# Patient Record
Sex: Female | Born: 1935 | Race: White | Hispanic: No | Smoking: Former smoker
Health system: Southern US, Community
[De-identification: ages and names within clinical notes are randomized; demographics above are authoritative.]

## PROBLEM LIST (undated history)

## (undated) DIAGNOSIS — Z8673 Personal history of transient ischemic attack (TIA), and cerebral infarction without residual deficits: Secondary | ICD-10-CM

## (undated) DIAGNOSIS — H269 Unspecified cataract: Secondary | ICD-10-CM

## (undated) DIAGNOSIS — I714 Abdominal aortic aneurysm, without rupture, unspecified: Secondary | ICD-10-CM

## (undated) DIAGNOSIS — S2232XA Fracture of one rib, left side, initial encounter for closed fracture: Secondary | ICD-10-CM

## (undated) DIAGNOSIS — I10 Essential (primary) hypertension: Secondary | ICD-10-CM

## (undated) DIAGNOSIS — I517 Cardiomegaly: Secondary | ICD-10-CM

## (undated) DIAGNOSIS — M8580 Other specified disorders of bone density and structure, unspecified site: Secondary | ICD-10-CM

## (undated) DIAGNOSIS — Z91013 Allergy to seafood: Secondary | ICD-10-CM

## (undated) DIAGNOSIS — E785 Hyperlipidemia, unspecified: Secondary | ICD-10-CM

## (undated) DIAGNOSIS — F32A Depression, unspecified: Secondary | ICD-10-CM

## (undated) DIAGNOSIS — F0390 Unspecified dementia without behavioral disturbance: Secondary | ICD-10-CM

## (undated) DIAGNOSIS — F329 Major depressive disorder, single episode, unspecified: Secondary | ICD-10-CM

## (undated) DIAGNOSIS — I6529 Occlusion and stenosis of unspecified carotid artery: Secondary | ICD-10-CM

## (undated) DIAGNOSIS — I447 Left bundle-branch block, unspecified: Secondary | ICD-10-CM

## (undated) DIAGNOSIS — I5030 Unspecified diastolic (congestive) heart failure: Secondary | ICD-10-CM

## (undated) DIAGNOSIS — E119 Type 2 diabetes mellitus without complications: Secondary | ICD-10-CM

## (undated) DIAGNOSIS — K219 Gastro-esophageal reflux disease without esophagitis: Secondary | ICD-10-CM

## (undated) HISTORY — DX: Essential (primary) hypertension: I10

## (undated) HISTORY — DX: Left bundle-branch block, unspecified: I44.7

## (undated) HISTORY — PX: TUBAL LIGATION: SHX77

## (undated) HISTORY — DX: Depression, unspecified: F32.A

## (undated) HISTORY — DX: Other specified disorders of bone density and structure, unspecified site: M85.80

## (undated) HISTORY — DX: Fracture of one rib, left side, initial encounter for closed fracture: S22.32XA

## (undated) HISTORY — PX: ANKLE SURGERY: SHX546

## (undated) HISTORY — DX: Abdominal aortic aneurysm, without rupture: I71.4

## (undated) HISTORY — DX: Unspecified diastolic (congestive) heart failure: I50.30

## (undated) HISTORY — DX: Hyperlipidemia, unspecified: E78.5

## (undated) HISTORY — DX: Gastro-esophageal reflux disease without esophagitis: K21.9

## (undated) HISTORY — DX: Allergy to seafood: Z91.013

## (undated) HISTORY — DX: Occlusion and stenosis of unspecified carotid artery: I65.29

## (undated) HISTORY — DX: Unspecified dementia, unspecified severity, without behavioral disturbance, psychotic disturbance, mood disturbance, and anxiety: F03.90

## (undated) HISTORY — DX: Cardiomegaly: I51.7

## (undated) HISTORY — DX: Type 2 diabetes mellitus without complications: E11.9

## (undated) HISTORY — PX: JOINT REPLACEMENT: SHX530

## (undated) HISTORY — DX: Abdominal aortic aneurysm, without rupture, unspecified: I71.40

## (undated) HISTORY — DX: Major depressive disorder, single episode, unspecified: F32.9

## (undated) HISTORY — DX: Personal history of transient ischemic attack (TIA), and cerebral infarction without residual deficits: Z86.73

---

## 2008-03-04 HISTORY — PX: TRACHEOSTOMY: SUR1362

## 2008-10-28 ENCOUNTER — Encounter: Payer: Self-pay | Admitting: Internal Medicine

## 2009-02-28 ENCOUNTER — Encounter: Payer: Self-pay | Admitting: Internal Medicine

## 2009-04-28 ENCOUNTER — Encounter: Payer: Self-pay | Admitting: Internal Medicine

## 2009-08-04 ENCOUNTER — Encounter: Payer: Self-pay | Admitting: Internal Medicine

## 2009-08-14 ENCOUNTER — Encounter: Payer: Self-pay | Admitting: Internal Medicine

## 2009-09-01 DIAGNOSIS — I6529 Occlusion and stenosis of unspecified carotid artery: Secondary | ICD-10-CM

## 2009-09-01 HISTORY — DX: Occlusion and stenosis of unspecified carotid artery: I65.29

## 2009-09-18 ENCOUNTER — Ambulatory Visit: Payer: Self-pay | Admitting: Internal Medicine

## 2009-09-18 DIAGNOSIS — R5383 Other fatigue: Secondary | ICD-10-CM

## 2009-09-18 DIAGNOSIS — F329 Major depressive disorder, single episode, unspecified: Secondary | ICD-10-CM

## 2009-09-18 DIAGNOSIS — J309 Allergic rhinitis, unspecified: Secondary | ICD-10-CM | POA: Insufficient documentation

## 2009-09-18 DIAGNOSIS — R42 Dizziness and giddiness: Secondary | ICD-10-CM | POA: Insufficient documentation

## 2009-09-18 DIAGNOSIS — F0391 Unspecified dementia with behavioral disturbance: Secondary | ICD-10-CM

## 2009-09-18 DIAGNOSIS — I447 Left bundle-branch block, unspecified: Secondary | ICD-10-CM

## 2009-09-18 DIAGNOSIS — I10 Essential (primary) hypertension: Secondary | ICD-10-CM | POA: Insufficient documentation

## 2009-09-18 DIAGNOSIS — Z8679 Personal history of other diseases of the circulatory system: Secondary | ICD-10-CM | POA: Insufficient documentation

## 2009-09-18 DIAGNOSIS — E119 Type 2 diabetes mellitus without complications: Secondary | ICD-10-CM | POA: Insufficient documentation

## 2009-09-18 DIAGNOSIS — E785 Hyperlipidemia, unspecified: Secondary | ICD-10-CM

## 2009-09-18 DIAGNOSIS — R5381 Other malaise: Secondary | ICD-10-CM

## 2009-09-18 DIAGNOSIS — K219 Gastro-esophageal reflux disease without esophagitis: Secondary | ICD-10-CM | POA: Insufficient documentation

## 2009-09-18 DIAGNOSIS — N959 Unspecified menopausal and perimenopausal disorder: Secondary | ICD-10-CM | POA: Insufficient documentation

## 2009-09-19 LAB — CONVERTED CEMR LAB
AST: 16 units/L (ref 0–37)
Albumin: 3.5 g/dL (ref 3.5–5.2)
Alkaline Phosphatase: 91 units/L (ref 39–117)
Basophils Absolute: 0 10*3/uL (ref 0.0–0.1)
Bilirubin, Direct: 0.1 mg/dL (ref 0.0–0.3)
Calcium, Total (PTH): 9 mg/dL (ref 8.4–10.5)
Calcium: 8.9 mg/dL (ref 8.4–10.5)
Creatinine, Ser: 0.6 mg/dL (ref 0.4–1.2)
Eosinophils Absolute: 0.2 10*3/uL (ref 0.0–0.7)
Folate: 12.9 ng/mL
GFR calc non Af Amer: 104.98 mL/min (ref >60)
Glucose, Bld: 258 mg/dL — ABNORMAL HIGH (ref 70–99)
HDL: 39.2 mg/dL (ref >39.00)
Hemoglobin, Urine: NEGATIVE
Hemoglobin: 12 g/dL (ref 12.0–15.0)
LDL Cholesterol: 74 mg/dL (ref 0–99)
Leukocytes, UA: NEGATIVE
Lymphocytes Relative: 26.4 % (ref 12.0–46.0)
MCHC: 34.7 g/dL (ref 30.0–36.0)
Microalb Creat Ratio: 1 mg/g (ref 0.0–30.0)
Monocytes Relative: 9.4 % (ref 3.0–12.0)
Neutro Abs: 4.6 10*3/uL (ref 1.4–7.7)
Neutrophils Relative %: 60.4 % (ref 43.0–77.0)
Nitrite: NEGATIVE
PTH: 46.9 pg/mL (ref 14.0–72.0)
RBC: 3.85 M/uL — ABNORMAL LOW (ref 3.87–5.11)
RDW: 14.1 % (ref 11.5–14.6)
Saturation Ratios: 12.8 % — ABNORMAL LOW (ref 20.0–50.0)
Sodium: 138 meq/L (ref 135–145)
TSH: 1.76 microintl units/mL (ref 0.35–5.50)
Total CHOL/HDL Ratio: 4
Transferrin: 206.5 mg/dL — ABNORMAL LOW (ref 212.0–360.0)
Urobilinogen, UA: 0.2 (ref 0.0–1.0)
VLDL: 32.6 mg/dL (ref 0.0–40.0)
Vitamin B-12: 428 pg/mL (ref 211–911)

## 2009-09-21 ENCOUNTER — Encounter: Admission: RE | Admit: 2009-09-21 | Discharge: 2009-12-04 | Payer: Self-pay | Admitting: Orthopedic Surgery

## 2009-09-27 ENCOUNTER — Telehealth: Payer: Self-pay | Admitting: Internal Medicine

## 2009-10-02 DIAGNOSIS — I517 Cardiomegaly: Secondary | ICD-10-CM

## 2009-10-02 HISTORY — DX: Cardiomegaly: I51.7

## 2009-10-03 ENCOUNTER — Encounter: Payer: Self-pay | Admitting: Internal Medicine

## 2009-10-03 DIAGNOSIS — F411 Generalized anxiety disorder: Secondary | ICD-10-CM | POA: Insufficient documentation

## 2009-10-03 DIAGNOSIS — I714 Abdominal aortic aneurysm, without rupture, unspecified: Secondary | ICD-10-CM | POA: Insufficient documentation

## 2009-10-04 ENCOUNTER — Ambulatory Visit: Payer: Self-pay | Admitting: Cardiovascular Disease

## 2009-10-04 ENCOUNTER — Ambulatory Visit: Payer: Self-pay

## 2009-10-04 ENCOUNTER — Encounter: Payer: Self-pay | Admitting: Internal Medicine

## 2009-10-04 ENCOUNTER — Ambulatory Visit (HOSPITAL_COMMUNITY): Admission: RE | Admit: 2009-10-04 | Discharge: 2009-10-04 | Payer: Self-pay | Admitting: Internal Medicine

## 2009-10-24 ENCOUNTER — Telehealth (INDEPENDENT_AMBULATORY_CARE_PROVIDER_SITE_OTHER): Payer: Self-pay | Admitting: *Deleted

## 2009-10-25 ENCOUNTER — Encounter: Payer: Self-pay | Admitting: Internal Medicine

## 2009-10-25 ENCOUNTER — Telehealth: Payer: Self-pay | Admitting: Internal Medicine

## 2009-10-25 DIAGNOSIS — Z8719 Personal history of other diseases of the digestive system: Secondary | ICD-10-CM

## 2009-10-25 DIAGNOSIS — S82899A Other fracture of unspecified lower leg, initial encounter for closed fracture: Secondary | ICD-10-CM | POA: Insufficient documentation

## 2009-10-25 DIAGNOSIS — R269 Unspecified abnormalities of gait and mobility: Secondary | ICD-10-CM

## 2009-10-26 ENCOUNTER — Ambulatory Visit: Payer: Self-pay | Admitting: Cardiology

## 2009-11-01 ENCOUNTER — Telehealth (INDEPENDENT_AMBULATORY_CARE_PROVIDER_SITE_OTHER): Payer: Self-pay | Admitting: *Deleted

## 2009-11-07 ENCOUNTER — Encounter: Payer: Self-pay | Admitting: Internal Medicine

## 2009-11-08 ENCOUNTER — Encounter (HOSPITAL_COMMUNITY): Admission: RE | Admit: 2009-11-08 | Discharge: 2009-11-23 | Payer: Self-pay | Admitting: Cardiology

## 2009-11-08 ENCOUNTER — Ambulatory Visit: Payer: Self-pay

## 2009-11-08 ENCOUNTER — Ambulatory Visit: Payer: Self-pay | Admitting: Cardiovascular Disease

## 2009-11-08 ENCOUNTER — Encounter: Payer: Self-pay | Admitting: Cardiovascular Disease

## 2009-11-15 ENCOUNTER — Telehealth: Payer: Self-pay | Admitting: Cardiology

## 2009-12-02 DIAGNOSIS — S2232XA Fracture of one rib, left side, initial encounter for closed fracture: Secondary | ICD-10-CM

## 2009-12-02 HISTORY — DX: Fracture of one rib, left side, initial encounter for closed fracture: S22.32XA

## 2009-12-07 ENCOUNTER — Encounter (INDEPENDENT_AMBULATORY_CARE_PROVIDER_SITE_OTHER): Payer: Self-pay | Admitting: *Deleted

## 2009-12-13 ENCOUNTER — Ambulatory Visit: Payer: Self-pay | Admitting: Internal Medicine

## 2009-12-13 DIAGNOSIS — M25569 Pain in unspecified knee: Secondary | ICD-10-CM | POA: Insufficient documentation

## 2009-12-13 DIAGNOSIS — M899 Disorder of bone, unspecified: Secondary | ICD-10-CM | POA: Insufficient documentation

## 2009-12-13 DIAGNOSIS — E559 Vitamin D deficiency, unspecified: Secondary | ICD-10-CM | POA: Insufficient documentation

## 2009-12-13 DIAGNOSIS — M949 Disorder of cartilage, unspecified: Secondary | ICD-10-CM

## 2009-12-13 DIAGNOSIS — R252 Cramp and spasm: Secondary | ICD-10-CM | POA: Insufficient documentation

## 2009-12-14 ENCOUNTER — Telehealth: Payer: Self-pay | Admitting: Internal Medicine

## 2009-12-14 LAB — CONVERTED CEMR LAB
Chloride: 102 meq/L (ref 96–112)
Creatinine, Ser: 0.8 mg/dL (ref 0.4–1.2)
Hgb A1c MFr Bld: 9.8 % — ABNORMAL HIGH (ref 4.6–6.5)
LDL Cholesterol: 80 mg/dL (ref 0–99)
Magnesium: 1.6 mg/dL (ref 1.5–2.5)
Sodium: 137 meq/L (ref 135–145)
Total CHOL/HDL Ratio: 4
Triglycerides: 146 mg/dL (ref 0.0–149.0)

## 2009-12-21 ENCOUNTER — Telehealth: Payer: Self-pay | Admitting: Internal Medicine

## 2009-12-22 ENCOUNTER — Telehealth: Payer: Self-pay | Admitting: Internal Medicine

## 2009-12-23 ENCOUNTER — Emergency Department (HOSPITAL_COMMUNITY): Admission: EM | Admit: 2009-12-23 | Discharge: 2009-12-23 | Payer: Self-pay | Admitting: Emergency Medicine

## 2009-12-27 ENCOUNTER — Telehealth (INDEPENDENT_AMBULATORY_CARE_PROVIDER_SITE_OTHER): Payer: Self-pay | Admitting: *Deleted

## 2010-01-01 ENCOUNTER — Ambulatory Visit: Payer: Self-pay | Admitting: Internal Medicine

## 2010-01-01 DIAGNOSIS — S2239XA Fracture of one rib, unspecified side, initial encounter for closed fracture: Secondary | ICD-10-CM | POA: Insufficient documentation

## 2010-01-01 DIAGNOSIS — N390 Urinary tract infection, site not specified: Secondary | ICD-10-CM

## 2010-01-01 LAB — CONVERTED CEMR LAB
Leukocytes, UA: NEGATIVE
Nitrite: NEGATIVE
Specific Gravity, Urine: >=1.03 (ref 1.000–1.030)
Urobilinogen, UA: 1 (ref 0.0–1.0)

## 2010-01-02 ENCOUNTER — Encounter: Payer: Self-pay | Admitting: Internal Medicine

## 2010-01-05 ENCOUNTER — Encounter: Payer: Self-pay | Admitting: Internal Medicine

## 2010-01-08 ENCOUNTER — Encounter: Payer: Self-pay | Admitting: Internal Medicine

## 2010-01-22 ENCOUNTER — Telehealth: Payer: Self-pay | Admitting: Internal Medicine

## 2010-01-29 ENCOUNTER — Telehealth: Payer: Self-pay | Admitting: Internal Medicine

## 2010-03-02 ENCOUNTER — Ambulatory Visit
Admission: RE | Admit: 2010-03-02 | Discharge: 2010-03-02 | Payer: Self-pay | Source: Home / Self Care | Attending: Internal Medicine | Admitting: Internal Medicine

## 2010-03-02 DIAGNOSIS — R1011 Right upper quadrant pain: Secondary | ICD-10-CM

## 2010-03-06 LAB — CONVERTED CEMR LAB
ALT: 16 units/L (ref 0–35)
AST: 6 units/L (ref 0–37)
Albumin: 3.6 g/dL (ref 3.5–5.2)
Alkaline Phosphatase: 88 units/L (ref 39–117)
BUN: 9 mg/dL (ref 6–23)
Basophils Relative: 0.5 % (ref 0.0–3.0)
CO2: 27 meq/L (ref 19–32)
Chloride: 102 meq/L (ref 96–112)
Creatinine, Ser: 0.6 mg/dL (ref 0.4–1.2)
Eosinophils Relative: 3.4 % (ref 0.0–5.0)
Lipase: 32 units/L (ref 11.0–59.0)
Lymphocytes Relative: 22.9 % (ref 12.0–46.0)
Lymphs Abs: 2.3 10*3/uL (ref 0.7–4.0)
MCHC: 33.3 g/dL (ref 30.0–36.0)
MCV: 89.9 fL (ref 78.0–100.0)
Monocytes Relative: 8.6 % (ref 3.0–12.0)
Neutro Abs: 6.5 10*3/uL (ref 1.4–7.7)
Platelets: 385 10*3/uL (ref 150.0–400.0)
RDW: 14.1 % (ref 11.5–14.6)

## 2010-03-12 ENCOUNTER — Encounter
Admission: RE | Admit: 2010-03-12 | Discharge: 2010-03-12 | Payer: Self-pay | Source: Home / Self Care | Attending: Internal Medicine | Admitting: Internal Medicine

## 2010-03-15 ENCOUNTER — Ambulatory Visit: Admit: 2010-03-15 | Payer: Self-pay | Admitting: Internal Medicine

## 2010-03-26 ENCOUNTER — Ambulatory Visit
Admission: RE | Admit: 2010-03-26 | Discharge: 2010-03-26 | Payer: Self-pay | Source: Home / Self Care | Attending: Internal Medicine | Admitting: Internal Medicine

## 2010-04-03 NOTE — Letter (Signed)
Summary: Milana Obey MD  Milana Obey MD   Imported By: Lester Dowell 10/10/2009 10:42:31  _____________________________________________________________________  External Attachment:    Type:   Image     Comment:   External Document

## 2010-04-03 NOTE — Progress Notes (Signed)
  Phone Note Other Incoming   Request: Send information Summary of Call: Records received from [facility]. 158 pages forwarded to Dr. Jonny Ruiz for review.

## 2010-04-03 NOTE — Progress Notes (Signed)
Summary: Pls Advise  Phone Note Call from Patient Call back at Home Phone [phone]   Caller: Daughter Summary of Call: Pt's daughter called to inform MD that pt has been having more recurrent falls, visual hallucinations of dead relatives and increased confusion and agitation. Pt has appt with MD 11/11 @9a  but daughter is concerned and is requesting advisement from MD. Initial call taken by: Margaret Pyle, CMA,  December 27, 2009 10:02 AM  Follow-up for Phone Call        should have OV - note to lucy to help arrange Follow-up by: Corwin Levins MD,  December 27, 2009 1:12 PM  Additional Follow-up for Phone Call Additional follow up Details #1::        ask scheduler to work her into [facility] schedule sooner than 11/11 to help with this - thanks Additional Follow-up by: Newt Lukes MD,  December 27, 2009 1:27 PM    Additional Follow-up for Phone Call Additional follow up Details #2::    Appt sched w/daughter for pt for 10/31 @ 11:15am. Follow-up by: Verdell Face,  December 27, 2009 2:49 PM

## 2010-04-03 NOTE — Letter (Signed)
Summary: Primary Care Appointment Letter  [facility]  [address] [place], [place] [address]   Phone: [phone]  Fax: [phone]    12/07/2009 MRN: [id number]  Alicia Kent [address] [place], [place]  [address]  Dear Alicia Kent,   Your Primary Care Physician Corwin Levins MD has indicated that:    _______it is time to schedule an appointment.    _______you missed your appointment on______ and need to call and          reschedule.    _______you need to have lab work done.    _______you need to schedule an appointment discuss lab or test results.    __XX _you need to call to reschedule your appointment that is                       scheduled on January 19th @ 11 am.     Please call our office as soon as possible. Our phone number is [phone] option #1. Please press option 1. Our office is open 8a-12noon and 1p-5p, Monday through Friday.     Thank you,    [facility]

## 2010-04-03 NOTE — Progress Notes (Signed)
Summary: does pt need appt on9/26  Phone Note Call from Patient Call back at Home Phone [phone]   Caller: Daughter- tracy Reason for Call: Talk to Nurse Summary of Call: Does pt still need appt on 9/26 with Dr. Shirlee Latch. pt was given lab results.  Initial call taken by: Lorne Skeens,  November 15, 2009 11:45 AM  Follow-up for Phone Call        talked with daughter--pt will keep appt with Dr Shirlee Latch 11/27/09

## 2010-04-03 NOTE — Letter (Signed)
Summary: Consult/[facility]  Consult/[facility]   Imported By: Sherian Rein 10/30/2009 12:06:14  _____________________________________________________________________  External Attachment:    Type:   Image     Comment:   External Document

## 2010-04-03 NOTE — Progress Notes (Signed)
  Phone Note Refill Request Message from:  Fax from Pharmacy on January 29, 2010 12:08 PM  Refills Requested: Medication #1:  NIFEDIPINE 30 MG XR24H-TAB 1 by mouth once daily   Dosage confirmed as above?Dosage Confirmed   Last Refilled: 09/27/2009   Notes: [facility] Initial call taken by: Zella Ball Ewing CMA Duncan Dull),  January 29, 2010 12:09 PM    Prescriptions: NIFEDIPINE 30 MG XR24H-TAB (NIFEDIPINE) 1 by mouth once daily  #90 x 3   Entered by:   Scharlene Gloss CMA (AAMA)   Authorized by:   Corwin Levins MD   Signed by:   Scharlene Gloss CMA (AAMA) on 01/29/2010   Method used:   Faxed to ...       [facility] [phone]* (retail)       [address]       [address]       [place], [place]  [address]       Ph: [phone]       Fax: [phone]   RxID:   [id number]

## 2010-04-03 NOTE — Miscellaneous (Signed)
Summary: Bone Density  Clinical Lists Changes  Orders: Added new Test order of T-Lumbar Vertebral Assessment (77082) - Signed 

## 2010-04-03 NOTE — Miscellaneous (Signed)
  Clinical Lists Changes  Problems: Added new problem of CLOSTRIDIUM DIFFICILE COLITIS, HX OF (ICD-V12.79) Added new problem of FRACTURE, ANKLE, RIGHT (ICD-824.8) Added new problem of GAIT DISTURBANCE (ICD-781.2) Observations: Added new observation of PMH REVIEWED: reviewed - no changes required (10/25/2009 8:58) Added new observation of PSH REVIEWED: reviewed - no changes required (10/25/2009 8:58)       Past History:  Past Medical History: Reviewed history from 09/18/2009 and no changes required. Diabetes mellitus, type II Hyperlipidemia Hypertension GERD Allergic rhinitis Depression Dementia Cerebrovascular accident, hx of - no weakness residual LBBB  Past Surgical History: Reviewed history from 09/18/2009 and no changes required. s/p right ankle surgury - by ortho in [place] hx of tracheostomy after anaphylactic episode on vent- 2010 Tubal ligation

## 2010-04-03 NOTE — Progress Notes (Signed)
  Phone Note Refill Request Message from:  Fax from Pharmacy on December 14, 2009 11:52 AM  Refills Requested: Medication #1:  LANTUS SOLOSTAR 100 UNIT/ML SOLN 40 units at bedtime   Dosage confirmed as above?Dosage Confirmed   Notes: [facility] Initial call taken by: Robin Ewing CMA Duncan Dull),  December 14, 2009 11:53 AM    Prescriptions: LANTUS SOLOSTAR 100 UNIT/ML SOLN (INSULIN GLARGINE) 40 units at bedtime  ##3 mths. x 3   Entered by:   Scharlene Gloss CMA (AAMA)   Authorized by:   Corwin Levins MD   Signed by:   Scharlene Gloss CMA (AAMA) on 12/14/2009   Method used:   Faxed to ...       [facility] (mail-order)             , [place]         Ph: [phone]       Fax: [phone]   RxID:   [id number]

## 2010-04-03 NOTE — Progress Notes (Signed)
Summary: Rx refill req  Phone Note Refill Request Message from:  Patient on October 25, 2009 1:35 PM  Refills Requested: Medication #1:  LANTUS SOLOSTAR 100 UNIT/ML SOLN 30 units at bedtime   Dosage confirmed as above?Dosage Confirmed   Supply Requested: 1 year  Medication #2:  BD PEN NEEDLE ULTRAFINE 29G X 12.7MM MISC Use as directed   Dosage confirmed as above?Dosage Confirmed   Supply Requested: 1 year  Method Requested: Fax to [facility] Initial call taken by: Margaret Pyle, CMA,  October 25, 2009 1:36 PM    Prescriptions: BD PEN NEEDLE ULTRAFINE 29G X 12.7MM MISC (INSULIN PEN NEEDLE) Use as directed  #100 x 3   Entered by:   Margaret Pyle, CMA   Authorized by:   Corwin Levins MD   Signed by:   Margaret Pyle, CMA on 10/25/2009   Method used:   Faxed to ...       [facility] (mail-order)             , [place]         Ph: [phone]       Fax: [phone]   RxID:   [id number] LANTUS SOLOSTAR 100 UNIT/ML SOLN (INSULIN GLARGINE) 30 units at bedtime  #70mth x 3   Entered by:   Margaret Pyle, CMA   Authorized by:   Corwin Levins MD   Signed by:   Margaret Pyle, CMA on 10/25/2009   Method used:   Faxed to ...       [facility] (mail-order)             , [place]         Ph: [phone]       Fax: [phone]   RxID:   [id number]

## 2010-04-03 NOTE — Assessment & Plan Note (Signed)
Summary: np6/LBBB/jml   History of Present Illness: 77 yo with history of HTN, diabetes, prior stroke, and mild dementia presents for cardiology evaluation. She was noted recently by her PCP to have a LBBB.  As mentioned, she has a number of risk factors for CAD.  Besides DM and HTN, she also smokes and has high cholesterol.  She is walking with a walker due to recent right ankle fracture.  She denies any chest pain.  She is short of breath after walking about 50 feet and thinks this is related to using the walker.  No orthopnea or PND.  She is starting to have some short-term memory problems (relatively mild for now) and lives with her daughter.  Echo done this month showed moderate LVH with preserved overall systolic function.  Carotids showed moderate RICA stenosis.   Labs (7/11): K 4.2, creatinine 0.6, LDL 74, HDL 39, TSH normal, LFTs normal  ECG: NSR, LBBB  Current Medications (verified): 1)  Labetalol Hcl 100 Mg Tabs (Labetalol Hcl) .Marland Kitchen.. 1 By Mouth Every 12 Hours 2)  Citalopram Hydrobromide 20 Mg Tabs (Citalopram Hydrobromide) .Marland Kitchen.. 1 By Mouth Once Daily 3)  Famotidine 40 Mg Tabs (Famotidine) .Marland Kitchen.. 1 By Mouth At Bedtime 4)  Lantus Solostar 100 Unit/ml Soln (Insulin Glargine) .... 30 Units At Bedtime 5)  Bd Pen Needle Ultrafine 29g X 12.3mm Misc (Insulin Pen Needle) .... Use As Directed 6)  Nifedipine 30 Mg Xr24h-Tab (Nifedipine) .Marland Kitchen.. 1 By Mouth Once Daily 7)  Simvastatin 40 Mg Tabs (Simvastatin) .Marland Kitchen.. 1 By Mouth Once Daily 8)  Alendronate Sodium 70 Mg Tabs (Alendronate Sodium) .Marland Kitchen.. 1 By Mouth Q Wk  Allergies (verified): 1)  ! Ace Inhibitors 2)  ! Ibuprofen  Past History:  Past Medical History: 1. Diabetes mellitus, type II 2. Hyperlipidemia 3. Hypertension 4. GERD 5. Allergic rhinitis 6. Depression 7. Dementia: mild.  8. History of CVA 9. LBBB 10. Small AAA: last checked in 3/11, due for repeat in 3/12.  11. Carotid stenosis: Carotid dopplers in 7/11 showed 40-59% RICA and  0-39% LICA stenosis. 12. Echo (8/11): Moderate LVH, EF 55-60%, no regional wall motion abnormalities.  13. Shellfish allergy: anaphylaxis  Family History: Reviewed history from 10/25/2009 and no changes required. sister with RA brother with stroke, HTN 2 sibs with DM  Social History: Reviewed history from 09/18/2009 and no changes required. 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   Vital Signs:  Patient profile:   77 year old female Height:      66 inches Weight:      181 pounds BMI:     29.32 Pulse rate:   76 / minute Resp:     16 per minute BP sitting:   121 / 68  (left arm)  Vitals Entered By: Marrion Coy, CNA (October 26, 2009 11:19 AM)  Physical Exam  General:  Well developed, well nourished, in no acute distress. Head:  normocephalic and atraumatic Nose:  no deformity, discharge, inflammation, or lesions Mouth:  Teeth, gums and palate normal. Oral mucosa normal. Neck:  Neck supple, no JVD. No masses, thyromegaly or abnormal cervical nodes. Lungs:  Clear bilaterally to auscultation and percussion. Heart:  Non-displaced PMI, chest non-tender; regular rate and rhythm, S1, S2  without murmurs, rubs or gallops. Carotid upstroke normal, no bruit. Pedals normal pulses. No edema, no varicosities. Abdomen:  Bowel sounds positive; abdomen soft and non-tender without masses, organomegaly, or hernias noted. No hepatosplenomegaly. Msk:  Walking with walker (recovering from ankle fracture.  Extremities:  No clubbing or cyanosis. Neurologic:  Alert and oriented x 3.  Some difficulty with short-term memory.  Skin:  Intact without lesions or rashes. Psych:  Normal affect.   Impression & Recommendations:  Problem # 1:  CARDIOVASCULAR RISK Patient has a  number of risk factors for cardiac events: HTN, DM, hyperlipidemia, smoking, known vascular disease (history of stroke and moderate carotid stenosis). She also has a LBBB ( not sure how new this is).  She denies chest pain but does have some exertional dyspnea.  Recent echo showed normal EF with moderate LVH. Given the risk factors and LBBB, I am going to risk stratify her with an adenosine myoview.  She will start ASA 81 mg daily.   Problem # 2:  AAA (ICD-441.4) Small AAA.  Repeat abdominal US in 3/12.   Problem # 3:  HYPERLIPIDEMIA (ICD-272.4) Known vascular disease (carotid stenosis).  Would aim for LDL < 70.  She was close to goal when lipids last checked.   Problem # 4:  CAROTID STENOSIS Moderate RICA stenosis (40-59%).  Repeat study in 2/12.   Other Orders: Nuclear Stress Test (Nuc Stress Test)  Patient Instructions: 1)  Your physician has requested that you have an adenosine myoview.  For further information please visit [email or web address]  Please follow instruction sheet, as given. 2)  Start Aspirin 81mg  daily--this should be buffered or coated. 3)  Your physician recommends that you schedule a follow-up appointment in: 1 month with Dr Shirlee Latch.

## 2010-04-03 NOTE — Progress Notes (Signed)
Summary: Nuclear Pre-Procedure     Nuclear Med Background Indications for Stress Test: Evaluation for Ischemia   History: Echo  History Comments: 10/04/09 ECHO NL  mod.  LVH  small AAA 03/11  Symptoms: DOE    Nuclear Pre-Procedure Cardiac Risk Factors: Carotid Disease, CVA, Hypertension, IDDM Type 2, LBBB, Lipids, PVD, Smoker Height (in): 66  Nuclear Med Study 1 or 2 day study:  1 day     Referring MD:  D.McLean

## 2010-04-03 NOTE — Letter (Signed)
Summary: [facility]   Imported By: Sherian Rein 11/28/2009 11:37:14  _____________________________________________________________________  External Attachment:    Type:   Image     Comment:   External Document

## 2010-04-03 NOTE — Progress Notes (Signed)
Phone Note Refill Request Message from:  Fax from Pharmacy on September 27, 2009 8:10 AM  Refills Requested: Medication #1:  LABETALOL HCL 100 MG TABS 1 by mouth every 12 hours   Dosage confirmed as above?Dosage Confirmed   Notes: [facility]  Medication #2:  SIMVASTATIN 40 MG TABS 1 by mouth once daily   Dosage confirmed as above?Dosage Confirmed   Notes: [facility]  Medication #3:  CITALOPRAM HYDROBROMIDE 20 MG TABS 1 by mouth once daily   Dosage confirmed as above?Dosage Confirmed   Notes: [facility]  Medication #4:  NIFEDIPINE 30 MG XR24H-TAB 1 by mouth once daily   Dosage confirmed as above?Dosage Confirmed   Notes: [facility] Initial call taken by: Robin Ewing CMA Duncan Dull),  September 27, 2009 8:11 AM    Prescriptions: FAMOTIDINE 40 MG TABS (FAMOTIDINE) 1 by mouth at bedtime  #90 x 3   Entered by:   Scharlene Gloss CMA (AAMA)   Authorized by:   Corwin Levins MD   Signed by:   Scharlene Gloss CMA (AAMA) on 09/27/2009   Method used:   [facility]       [facility]             , [place]         Ph:        Fax: [phone]   RxID:   [id number] SIMVASTATIN 40 MG TABS (SIMVASTATIN) 1 by mouth once daily  #90 x 3   Entered by:   Zella Ball Ewing CMA (AAMA)   Authorized by:   Corwin Levins MD   Signed by:   Scharlene Gloss CMA (AAMA) on 09/27/2009   Method used:   [facility]ed [facility] ...       [facility]             , [place]         Ph:        Fax: [phone]   RxID:   [id number] NIFEDIPINE 30 MG XR24H-TAB (NIFEDIPINE) 1 by mouth once daily  #90 x 3   Entered by:   Zella Ball Ewing CMA (AAMA)   Authorized by:   Corwin Levins MD   Signed by:   Scharlene Gloss CMA (AAMA) on 09/27/2009   Method used:   [facility]ed [facility] ...       [facility]             , [place]         Ph:        Fax: [phone]   RxID:   [id number] CITALOPRAM HYDROBROMIDE 20 MG TABS (CITALOPRAM HYDROBROMIDE) 1 by mouth once daily  #90 x 3   Entered by:   Zella Ball Ewing CMA (AAMA)   Authorized by:    Corwin Levins MD   Signed by:   Scharlene Gloss CMA (AAMA) on 09/27/2009   Method used:   [facility]ed [facility] ...       [facility]             , [place]         Ph:        Fax: [phone]   RxID:   [id number] LABETALOL HCL 100 MG TABS (LABETALOL HCL) 1 by mouth every 12 hours  #90 x 3   Entered by:   Zella Ball Ewing CMA (AAMA)   Authorized by:   Corwin Levins MD   Signed by:   Scharlene Gloss CMA (AAMA) on 09/27/2009  Method used:   [facility]ed [facility] ...       [facility]             , [place]         Ph:        Fax: [phone]   RxID:   [id number]

## 2010-04-03 NOTE — Assessment & Plan Note (Signed)
Summary: see phone note EMR/work in sooner per phone note/cd   Vital Signs:  Patient profile:   77 year old female Height:      66 inches Weight:      180 pounds BMI:     29.16 O2 Sat:      98 % on Room air Temp:     97.9 degrees F oral Pulse rate:   84 / minute BP sitting:   120 / 62  (left arm) Cuff size:   regular  Vitals Entered By: Zella Ball Ewing CMA Duncan Dull) (January 01, 2010 11:43 AM)  O2 Flow:  Room air CC: ER followup/RE   CC:  ER followup/RE.  History of Present Illness: here for f/u; last seen here oct 12 with elev a1c - lantus incr to 40 units, and started metformin 500 once daily which she is tolerating well;   then seen in ER oct 22 after fall and left rib fx with confusion/hallucination -   ct head and maxilfac neg for acute,  labs neg except for glc 140;  urine cx with 85K colonies but ? contaminated; has been tx for UTI since then with bactrim ds two times a day; pt and duaghter denies GU symptoms such as dysuria, fever, abd pain, presure, urgency, freq or blood;  confusion persists and in fact has had recurring hallucination despite good complaicne with citalopram for months;  pt with significant dementia and duaghter now asking for treatment and behavior control due to agitation and psychosis;  pt not aggressive or abusive to others.  Pt denies CP, worsening sob, doe, wheezing, orthopnea, pnd, worsening LE edema, palps, dizziness or syncope  Pt denies new neuro symptoms such as headache, facial or extremity weakness Pt denies polydipsia, polyuria, or low sugar symptoms such as shakiness improved with eating.  Overall good compliance with meds, trying to follow low chol, DM diet, wt stable, little excercise however   Problems Prior to Update: 1)  Uti  (ICD-599.0) 2)  Fracture, Rib, Left  (ICD-807.00) 3)  Cramp in Limb  (ICD-729.82) 4)  Knee Pain, Left, Acute  (ICD-719.46) 5)  Vitamin D Deficiency  (ICD-268.9) 6)  Osteopenia  (ICD-733.90) 7)  Lbbb  (ICD-426.3) 8)  Gait  Disturbance  (ICD-781.2) 9)  Fracture, Ankle, Right  (ICD-824.8) 10)  Clostridium Difficile Colitis, Hx of  (ICD-V12.79) 11)  Aaa  (ICD-441.4) 12)  Anxiety  (ICD-300.00) 13)  Orthostatic Dizziness  (ICD-780.4) 14)  Lbbb  (ICD-426.3) 15)  Dizziness  (ICD-780.4) 16)  Fatigue  (ICD-780.79) 17)  Preventive Health Care  (ICD-V70.0) 18)  Cerebrovascular Accident, Hx of  (ICD-V12.50) 19)  Menopausal Disorder  (ICD-627.9) 20)  Dementia  (ICD-294.8) 21)  Depression  (ICD-311) 22)  Allergic Rhinitis  (ICD-477.9) 23)  Gerd  (ICD-530.81) 24)  Hypertension  (ICD-401.9) 25)  Hyperlipidemia  (ICD-272.4) 26)  Diabetes Mellitus, Type II  (ICD-250.00)  Medications Prior to Update: 1)  Labetalol Hcl 100 Mg Tabs (Labetalol Hcl) .Marland Kitchen.. 1 By Mouth Every 12 Hours 2)  Citalopram Hydrobromide 20 Mg Tabs (Citalopram Hydrobromide) .Marland Kitchen.. 1 By Mouth Once Daily 3)  Famotidine 40 Mg Tabs (Famotidine) .Marland Kitchen.. 1 By Mouth At Bedtime 4)  Lantus Solostar 100 Unit/ml Soln (Insulin Glargine) .... 40 Units At Bedtime 5)  Bd Pen Needle Ultrafine 29g X 12.71mm Misc (Insulin Pen Needle) .... Use As Directed 6)  Nifedipine 30 Mg Xr24h-Tab (Nifedipine) .Marland Kitchen.. 1 By Mouth Once Daily 7)  Simvastatin 40 Mg Tabs (Simvastatin) .Marland Kitchen.. 1 By Mouth Once Daily 8)  Alendronate Sodium 70 Mg Tabs (Alendronate Sodium) .Marland Kitchen.. 1 By Mouth Q Wk 9)  Aspir-Low 81 Mg Tbec (Aspirin) .... One Daily 10)  Hydrocodone-Acetaminophen 7.5-325 Mg Tabs (Hydrocodone-Acetaminophen) .Marland Kitchen.. 1po Q 6 Hrs As Needed Pain 11)  Flexeril 5 Mg Tabs (Cyclobenzaprine Hcl) .Marland Kitchen.. 1po Three Times A Day As Needed Cramp 12)  Metformin Hcl 500 Mg Tabs (Metformin Hcl) .Marland Kitchen.. 1po Qam  Current Medications (verified): 1)  Labetalol Hcl 100 Mg Tabs (Labetalol Hcl) .Marland Kitchen.. 1 By Mouth Every 12 Hours 2)  Citalopram Hydrobromide 20 Mg Tabs (Citalopram Hydrobromide) .Marland Kitchen.. 1 By Mouth Once Daily 3)  Famotidine 40 Mg Tabs (Famotidine) .Marland Kitchen.. 1 By Mouth At Bedtime 4)  Lantus Solostar 100 Unit/ml Soln (Insulin  Glargine) .... 40 Units At Bedtime 5)  Bd Pen Needle Ultrafine 29g X 12.52mm Misc (Insulin Pen Needle) .... Use As Directed 6)  Nifedipine 30 Mg Xr24h-Tab (Nifedipine) .Marland Kitchen.. 1 By Mouth Once Daily 7)  Simvastatin 40 Mg Tabs (Simvastatin) .Marland Kitchen.. 1 By Mouth Once Daily 8)  Alendronate Sodium 70 Mg Tabs (Alendronate Sodium) .Marland Kitchen.. 1 By Mouth Q Wk 9)  Aspir-Low 81 Mg Tbec (Aspirin) .... One Daily 10)  Hydrocodone-Acetaminophen 7.5-325 Mg Tabs (Hydrocodone-Acetaminophen) .Marland Kitchen.. 1po Q 6 Hrs As Needed Pain 11)  Flexeril 5 Mg Tabs (Cyclobenzaprine Hcl) .Marland Kitchen.. 1po Three Times A Day As Needed Cramp 12)  Metformin Hcl 500 Mg Tabs (Metformin Hcl) .Marland Kitchen.. 1po Qam 13)  Aricept 10 Mg Tabs (Donepezil Hcl) .... Generic - 1 By Mouth Once Daily 14)  Risperdal 0.5 Mg Tabs (Risperidone) .... 1/2 By Mouth in The Am, and 1 By Mouth Qhs  Allergies (verified): 1)  ! Ace Inhibitors 2)  ! 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 Diabetes mellitus, type II 2. Hyperlipidemia 3. Hypertension 4. GERD 5. Allergic rhinitis 6. Depression 7. Dementia: mild.  8. History of CVA - small lacunar right 9. LBBB 10. Small AAA: last checked in 3/11, due for repeat in 3/12.  11. Carotid stenosis: Carotid dopplers in 7/11 showed 40-59% RICA and 0-39% LICA stenosis. 12. Echo (8/11): Moderate LVH, EF 55-60%, no regional wall motion abnormalities.  13. Shellfish allergy: anaphylaxis Osteopenia Vit D deficiency left rib fx with fall oct 2011  Past Surgical History: Reviewed history from 09/18/2009 and no changes required. s/p right ankle surgury - by ortho in [place] hx of tracheostomy after anaphylactic episode on vent- 2010 Tubal  ligation  Review of Systems       all otherwise negative per pt -    Physical Exam  General:  alert and overweight-appearing but mild   Head:  normocephalic and atraumatic.   Eyes:  vision grossly intact, pupils equal, and pupils round.   Ears:  R ear normal and L ear normal.   Nose:  no external deformity and no nasal discharge.   Mouth:  no gingival abnormalities and pharynx pink and moist.   Neck:  supple and no masses.   Lungs:  normal respiratory effort and normal breath sounds.   Heart:  normal rate and regular rhythm.   Abdomen:  soft, non-tender, and normal bowel sounds.   Msk:  no flank tender, contusion noted to right temple area Extremities:  no edema, no  erythema  Neurologic:  pleasantly demented, not currently agitated or hallucinating Psych:  not anxious appearing and not depressed appearing.     Impression & Recommendations:  Problem # 1:  UTI (ICD-599.0)  to finish current bactrim - ok to check f/u urine studies  Orders: T-Culture, Urine ([id number]) TLB-Udip w/ Micro (81001-URINE)  Problem # 2:  FRACTURE, RIB, LEFT (ICD-807.00) pain ok per pt - no new meds today  Problem # 3:  DEMENTIA (ICD-294.8) with psychosis - recent CT neg for acute,  little to be gained with further labs or MRI - ok for aricept, and risperdone asd   Problem # 4:  DEPRESSION (ICD-311)  Her updated medication list for this problem includes:    Citalopram Hydrobromide 20 Mg Tabs (Citalopram hydrobromide) .Marland Kitchen... 1 by mouth once daily stable overall by hx and exam, ok to continue meds/tx as is -   Complete Medication List: 1)  Labetalol Hcl 100 Mg Tabs (Labetalol hcl) .Marland Kitchen.. 1 by mouth every 12 hours 2)  Citalopram Hydrobromide 20 Mg Tabs (Citalopram hydrobromide) .Marland Kitchen.. 1 by mouth once daily 3)  Famotidine 40 Mg Tabs (Famotidine) .Marland Kitchen.. 1 by mouth at bedtime 4)  Lantus Solostar 100 Unit/ml Soln (Insulin glargine) .... 40 units at bedtime 5)  Bd Pen Needle Ultrafine 29g X 12.32mm Misc  (Insulin pen needle) .... Use as directed 6)  Nifedipine 30 Mg Xr24h-tab (Nifedipine) .Marland Kitchen.. 1 by mouth once daily 7)  Simvastatin 40 Mg Tabs (Simvastatin) .Marland Kitchen.. 1 by mouth once daily 8)  Alendronate Sodium 70 Mg Tabs (Alendronate sodium) .Marland Kitchen.. 1 by mouth q wk 9)  Aspir-low 81 Mg Tbec (Aspirin) .... One daily 10)  Hydrocodone-acetaminophen 7.5-325 Mg Tabs (Hydrocodone-acetaminophen) .Marland Kitchen.. 1po q 6 hrs as needed pain 11)  Flexeril 5 Mg Tabs (Cyclobenzaprine hcl) .Marland Kitchen.. 1po three times a day as needed cramp 12)  Metformin Hcl 500 Mg Tabs (Metformin hcl) .Marland Kitchen.. 1po qam 13)  Aricept 10 Mg Tabs (Donepezil hcl) .... Generic - 1 by mouth once daily 14)  Risperdal 0.5 Mg Tabs (Risperidone) .... 1/2 by mouth in the am, and 1 by mouth qhs  Patient Instructions: 1)  Please go to the Lab in the basement for your blood urine tests today  2)  Please take all new medications as prescribed - the 5 mg aricept for the first month, then the 10 mg per day after that (watch for diarrhea) 3)  start the risperidone as presribed for the hallucinations and agitation 4)  Please schedule a follow-up appointment in 2 months or sooner if needed Prescriptions: RISPERDAL 0.5 MG TABS (RISPERIDONE) 1/2 by mouth in the am, and 1 by mouth qhs  #45 x 11   Entered and Authorized by:   Corwin Levins MD   Signed by:   Corwin Levins MD on 01/01/2010   Method used:   Print then Give to Patient   RxID:   [id number] ARICEPT 10 MG TABS (DONEPEZIL HCL) generic - 1 by mouth once daily  #30 x 11   Entered and Authorized by:   Corwin Levins MD   Signed by:   Corwin Levins MD on 01/01/2010   Method used:   Print then Give to Patient   RxID:   [id number] ARICEPT 5 MG TABS (DONEPEZIL HCL) generic - 1 by mouth once daily for the first month  #30 x 0   Entered and Authorized by:   Corwin Levins MD   Signed by:   Corwin Levins MD  on 01/01/2010   Method used:   Print then Give to Patient   RxID:   [id number] ALENDRONATE SODIUM 70 MG  TABS (ALENDRONATE SODIUM) 1 by mouth q wk  #12 x 3   Entered and Authorized by:   Corwin Levins MD   Signed by:   Corwin Levins MD on 01/01/2010   Method used:   Electronically to        [facility] [address] [phone]* (retail)       [address]       [address]       [place], [place]  [address]       Ph: [phone]       Fax: [phone]   RxID:   [id number]    Orders Added: 1)  T-Culture, Urine [[id number]] 2)  TLB-Udip w/ Micro [81001-URINE] 3)  Est. Patient Level IV [29518]

## 2010-04-03 NOTE — Progress Notes (Signed)
Summary: Pharmacy change  Phone Note Call from Patient Call back at Home Phone [phone]   Caller: Patient--daughter/ [name] Call For: Dr Jonny Ruiz Summary of Call: Pt's daughter called to say she has been out of medicine for 4 days. Pt states [facility] on [address] has requested these be filled two times. Famotidine,Simvastatin, Labetolol and Citalopre Initial call taken by: Verdell Face,  January 22, 2010 9:07 AM    Prescriptions: SIMVASTATIN 40 MG TABS (SIMVASTATIN) 1 by mouth once daily  #90 x 1   Entered by:   Margaret Pyle, CMA   Authorized by:   Corwin Levins MD   Signed by:   Margaret Pyle, CMA on 01/22/2010   Method used:   Electronically to        [facility] [address] [phone]* (retail)       [address]       [address]       [place], [place]  [address]       Ph: [phone]       Fax: [phone]   RxID:   [id number] FAMOTIDINE 40 MG TABS (FAMOTIDINE) 1 by mouth at bedtime  #90 x 1   Entered by:   Margaret Pyle, CMA   Authorized by:   Corwin Levins MD   Signed by:   Margaret Pyle, CMA on 01/22/2010   Method used:   Electronically to        [facility] [address] [phone]* (retail)       [address]       [address]       [place], [place]  [address]       Ph: [phone]       Fax: [phone]   RxID:   [id number] CITALOPRAM HYDROBROMIDE 20 MG TABS (CITALOPRAM HYDROBROMIDE) 1 by mouth once daily  #90 x 1   Entered by:   Margaret Pyle, CMA   Authorized by:   Corwin Levins MD   Signed by:   Margaret Pyle, CMA on 01/22/2010   Method used:   Electronically to        [facility] [address] [phone]* (retail)       [address]       [address]       [place], [place]  [address]       Ph: [phone]       Fax: [phone]   RxID:   [id number] LABETALOL HCL 100 MG TABS (LABETALOL HCL) 1 by mouth every 12 hours  #180 x 1   Entered by:   Margaret Pyle, CMA   Authorized by:   Corwin Levins MD   Signed by:   Margaret Pyle, CMA on 01/22/2010   Method used:   Electronically to        [facility] [address] [phone]* (retail)       [address]       [address]       [place], [place]  [address]       Ph: [phone]       Fax: [phone]   RxID:   [id number]

## 2010-04-03 NOTE — Letter (Signed)
Summary: [facility]  [facility]ics[facility]   Imported By: Lester Christopher 01/26/2010 10:14:51  _____________________________________________________________________  External Attachment:    Type:   Image     Comment:   External Document

## 2010-04-03 NOTE — Miscellaneous (Signed)
  Clinical Lists Changes  Problems: Added new problem of ANXIETY (ICD-300.00) Added new problem of AAA (ICD-441.4)  AAA - small by CT feb 2011

## 2010-04-03 NOTE — Progress Notes (Signed)
  Phone Note Call from Patient Call back at Home Phone [phone]   Caller: Daughter Summary of Call: pt [facility]ar  [phone]. Initial call taken by: Lanier Prude, [facility]),  December 22, 2009 2:48 PM    Prescriptions: LANTUS SOLOSTAR 100 UNIT/ML SOLN (INSULIN GLARGINE) 40 units at bedtime  #3 mths. x 3   Entered by:   Lanier Prude, CMA(AAMA)   Authorized by:   Corwin Levins MD   Signed by:   Lanier Prude, [facility]) on 12/22/2009   Method used:   Electronically to        [facility] [address] [phone]* (retail)       [address]       [address]       [place], [place]  [address]       Ph: [phone]       Fax: [phone]   RxID:   [id number] METFORMIN HCL 500 MG TABS (METFORMIN HCL) 1po qam  #90 x 3   Entered by:   Lanier Prude, CMA(AAMA)   Authorized by:   Corwin Levins MD   Signed by:   Lanier Prude, CMA(AAMA) on 12/22/2009   Method used:   Electronically to        [facility] [address] [phone]* (retail)       [address]       [address]       [place], [place]  [address]       Ph: [phone]       Fax: [phone]   RxID:   [id number]

## 2010-04-03 NOTE — Letter (Signed)
Summary: [facility]  [facility]ics/[facility]   Imported By: Lester Addison 01/26/2010 10:16:07  _____________________________________________________________________  External Attachment:    Type:   Image     Comment:   External Document

## 2010-04-03 NOTE — Miscellaneous (Signed)
Summary: Orders Update  Clinical Lists Changes  Orders: Added new Test order of Carotid Duplex (Carotid Duplex) - Signed 

## 2010-04-03 NOTE — Progress Notes (Signed)
Summary: Recent falls/comfusion  Phone Note Call from Patient Call back at Home Phone [phone]   Caller: Daughter-Tracy Summary of Call: Pt daughter [name] left message stating that pt has fallen twice within the last couple of days. She also states that pt has become incoherant at times and is hallucinating-daughter is looking for MD's advisement Initial call taken by: Brenton Grills MA,  December 21, 2009 4:38 PM  Follow-up for Phone Call        I would go to ER with worsening confusion and falls  Follow-up by: Corwin Levins MD,  December 21, 2009 5:29 PM  Additional Follow-up for Phone Call Additional follow up Details #1::        pt's daughter [name] informed Additional Follow-up by: Lanier Prude, [facility]),  December 22, 2009 8:08 AM

## 2010-04-03 NOTE — Assessment & Plan Note (Signed)
Summary: Cardiology Nuclear Testing  Nuclear Med Background Indications for Stress Test: Evaluation for Ischemia   History: Echo  History Comments: 10/04/09 ECHO NL  mod.  LVH  small AAA 03/11  Symptoms: Dizziness, DOE, Light-Headedness, SOB    Nuclear Pre-Procedure Cardiac Risk Factors: Carotid Disease, CVA, Hypertension, IDDM Type 2, LBBB, Lipids, PVD, Smoker Caffeine/Decaff Intake: None NPO After: 7:00 PM Lungs: clear IV 0.9% NS with Angio Cath: 22g     IV Site: R Antecubital IV Started by: Bonnita Levan, RN Chest Size (in) 40     Cup Size D     Height (in): 66 Weight (lb): 178 BMI: 28.83  Nuclear Med Study 1 or 2 day study:  1 day     Stress Test Type:  Adenosine Reading MD:  Charlton Haws, MD     Referring MD:  D.McLean Resting Radionuclide:  Technetium 46m Tetrofosmin     Resting Radionuclide Dose:  11 mCi  Stress Radionuclide:  Technetium 13m Tetrofosmin     Stress Radionuclide Dose:  32.9 mCi   Stress Protocol  Dose of Adenosine:  45.3 mg    Stress Test Technologist:  Milana Na, EMT-P     Nuclear Technologist:  Doyne Keel, CNMT  Rest Procedure  Myocardial perfusion imaging was performed at rest 45 minutes following the intravenous administration of Technetium 74m Tetrofosmin.  Stress Procedure  The patient received IV adenosine at 140 mcg/kg/min for 4 minutes. There were no significant changes with infusion. Technetium 14m Tetrofosmin was injected at the 2 minute mark and quantitative spect images were obtained after a 45 minute delay.  QPS Raw Data Images:  Motion artifact especially on stress images Stress Images:  Normal homogeneous uptake in all areas of the myocardium. Rest Images:  Normal homogeneous uptake in all areas of the myocardium. Subtraction (SDS):  SDS 2 Transient Ischemic Dilatation:  0.99  (Normal <1.22)  Lung/Heart Ratio:  0.27  (Normal <0.45)  Quantitative Gated Spect Images QGS EDV:  84 ml QGS ESV:  30 ml QGS EF:  64 % QGS  cine images:  normal  Findings Low risk nuclear study  Evidence for inferior infarct     Overall Impression  Exercise Capacity: Adenosine study with no exercise. BP Response: Normal blood pressure response. Clinical Symptoms: Warm ECG Impression: LBBB Overall Impression: Small inferoseptal infarct at base no ischemia  Appended Document: Cardiology Nuclear Testing Small fixed basal inferoseptal perfusion defect.  Small MI versus attenuation in setting of LBBB.  Low risk study. Medical management.   Appended Document: Cardiology Nuclear Testing NA  Appended Document: Cardiology Nuclear Testing NA  Appended Document: Cardiology Nuclear Testing NA  Appended Document: Cardiology Nuclear Testing pt given results by telephone

## 2010-04-03 NOTE — Letter (Signed)
Summary: [facility]  [facility]ics/[facility]   Imported By: Sherian Rein 01/19/2010 11:35:45  _____________________________________________________________________  External Attachment:    Type:   Image     Comment:   External Document

## 2010-04-03 NOTE — Miscellaneous (Signed)
Summary: Appointment Canceled  Appointment status changed to canceled by [employer] on 11/02/2009 9:38 AM.  Cancellation Comments --------------------- aden myoview wt 181/[facility] choice care 1/sl  Appointment Information ----------------------- Appt Type:  CARDIOLOGY NUCLEAR TESTING      Date:  Thursday, November 02, 2009      Time:  9:00 AM for 15 min   Urgency:  Routine   Made By:  Pearson Grippe  To Visit:  [id number]    Reason:  aden myoview wt 181/[facility] choice care 1/sl  Appt Comments ------------- -- 11/02/09 9:38: ([facility]) CANCELED -- aden myoview wt 181/[facility] choice care 1/sl -- 11/01/09 9:14: ([facility]) BOOKED -- Routine CARDIOLOGY NUCLEAR TESTING at 11/02/2009 9:00 AM for 15 min aden myoview wt 181/[facility] choice [facility] 1/sl -- 11/01/09 8:46: ([facility]) B

## 2010-04-03 NOTE — Assessment & Plan Note (Signed)
Summary: X 1 MTH--KNEES SWELLING AND HANS CRAMPING/DRAWING DOWN-PER DA...   Vital Signs:  Patient profile:   77 year old female Height:      66 inches Weight:      178 pounds BMI:     28.83 O2 Sat:      94 % on Room air Temp:     98 degrees F oral Pulse rate:   78 / minute BP sitting:   150 / 68  (left arm) Cuff size:   regular  Vitals Entered By: Zella Ball Ewing CMA (AAMA) (December 13, 2009 11:00 AM)  O2 Flow:  Room air CC: Both knees swollen, right wrist pain, no energy/RE   CC:  Both knees swollen, right wrist pain, and no energy/RE.  History of Present Illness: here with family - overall doing ok, walks with cane, and has been going up and down stairs (only 4) but mult times per day for wks to go outside during the day to smoke;  no falls and does not remember twisting the knee but now with 1 wk mod to severe pain to the left knee, fortunately without swelling, but limping very much to get out of bed today and pain approx 10/10 per pt;  also wtih several cramps to both LE's (and occasional hands) but worse to the left thigh and calf;  no swelling or foot or ankle pain; no fever, and Pt denies CP, worsening sob, doe, wheezing, orthopnea, pnd, worsening LE edema, palps, dizziness or syncope  Pt denies new neuro symptoms such as headache, facial or extremity weakness  Pt denies polydipsia, polyuria and despite the increased lantus last visit with better AM sugars, sugars during the day with eating are 200's.    Problems Prior to Update: 1)  Cramp in Limb  (ICD-729.82) 2)  Knee Pain, Left, Acute  (ICD-719.46) 3)  Vitamin D Deficiency  (ICD-268.9) 4)  Osteopenia  (ICD-733.90) 5)  Lbbb  (ICD-426.3) 6)  Gait Disturbance  (ICD-781.2) 7)  Fracture, Ankle, Right  (ICD-824.8) 8)  Clostridium Difficile Colitis, Hx of  (ICD-V12.79) 9)  Aaa  (ICD-441.4) 10)  Anxiety  (ICD-300.00) 11)  Orthostatic Dizziness  (ICD-780.4) 12)  Lbbb  (ICD-426.3) 13)  Dizziness  (ICD-780.4) 14)  Fatigue   (ICD-780.79) 15)  Preventive Health Care  (ICD-V70.0) 16)  Cerebrovascular Accident, Hx of  (ICD-V12.50) 17)  Menopausal Disorder  (ICD-627.9) 18)  Dementia  (ICD-294.8) 19)  Depression  (ICD-311) 20)  Allergic Rhinitis  (ICD-477.9) 21)  Gerd  (ICD-530.81) 22)  Hypertension  (ICD-401.9) 23)  Hyperlipidemia  (ICD-272.4) 24)  Diabetes Mellitus, Type II  (ICD-250.00)  Medications Prior to Update: 1)  Labetalol Hcl 100 Mg Tabs (Labetalol Hcl) .Marland Kitchen.. 1 By Mouth Every 12 Hours 2)  Citalopram Hydrobromide 20 Mg Tabs (Citalopram Hydrobromide) .Marland Kitchen.. 1 By Mouth Once Daily 3)  Famotidine 40 Mg Tabs (Famotidine) .Marland Kitchen.. 1 By Mouth At Bedtime 4)  Lantus Solostar 100 Unit/ml Soln (Insulin Glargine) .... 30 Units At Bedtime 5)  Bd Pen Needle Ultrafine 29g X 12.74mm Misc (Insulin Pen Needle) .... Use As Directed 6)  Nifedipine 30 Mg Xr24h-Tab (Nifedipine) .Marland Kitchen.. 1 By Mouth Once Daily 7)  Simvastatin 40 Mg Tabs (Simvastatin) .Marland Kitchen.. 1 By Mouth Once Daily 8)  Alendronate Sodium 70 Mg Tabs (Alendronate Sodium) .Marland Kitchen.. 1 By Mouth Q Wk 9)  Aspir-Low 81 Mg Tbec (Aspirin) .... One Daily  Current Medications (verified): 1)  Labetalol Hcl 100 Mg Tabs (Labetalol Hcl) .Marland Kitchen.. 1 By Mouth Every 12 Hours 2)  Citalopram Hydrobromide 20 Mg Tabs (Citalopram Hydrobromide) .Marland Kitchen.. 1 By Mouth Once Daily 3)  Famotidine 40 Mg Tabs (Famotidine) .Marland Kitchen.. 1 By Mouth At Bedtime 4)  Lantus Solostar 100 Unit/ml Soln (Insulin Glargine) .... 40 Units At Bedtime 5)  Bd Pen Needle Ultrafine 29g X 12.59mm Misc (Insulin Pen Needle) .... Use As Directed 6)  Nifedipine 30 Mg Xr24h-Tab (Nifedipine) .Marland Kitchen.. 1 By Mouth Once Daily 7)  Simvastatin 40 Mg Tabs (Simvastatin) .Marland Kitchen.. 1 By Mouth Once Daily 8)  Alendronate Sodium 70 Mg Tabs (Alendronate Sodium) .Marland Kitchen.. 1 By Mouth Q Wk 9)  Aspir-Low 81 Mg Tbec (Aspirin) .... One Daily 10)  Hydrocodone-Acetaminophen 7.5-325 Mg Tabs (Hydrocodone-Acetaminophen) .Marland Kitchen.. 1po Q 6 Hrs As Needed Pain 11)  Flexeril 5 Mg Tabs (Cyclobenzaprine  Hcl) .Marland Kitchen.. 1po Three Times A Day As Needed Cramp 12)  Metformin Hcl 500 Mg Tabs (Metformin Hcl) .Marland Kitchen.. 1po Qam  Allergies (verified): 1)  ! Ace Inhibitors 2)  ! 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 Diabetes mellitus, type II 2. Hyperlipidemia 3. Hypertension 4. GERD 5. Allergic rhinitis 6. Depression 7. Dementia: mild.  8. History of CVA 9. LBBB 10. Small AAA: last checked in 3/11, due for repeat in 3/12.  11. Carotid stenosis: Carotid dopplers in 7/11 showed 40-59% RICA and 0-39% LICA stenosis. 12. Echo (8/11): Moderate LVH, EF 55-60%, no regional wall motion abnormalities.  13. Shellfish allergy: anaphylaxis Osteopenia Vit D deficiency  Past Surgical History: Reviewed history from 09/18/2009 and no changes required. s/p right ankle surgury - by ortho in [place] hx of tracheostomy after anaphylactic episode on vent- 2010 Tubal ligation  Review of Systems       all otherwise negative per pt -    Physical Exam  General:  alert and overweight-appearing but mild   Head:  normocephalic and atraumatic.   Eyes:  vision grossly intact, pupils equal, and pupils round.   Ears:  R ear normal and L ear normal.   Nose:  no external deformity and no nasal discharge.   Mouth:  no gingival abnormalities and pharynx pink and moist.   Neck:  supple and no masses.   Lungs:  normal respiratory effort and normal breath sounds.   Heart:  normal rate and regular rhythm.   Msk:  left knee with marked bony changes c/w DJD, mild post warmth only , no effusion, ahs FROM but marked crepitus Pulses:  1+ bilat LE's dorsalis pedis Extremities:  no edema, no erythema  Neurologic:  strength normal in  all extremities and gait normal.     Impression & Recommendations:  Problem # 1:  KNEE PAIN, LEFT, ACUTE (ICD-719.46)  Her updated medication list for this problem includes:    Aspir-low 81 Mg Tbec (Aspirin) ..... One daily    Hydrocodone-acetaminophen 7.5-325 Mg Tabs (Hydrocodone-acetaminophen) .Marland Kitchen... 1po q 6 hrs as needed pain    Flexeril 5 Mg Tabs (Cyclobenzaprine hcl) .Marland Kitchen... 1po three times a day as needed cramp acute on chronic, suspect flare of DJD with incr stair use - for pain control, refer to ortho (has appt incidetnly for this friday with Dr Netty Starring, [facility])  Orders: Orthopedic Surgeon Referral (Ortho Surgeon)  Problem # 2:  DIABETES MELLITUS, TYPE  II (ICD-250.00)  Her updated medication list for this problem includes:    Lantus Solostar 100 Unit/ml Soln (Insulin glargine) .Marland KitchenMarland KitchenMarland KitchenMarland Kitchen 40 units at bedtime    Aspir-low 81 Mg Tbec (Aspirin) ..... One daily    Metformin Hcl 500 Mg Tabs (Metformin hcl) .Marland Kitchen... 1po qam  Orders: TLB-Lipid Panel (80061-LIPID) TLB-BMP (Basic Metabolic Panel-BMET) (80048-METABOL) TLB-A1C / Hgb A1C (Glycohemoglobin) (83036-A1C) stable overall by hx and exam, ok to continue meds/tx as is   Labs Reviewed: Creat: 0.6 (09/18/2009)    Reviewed HgBA1c results: 8.0 (09/18/2009)  Problem # 3:  CRAMP IN LIMB (ICD-729.82)  arms, legs and hands - for mg check ; for flexeril as needed   Orders: TLB-Magnesium (Mg) (83735-MG)  Problem # 4:  HYPERTENSION (ICD-401.9)  Her updated medication list for this problem includes:    Labetalol Hcl 100 Mg Tabs (Labetalol hcl) .Marland Kitchen... 1 by mouth every 12 hours    Nifedipine 30 Mg Xr24h-tab (Nifedipine) .Marland Kitchen... 1 by mouth once daily  BP today: 150/68 Prior BP: 121/68 (10/26/2009)  Labs Reviewed: K+: 4.2 (09/18/2009) Creat: : 0.6 (09/18/2009)   Chol: 146 (09/18/2009)   HDL: 39.20 (09/18/2009)   LDL: 74 (09/18/2009)   TG: 163.0 (09/18/2009) mild elev today, likely situational, ok to follow, continue same treatment    Complete Medication List: 1)  Labetalol Hcl 100 Mg Tabs (Labetalol hcl) .Marland Kitchen.. 1 by mouth every 12 hours 2)  Citalopram Hydrobromide 20 Mg Tabs (Citalopram hydrobromide) .Marland Kitchen.. 1 by mouth once daily 3)  Famotidine 40 Mg Tabs (Famotidine) .Marland Kitchen.. 1 by mouth at bedtime 4)  Lantus Solostar 100 Unit/ml Soln (Insulin glargine) .... 40 units at bedtime 5)  Bd Pen Needle Ultrafine 29g X 12.83mm Misc (Insulin pen needle) .... Use as directed 6)  Nifedipine 30 Mg Xr24h-tab (Nifedipine) .Marland Kitchen.. 1 by mouth once daily 7)  Simvastatin 40 Mg Tabs (Simvastatin) .Marland Kitchen.. 1 by mouth once daily 8)  Alendronate Sodium 70 Mg Tabs (Alendronate sodium) .Marland Kitchen.. 1 by mouth q wk 9)  Aspir-low 81 Mg Tbec (Aspirin) .... One daily 10)  Hydrocodone-acetaminophen 7.5-325 Mg Tabs (Hydrocodone-acetaminophen) .Marland Kitchen.. 1po q 6 hrs as needed pain 11)  Flexeril 5 Mg Tabs (Cyclobenzaprine hcl) .Marland Kitchen.. 1po three times a day as needed cramp 12)  Metformin Hcl 500 Mg Tabs (Metformin hcl) .Marland Kitchen.. 1po qam  Patient Instructions: 1)  Please take all new medications as prescribed  - the pain med for the knee, and the muscle relaxer for the cramps 2)  Please go to the Lab in the basement for your blood and/or urine tests today, including the magnesium level 3)  Please call the number on the [facility] Card for results of your testing  4)  You should also take OTC B complex MVI - 1 per day 5)  Continue all previous medications as before this visit  6)  Please keep your appt with Orthopedic later this wk as planned (we will send the referral for the knee) 7)  Please schedule a follow-up appointment as you have already planned Prescriptions: FLEXERIL 5 MG TABS (CYCLOBENZAPRINE HCL) 1po three times a day as needed cramp  #60 x 1   Entered and Authorized by:   Corwin Levins MD   Signed by:   Corwin Levins MD on 12/13/2009   Method used:   Print then Give to Patient   RxID:   [id number] HYDROCODONE-ACETAMINOPHEN 7.5-325 MG TABS (HYDROCODONE-ACETAMINOPHEN) 1po q 6  hrs as needed pain  #60 x 1   Entered and Authorized by:  Corwin Levins MD   Signed by:   Corwin Levins MD on 12/13/2009   Method used:   Print then Give to Patient   RxID:   [id number]

## 2010-04-03 NOTE — Assessment & Plan Note (Signed)
Summary: NEW PT/ MEDICARE/  NWS  #   Vital Signs:  Patient profile:   77 year old female Height:      66 inches Weight:      180.50 pounds BMI:     29.24 O2 Sat:      95 % on Room air Temp:     98.7 degrees F oral Pulse rate:   79 / minute BP supine:   122 / 64 BP sitting:   118 / 68  (left arm) BP standing:   108 / 62 Cuff size:   large  Vitals Entered By: Zella Ball Ewing CMA (AAMA) (September 18, 2009 1:11 PM)  O2 Flow:  Room air  CC: New patient, New medicare/RE   CC:  New patient and New medicare/RE.  History of Present Illness: here to establish - already saw orhto for right ankle this am - can do wt bearing now and f/u in one month;  never been check for osteoporosis;  Pt denies CP, sob, doe, wheezing, orthopnea, pnd, worsening LE edema, palps, dizziness or syncope  Pt denies new neuro symptoms such as headache, facial or extremity weakness  Pt denies polydipsia, polyuria, or low sugar symptoms such as shakiness improved with eating.  Overall good compliance with meds, trying to follow low chol, DM diet, wt stable, little excercise however Daugter reports she is currently unsteady somwhat to walk, and had some dizziness several occasions in the past wk.  Has walker at home.  Did fall with loss of balance backwards .  No fever, wt loss, night sweats, loss of appetite or other constitutional symptoms   Preventive Screening-Counseling & Management  Alcohol-Tobacco     Smoking Status: current      Drug Use:  no.    Problems Prior to Update: 1)  Orthostatic Dizziness  (ICD-780.4) 2)  Lbbb  (ICD-426.3) 3)  Dizziness  (ICD-780.4) 4)  Fatigue  (ICD-780.79) 5)  Preventive Health Care  (ICD-V70.0) 6)  Cerebrovascular Accident, Hx of  (ICD-V12.50) 7)  Menopausal Disorder  (ICD-627.9) 8)  Dementia  (ICD-294.8) 9)  Depression  (ICD-311) 10)  Allergic Rhinitis  (ICD-477.9) 11)  Gerd  (ICD-530.81) 12)  Hypertension  (ICD-401.9) 13)  Hyperlipidemia  (ICD-272.4) 14)  Diabetes Mellitus,  Type II  (ICD-250.00)  Medications Prior to Update: 1)  None  Current Medications (verified): 1)  Labetalol Hcl 100 Mg Tabs (Labetalol Hcl) .Marland Kitchen.. 1 By Mouth Every 12 Hours 2)  Citalopram Hydrobromide 20 Mg Tabs (Citalopram Hydrobromide) .Marland Kitchen.. 1 By Mouth Once Daily 3)  Famotidine 40 Mg Tabs (Famotidine) .Marland Kitchen.. 1 By Mouth At Bedtime 4)  Lantus Solostar 100 Unit/ml Soln (Insulin Glargine) .... 25 Units At Bedtime 5)  Bd Pen Needle Ultrafine 29g X 12.73mm Misc (Insulin Pen Needle) .... Use As Directed 6)  Nifedipine 30 Mg Xr24h-Tab (Nifedipine) .Marland Kitchen.. 1 By Mouth Once Daily 7)  Simvastatin 40 Mg Tabs (Simvastatin) .Marland Kitchen.. 1 By Mouth Once Daily 8)  Cetirizine Hcl 10 Mg Tabs (Cetirizine Hcl) .Marland Kitchen.. 1 By Mouth Once Daily  Allergies (verified): 1)  ! Ace Inhibitors 2)  ! 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 sister with RA brother with stroke, HTN 2 sibs with DM  Social History: Reviewed history and no changes required. 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         all otherwise negative per pt -  - except for episode last wk of vomiting for 24 hrs   Physical Exam  General:  alert and overweight-appearing but mild   Head:  normocephalic and atraumatic.   Eyes:  vision grossly intact, pupils equal, and pupils round.   Ears:  R ear normal and L ear normal.   Nose:  no external deformity and no nasal discharge.   Mouth:  no gingival abnormalities and pharynx pink and moist.   Neck:  supple and no masses.   Lungs:  normal respiratory effort and normal breath sounds.   Heart:  normal rate and regular rhythm.   Abdomen:  soft, non-tender, and normal bowel sounds.   Msk:  no joint tenderness and no joint swelling.  , does have coccy tender after fall  Extremities:  all otherwise negative per pt -   Neurologic:  cranial nerves II-XII intact, strength normal in all extremities, and gait remarkably good - appears fatigued and wearing the right ankle cast which causes off balance as well  Skin:  color normal and no rashes.   Psych:  not anxious appearing and not depressed appearing.     Impression & Recommendations:  Problem # 1:  Preventive Health Care (ICD-V70.0)  Overall doing well, age appropriate education and counseling updated and referral for appropriate preventive services done unless declined, immunizations up to date or declined, diet counseling done if overweight, urged to quit smoking if smokes , most recent labs reviewed and current ordered if appropriate, ecg reviewed or  declined (interpretation per ECG scanned in the EMR if done); information regarding Medicare Prevention requirements given if appropriate; speciality referrals updated as appropriate   Orders: TLB-BMP (Basic Metabolic Panel-BMET) (80048-METABOL) TLB-CBC Platelet - w/Differential (85025-CBCD) TLB-Hepatic/Liver Function Pnl (80076-HEPATIC) TLB-Lipid Panel (80061-LIPID) TLB-TSH (Thyroid Stimulating Hormone) (84443-TSH) TLB-Udip ONLY (81003-UDIP) EKG w/ Interpretation (93000)  Problem # 2:  MENOPAUSAL DISORDER (ICD-627.9)  to check dxa, PTH, vit d  Orders: T-Parathyroid Hormone, Intact (09811-91478) T-Bone Densitometry (29562)  Problem # 3:  DIABETES MELLITUS, TYPE II (ICD-250.00)  Her updated medication list for this problem includes:    Lantus Solostar 100 Unit/ml Soln (Insulin glargine) .Marland Kitchen... 25 units at bedtime  Orders: TLB-Microalbumin/Creat Ratio, Urine (82043-MALB) TLB-A1C / Hgb A1C (Glycohemoglobin) (83036-A1C) stable overall by hx and exam, ok to continue meds/tx as is - Pt to cont DM diet, excercise, wt loss efforts; to check labs today  Problem # 4:  HYPERTENSION (ICD-401.9)  Her updated medication list for this problem includes:    Labetalol Hcl 100 Mg Tabs (Labetalol hcl) .Marland Kitchen... 1 by mouth every 12 hours    Nifedipine 30 Mg Xr24h-tab (Nifedipine) .Marland Kitchen... 1 by mouth once daily  BP today: 120/60 stable overall by hx and exam, ok to continue meds/tx as is   Problem # 5:  FATIGUE (ICD-780.79)  Orders: TLB-IBC Pnl (Iron/FE;Transferrin) (83550-IBC) TLB-B12 + Folate Pnl (82746_82607-B12/FOL) T-Vitamin D (25-Hydroxy) (29562-13086) mild   for labs above  Problem # 6:  CEREBROVASCULAR ACCIDENT, HX OF (ICD-V12.50) stable overall by hx and exam, ok to continue meds/tx as is   Problem # 7:  LBBB (ICD-426.3)  no hx per pt, duaghter, and limited records - with dizziness will check echo, carotids, and refer cardiology  Orders: Cardiology Referral (Cardiology)  Problem #  8:  ORTHOSTATIC DIZZINESS (ICD-780.4)  Her updated medication list for this problem includes:    Cetirizine Hcl 10 Mg Tabs (Cetirizine hcl) .Marland Kitchen... 1 by mouth once daily  Orders: Cardiology Referral (Cardiology) Echo Referral (Echo) Radiology Referral (Radiology) for more fluids in the next few days - c/w mild dehydration  Complete Medication List: 1)  Labetalol Hcl 100 Mg Tabs (Labetalol hcl) .Marland Kitchen.. 1 by mouth every 12 hours 2)  Citalopram Hydrobromide 20 Mg Tabs (Citalopram hydrobromide) .Marland Kitchen.. 1 by mouth once daily 3)  Famotidine 40 Mg Tabs (Famotidine) .Marland Kitchen.. 1 by mouth at bedtime 4)  Lantus Solostar 100 Unit/ml Soln (Insulin glargine) .... 25 units at bedtime 5)  Bd Pen Needle Ultrafine 29g X 12.55mm Misc (Insulin pen needle) .... Use as directed 6)  Nifedipine 30 Mg Xr24h-tab (Nifedipine) .Marland Kitchen.. 1 by mouth once daily 7)  Simvastatin 40 Mg Tabs (Simvastatin) .Marland Kitchen.. 1 by mouth once daily 8)  Cetirizine Hcl 10 Mg Tabs (Cetirizine hcl) .Marland Kitchen.. 1 by mouth once daily  Other Orders: TD Toxoids IM 7 YR + (57846) Pneumococcal Vaccine (96295) Admin 1st Vaccine (28413) Admin of Any Addtl Vaccine (24401)  Patient Instructions: 1)  you had the pneumonia shot and tetanus shots 2)  Please schedule the bone density test before leaving 3)  Please go to the Lab in the basement for your blood and/or urine tests today 4)  You will be contacted about the referral(s) to: colonoscopy 5)  Please schedule a mammogram  - to consider [facility] on [address], or [facility] on [address]  6)  Please consider giving [employer] engineer for health care and finance to your daughter 7)  Your EKG showed "left bundle branch block" which shows abnormal electrical circulation in the heart -  so we will need to refer to cardiology, and also check Echocardiogram, and Neck arteries 8)  Your Blood Pressures on standing fell somewhat - it appaers you are likely mildly dehydrated -  please drink more fluids in the next few days, and  we will check sugar to make sure this isn't making the problem worse 9)  Please schedule a follow-up appointment in 6 months ( or sooner if needed) with: 10)  BMP prior to visit, ICD-9: 250.02 11)  Lipid Panel prior to visit, ICD-9: 12)  HbgA1C prior to visit, ICD-9: 13)  Please keep your followup appt with Your orthopedic doctor as well  14)  Continue all previous medications as before this visit    Immunizations Administered:  Tetanus Vaccine:    Vaccine Type: Td    Site: left deltoid    Mfr: [employer]  Dose: 0.5 ml    Route: IM    Given by: Zella Ball Ewing CMA (AAMA)    Exp. Date: 04/05/2011    Lot #: [id number]    VIS given: 01/20/07 version given September 18, 2009.  Pneumonia Vaccine:    Vaccine Type: Pneumovax    Site: right deltoid    Mfr: [employer]    Dose: 0.5 ml    Route: IM    Given by: Zella Ball Ewing CMA (AAMA)    Exp. Date: 03/03/2011    Lot #: [id number]    VIS given: 09/30/95 version given September 18, 2009.

## 2010-04-04 ENCOUNTER — Encounter: Payer: Self-pay | Admitting: Internal Medicine

## 2010-04-05 NOTE — Assessment & Plan Note (Signed)
Summary: NAUSEA R STOMACH  [facility]   Vital Signs:  Patient profile:   77 year old female Height:      66 inches Weight:      174.38 pounds BMI:     28.25 O2 Sat:      93 % on Room air Temp:     98.3 degrees F oral Pulse rate:   84 / minute BP sitting:   150 / 64  (left arm) Cuff size:   regular  Vitals Entered By: Zella Ball Ewing CMA Duncan Dull) (March 02, 2010 11:20 AM)  O2 Flow:  Room air  CC: Nausea, constipated, cough/RE   CC:  Nausea, constipated, and cough/RE.  History of Present Illness: here to f/u ; c/o acute onset 2 days fever, mild ST, general weakness and malaise with prod cough greenish sputum, but Pt denies CP, worsening sob, doe, wheezing, orthopnea, pnd, worsening LE edema, palps, dizziness or syncope  Pt denies new neuro symptoms such as headache, facial or extremity weakness  Pt denies polydipsia, polyuria, or low sugar symptoms such as shakiness improved with eating.  Overall good compliance with meds, trying to follow low chol, DM diet, wt down from 180 last visit, little excercise however  Does have mild recurring abd crampy pains, mostly to the RUQ intermittent for one wk,  not clear if worse with eating, may be some improved with BM but she is not sure;  no vomiting, or blood, dizziness.  Taking by mouth ok.  Has hx of recurring constipation for 5 months.   Has ongoing memory loss, but not significantly worse per pt, Overall good compliance with meds, and good tolerability.  Denies worsening depressive symptoms, suicidal ideation, or panic.    No worsening paranoia, agitation, hallucinations. Can do ADL;s at home.  Does not want colonoscopy at this time  No reflux symtpoms or dysphagia as long as she takes the pepcid.  Problems Prior to Update: 1)  Abdominal Pain, Right Upper Quadrant  (ICD-789.01) 2)  Bronchitis-acute  (ICD-466.0) 3)  Uti  (ICD-599.0) 4)  Fracture, Rib, Left  (ICD-807.00) 5)  Cramp in Limb  (ICD-729.82) 6)  Knee Pain, Left, Acute   (ICD-719.46) 7)  Vitamin D Deficiency  (ICD-268.9) 8)  Osteopenia  (ICD-733.90) 9)  Lbbb  (ICD-426.3) 10)  Gait Disturbance  (ICD-781.2) 11)  Fracture, Ankle, Right  (ICD-824.8) 12)  Clostridium Difficile Colitis, Hx of  (ICD-V12.79) 13)  Aaa  (ICD-441.4) 14)  Anxiety  (ICD-300.00) 15)  Orthostatic Dizziness  (ICD-780.4) 16)  Lbbb  (ICD-426.3) 17)  Dizziness  (ICD-780.4) 18)  Fatigue  (ICD-780.79) 19)  Preventive Health Care  (ICD-V70.0) 20)  Cerebrovascular Accident, Hx of  (ICD-V12.50) 21)  Menopausal Disorder  (ICD-627.9) 22)  Dementia  (ICD-294.8) 23)  Depression  (ICD-311) 24)  Allergic Rhinitis  (ICD-477.9) 25)  Gerd  (ICD-530.81) 26)  Hypertension  (ICD-401.9) 27)  Hyperlipidemia  (ICD-272.4) 28)  Diabetes Mellitus, Type II  (ICD-250.00)  Medications Prior to Update: 1)  Labetalol Hcl 100 Mg Tabs (Labetalol Hcl) .Marland Kitchen.. 1 By Mouth Every 12 Hours 2)  Citalopram Hydrobromide 20 Mg Tabs (Citalopram Hydrobromide) .Marland Kitchen.. 1 By Mouth Once Daily 3)  Famotidine 40 Mg Tabs (Famotidine) .Marland Kitchen.. 1 By Mouth At Bedtime 4)  Lantus Solostar 100 Unit/ml Soln (Insulin Glargine) .... 40 Units At Bedtime 5)  Bd Pen Needle Ultrafine 29g X 12.73mm Misc (Insulin Pen Needle) .... Use As Directed 6)  Nifedipine 30 Mg Xr24h-Tab (Nifedipine) .Marland Kitchen.. 1 By Mouth Once Daily 7)  Simvastatin  40 Mg Tabs (Simvastatin) .Marland Kitchen.. 1 By Mouth Once Daily 8)  Alendronate Sodium 70 Mg Tabs (Alendronate Sodium) .Marland Kitchen.. 1 By Mouth Q Wk 9)  Aspir-Low 81 Mg Tbec (Aspirin) .... One Daily 10)  Hydrocodone-Acetaminophen 7.5-325 Mg Tabs (Hydrocodone-Acetaminophen) .Marland Kitchen.. 1po Q 6 Hrs As Needed Pain 11)  Flexeril 5 Mg Tabs (Cyclobenzaprine Hcl) .Marland Kitchen.. 1po Three Times A Day As Needed Cramp 12)  Metformin Hcl 500 Mg Tabs (Metformin Hcl) .Marland Kitchen.. 1po Qam 13)  Aricept 10 Mg Tabs (Donepezil Hcl) .... Generic - 1 By Mouth Once Daily 14)  Risperdal 0.5 Mg Tabs (Risperidone) .... 1/2 By Mouth in The Am, and 1 By Mouth Qhs  Current Medications  (verified): 1)  Labetalol Hcl 100 Mg Tabs (Labetalol Hcl) .Marland Kitchen.. 1 By Mouth Every 12 Hours 2)  Citalopram Hydrobromide 20 Mg Tabs (Citalopram Hydrobromide) .Marland Kitchen.. 1 By Mouth Once Daily 3)  Famotidine 40 Mg Tabs (Famotidine) .Marland Kitchen.. 1 By Mouth At Bedtime 4)  Lantus Solostar 100 Unit/ml Soln (Insulin Glargine) .... 45 Units At Bedtime 5)  Bd Pen Needle Ultrafine 29g X 12.76mm Misc (Insulin Pen Needle) .... Use As Directed 6)  Nifedipine 30 Mg Xr24h-Tab (Nifedipine) .Marland Kitchen.. 1 By Mouth Once Daily 7)  Simvastatin 40 Mg Tabs (Simvastatin) .Marland Kitchen.. 1 By Mouth Once Daily 8)  Alendronate Sodium 70 Mg Tabs (Alendronate Sodium) .Marland Kitchen.. 1 By Mouth Q Wk 9)  Aspir-Low 81 Mg Tbec (Aspirin) .... One Daily 10)  Hydrocodone-Acetaminophen 7.5-325 Mg Tabs (Hydrocodone-Acetaminophen) .Marland Kitchen.. 1po Q 6 Hrs As Needed Pain 11)  Flexeril 5 Mg Tabs (Cyclobenzaprine Hcl) .Marland Kitchen.. 1po Three Times A Day As Needed Cramp 12)  Metformin Hcl 500 Mg Tabs (Metformin Hcl) .... 2 Po Qam 13)  Aricept 10 Mg Tabs (Donepezil Hcl) .... Generic - 1 By Mouth Once Daily 14)  Risperdal 0.5 Mg Tabs (Risperidone) .... 1/2 By Mouth in The Am, and 1 By Mouth Qhs 15)  Azithromycin 250 Mg Tabs (Azithromycin) .... 2po Qd For 1 Day, Then 1po Qd For 4days, Then Stop 16)  Tessalon Perles 100 Mg Caps (Benzonatate) .Marland Kitchen.. 1-2 By Mouth Three Times A Day As Needed Cough  Allergies (verified): 1)  ! Ace Inhibitors 2)  ! Ibuprofen  Past History:  Past Medical History: Last updated: 01/01/2010 1. Diabetes mellitus, type II 2. Hyperlipidemia 3. Hypertension 4. GERD 5. Allergic rhinitis 6. Depression 7. Dementia: mild.  8. History of CVA - small lacunar right 9. LBBB 10. Small AAA: last checked in 3/11, due for repeat in 3/12.  11. Carotid stenosis: Carotid dopplers in 7/11 showed 40-59% RICA and 0-39% LICA stenosis. 12. Echo (8/11): Moderate LVH, EF 55-60%, no regional wall motion abnormalities.  13. 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   Eyes:  vision grossly intact, pupils equal, and pupils round.  Ears:  bilat tm's red, sinus nontender Nose:  nasal dischargemucosal pallor and mucosal edema.   Mouth:  pharyngeal erythema and fair dentition.   Neck:  supple and no masses.   Lungs:  normal respiratory effort and normal breath sounds.   Heart:  normal rate and regular rhythm.   Abdomen:  soft and normal bowel sounds with mild ruq tender, but no guarding or rebound.   Msk:  no joint tenderness and no joint swelling.  , no flank tendeer bilat Extremities:  no edema, no erythema    Impression & Recommendations:  Problem # 1:  BRONCHITIS-ACUTE (ICD-466.0)  Her updated medication list for this problem includes:    Azithromycin 250 Mg Tabs (Azithromycin) .Marland Kitchen... 2po qd for 1 day, then 1po qd for 4days, then stop    Tessalon Perles 100 Mg Caps (Benzonatate) .Marland Kitchen... 1-2 by mouth three times a day as needed cough treat as above, f/u any worsening signs or symptoms   Problem # 2:  ABDOMINAL PAIN, RIGHT UPPER QUADRANT (ICD-789.01) ?constipation vs other- for labs below, OTC laxative  Orders: Radiology Referral (Radiology) TLB-BMP (Basic Metabolic Panel-BMET) (80048-METABOL) TLB-CBC Platelet - w/Differential (85025-CBCD) TLB-Hepatic/Liver Function Pnl  (80076-HEPATIC) TLB-Lipase (83690-LIPASE)  Problem # 3:  HYPERTENSION (ICD-401.9)  Her updated medication list for this problem includes:    Labetalol Hcl 100 Mg Tabs (Labetalol hcl) .Marland Kitchen... 1 by mouth every 12 hours    Nifedipine 30 Mg Xr24h-tab (Nifedipine) .Marland Kitchen... 1 by mouth once daily  BP today: 150/64 Prior BP: 120/62 (01/01/2010)  Labs Reviewed: K+: 4.6 (12/13/2009) Creat: : 0.8 (12/13/2009)   Chol: 144 (12/13/2009)   HDL: 34.90 (12/13/2009)   LDL: 80 (12/13/2009)   TG: 146.0 (12/13/2009) mild elev today, likely situational, ok to follow, continue same treatment    Problem # 4:  GERD (ICD-530.81)  Her updated medication list for this problem includes:    Famotidine 40 Mg Tabs (Famotidine) .Marland Kitchen... 1 by mouth at bedtime stable overall by hx and exam, ok to continue meds/tx as is   Complete Medication List: 1)  Labetalol Hcl 100 Mg Tabs (Labetalol hcl) .Marland Kitchen.. 1 by mouth every 12 hours 2)  Citalopram Hydrobromide 20 Mg Tabs (Citalopram hydrobromide) .Marland Kitchen.. 1 by mouth once daily 3)  Famotidine 40 Mg Tabs (Famotidine) .Marland Kitchen.. 1 by mouth at bedtime 4)  Lantus Solostar 100 Unit/ml Soln (Insulin glargine) .... 45 units at bedtime 5)  Bd Pen Needle Ultrafine 29g X 12.44mm Misc (Insulin pen needle) .... Use as directed 6)  Nifedipine 30 Mg Xr24h-tab (Nifedipine) .Marland Kitchen.. 1 by mouth once daily 7)  Simvastatin 40 Mg Tabs (Simvastatin) .Marland Kitchen.. 1 by mouth once daily 8)  Alendronate Sodium 70 Mg Tabs (Alendronate sodium) .Marland Kitchen.. 1 by mouth q wk 9)  Aspir-low 81 Mg Tbec (Aspirin) .... One daily 10)  Hydrocodone-acetaminophen 7.5-325 Mg Tabs (Hydrocodone-acetaminophen) .Marland Kitchen.. 1po q 6 hrs as needed pain 11)  Flexeril 5 Mg Tabs (Cyclobenzaprine hcl) .Marland Kitchen.. 1po three times a day as needed cramp 12)  Metformin Hcl 500 Mg Tabs (Metformin hcl) .... 2 po qam 13)  Aricept 10 Mg Tabs (Donepezil hcl) .... Generic - 1 by mouth once daily 14)  Risperdal 0.5 Mg Tabs (Risperidone) .... 1/2 by mouth in the am, and 1 by mouth qhs 15)   Azithromycin 250 Mg Tabs (Azithromycin) .... 2po qd for 1 day, then 1po qd for 4days, then stop 16)  Tessalon Perles 100 Mg Caps (Benzonatate) .Marland Kitchen.. 1-2 by mouth three times a day as needed cough  Patient  Instructions: 1)  Please take all new medications as prescribed 2)  Continue all previous medications as before this visit  3)  Please go to the Lab in the basement for your blood and/or urine tests today 4)  You will be contacted about the referral(s) to: abdominal ultrasound 5)  Continue all previous medications as before this visit  6)  You can also try Magnesium citrate or dulcolox OTC for constipation 7)  Please schedule a follow-up appointment in Jan 2012 as planned Prescriptions: TESSALON PERLES 100 MG CAPS (BENZONATATE) 1-2 by mouth three times a day as needed cough  #60 x 1   Entered and Authorized by:   Corwin Levins MD   Signed by:   Corwin Levins MD on 03/02/2010   Method used:   Print then Give to Patient   RxID:   [id number] AZITHROMYCIN 250 MG TABS (AZITHROMYCIN) 2po qd for 1 day, then 1po qd for 4days, then stop  #6 x 1   Entered and Authorized by:   Corwin Levins MD   Signed by:   Corwin Levins MD on 03/02/2010   Method used:   Print then Give to Patient   RxID:   [id number]    Orders Added: 1)  Radiology Referral [Radiology] 2)  TLB-BMP (Basic Metabolic Panel-BMET) [80048-METABOL] 3)  TLB-CBC Platelet - w/Differential [85025-CBCD] 4)  TLB-Hepatic/Liver Function Pnl [80076-HEPATIC] 5)  TLB-Lipase [83690-LIPASE] 6)  Est. Patient Level IV [13244]

## 2010-04-12 ENCOUNTER — Telehealth: Payer: Self-pay | Admitting: Internal Medicine

## 2010-04-19 NOTE — Progress Notes (Signed)
Summary: Cough  Phone Note Call from Patient Call back at Home Phone [phone]   Caller: Patient Summary of Call: Patient daughter lmovm requesting a call back .Marland KitchenMarland KitchenAlvy Beal Archie CMA  April 12, 2010 9:26 AM   Follow-up for Phone Call        Called the pts. daughter. She informed the patients eye are red, watery and itching. Cough has not improved and at night can be chronic. Is there anything OTC to use without interfering with other meds. or OV? Follow-up by: Zella Ball Ewing CMA Duncan Dull),  April 12, 2010 11:02 AM  Additional Follow-up for Phone Call Additional follow up Details #1::        could be viral or allergic;  if no fever , pain or increased blurry vision, she can try the OTC Zaditor (zyrtec for the eyes), or zyrtec (or allegra) OTC as needed  Additional Follow-up by: Corwin Levins MD,  April 12, 2010 11:05 AM    Additional Follow-up for Phone Call Additional follow up Details #2::    called and informed the daugter. Is there anything to get for the cough? Also the pt. is a little nauseated in the mornings, although once she eats it goes away. Follow-up by: Zella Ball Ewing CMA Duncan Dull),  April 12, 2010 11:34 AM  Additional Follow-up for Phone Call Additional follow up Details #3:: Details for Additional Follow-up Action Taken: i would try the Delsym OTC two times a day for cough Additional Follow-up by: Corwin Levins MD,  April 12, 2010 11:48 AM   Called patients daughter informed of above information.

## 2010-04-19 NOTE — Assessment & Plan Note (Signed)
Summary: 6 MO ROV /NWS  #   Vital Signs:  Patient profile:   77 year old female Height:      66 inches Weight:      180.25 pounds BMI:     29.20 O2 Sat:      95 % on Room air Temp:     98.3 degrees F oral Pulse rate:   68 / minute BP sitting:   150 / 70  (left arm) Cuff size:   regular  Vitals Entered By: Zella Ball Ewing CMA (AAMA) (March 26, 2010 9:01 AM)  O2 Flow:  Room air CC: 6 month ROV/RE   CC:  6 month ROV/RE.  History of Present Illness: here to f/u - overall doing ok,  Pt denies CP, worsening sob, doe, wheezing, orthopnea, pnd, worsening LE edema, palps, dizziness or syncope  Pt denies new neuro symptoms such as headache, facial or extremity weakness  Pt denies polydipsia, polyuria, or low sugar symptoms such as shakiness improved with eating.  Overall good compliance with meds, trying to follow low chol, DM diet, wt stable, little excercise however, but did gain 6 lbs and sugars up to 200 occasinally.  No fever, wt loss, night sweats, loss of appetite or other constitutional symptoms Overall good compliance with meds, and good tolerability.  Denies worsening depressive symptoms, suicidal ideation, or panic.  , though has ongoin anxiety but stable recently.    Problems Prior to Update: 1)  Abdominal Pain, Right Upper Quadrant  (ICD-789.01) 2)  Uti  (ICD-599.0) 3)  Fracture, Rib, Left  (ICD-807.00) 4)  Cramp in Limb  (ICD-729.82) 5)  Knee Pain, Left, Acute  (ICD-719.46) 6)  Vitamin D Deficiency  (ICD-268.9) 7)  Osteopenia  (ICD-733.90) 8)  Lbbb  (ICD-426.3) 9)  Gait Disturbance  (ICD-781.2) 10)  Fracture, Ankle, Right  (ICD-824.8) 11)  Clostridium Difficile Colitis, Hx of  (ICD-V12.79) 12)  Aaa  (ICD-441.4) 13)  Anxiety  (ICD-300.00) 14)  Orthostatic Dizziness  (ICD-780.4) 15)  Lbbb  (ICD-426.3) 16)  Dizziness  (ICD-780.4) 17)  Fatigue  (ICD-780.79) 18)  Preventive Health Care  (ICD-V70.0) 19)  Cerebrovascular Accident, Hx of  (ICD-V12.50) 20)  Menopausal Disorder   (ICD-627.9) 21)  Dementia  (ICD-294.8) 22)  Depression  (ICD-311) 23)  Allergic Rhinitis  (ICD-477.9) 24)  Gerd  (ICD-530.81) 25)  Hypertension  (ICD-401.9) 26)  Hyperlipidemia  (ICD-272.4) 27)  Diabetes Mellitus, Type II  (ICD-250.00)  Medications Prior to Update: 1)  Labetalol Hcl 100 Mg Tabs (Labetalol Hcl) .Marland Kitchen.. 1 By Mouth Every 12 Hours 2)  Citalopram Hydrobromide 20 Mg Tabs (Citalopram Hydrobromide) .Marland Kitchen.. 1 By Mouth Once Daily 3)  Famotidine 40 Mg Tabs (Famotidine) .Marland Kitchen.. 1 By Mouth At Bedtime 4)  Lantus Solostar 100 Unit/ml Soln (Insulin Glargine) .... 45 Units At Bedtime 5)  Bd Pen Needle Ultrafine 29g X 12.90mm Misc (Insulin Pen Needle) .... Use As Directed 6)  Nifedipine 30 Mg Xr24h-Tab (Nifedipine) .Marland Kitchen.. 1 By Mouth Once Daily 7)  Simvastatin 40 Mg Tabs (Simvastatin) .Marland Kitchen.. 1 By Mouth Once Daily 8)  Alendronate Sodium 70 Mg Tabs (Alendronate Sodium) .Marland Kitchen.. 1 By Mouth Q Wk 9)  Aspir-Low 81 Mg Tbec (Aspirin) .... One Daily 10)  Hydrocodone-Acetaminophen 7.5-325 Mg Tabs (Hydrocodone-Acetaminophen) .Marland Kitchen.. 1po Q 6 Hrs As Needed Pain 11)  Flexeril 5 Mg Tabs (Cyclobenzaprine Hcl) .Marland Kitchen.. 1po Three Times A Day As Needed Cramp 12)  Metformin Hcl 500 Mg Tabs (Metformin Hcl) .... 2 Po Qam 13)  Aricept 10 Mg Tabs (Donepezil Hcl) .Marland KitchenMarland KitchenMarland Kitchen  Generic - 1 By Mouth Once Daily 14)  Risperdal 0.5 Mg Tabs (Risperidone) .... 1/2 By Mouth in The Am, and 1 By Mouth Qhs 15)  Azithromycin 250 Mg Tabs (Azithromycin) .... 2po Qd For 1 Day, Then 1po Qd For 4days, Then Stop 16)  Tessalon Perles 100 Mg Caps (Benzonatate) .Marland Kitchen.. 1-2 By Mouth Three Times A Day As Needed Cough  Current Medications (verified): 1)  Labetalol Hcl 100 Mg Tabs (Labetalol Hcl) .Marland Kitchen.. 1 By Mouth Every 12 Hours 2)  Citalopram Hydrobromide 20 Mg Tabs (Citalopram Hydrobromide) .Marland Kitchen.. 1 By Mouth Once Daily 3)  Famotidine 40 Mg Tabs (Famotidine) .Marland Kitchen.. 1 By Mouth At Bedtime 4)  Lantus Solostar 100 Unit/ml Soln (Insulin Glargine) .... 45 Units At Bedtime 5)  Bd Pen  Needle Ultrafine 29g X 12.63mm Misc (Insulin Pen Needle) .... Use As Directed 6)  Nifedipine 30 Mg Xr24h-Tab (Nifedipine) .Marland Kitchen.. 1 By Mouth Once Daily 7)  Simvastatin 40 Mg Tabs (Simvastatin) .Marland Kitchen.. 1 By Mouth Once Daily 8)  Alendronate Sodium 70 Mg Tabs (Alendronate Sodium) .Marland Kitchen.. 1 By Mouth Q Wk 9)  Aspir-Low 81 Mg Tbec (Aspirin) .... One Daily 10)  Hydrocodone-Acetaminophen 7.5-325 Mg Tabs (Hydrocodone-Acetaminophen) .Marland Kitchen.. 1po Q 6 Hrs As Needed Pain 11)  Flexeril 5 Mg Tabs (Cyclobenzaprine Hcl) .Marland Kitchen.. 1po Three Times A Day As Needed Cramp 12)  Metformin Hcl 500 Mg Tabs (Metformin Hcl) .... 2 Po Qam 13)  Aricept 10 Mg Tabs (Donepezil Hcl) .... Generic - 1 By Mouth Once Daily 14)  Risperdal 0.5 Mg Tabs (Risperidone) .... 1/2 By Mouth in The Am, and 1 By Mouth Qhs  Allergies (verified): 1)  ! Ace Inhibitors 2)  ! 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 Diabetes mellitus, type II 2. Hyperlipidemia 3. Hypertension 4. GERD 5. Allergic rhinitis 6. Depression 7. Dementia: mild.  8. History of CVA - small lacunar right 9. LBBB 10. Small AAA: last checked in 3/11, due for repeat in 3/12.  11. Carotid stenosis: Carotid dopplers in 7/11 showed 40-59% RICA and 0-39% LICA stenosis. 12. Echo (8/11): Moderate LVH, EF 55-60%, no regional wall motion abnormalities.  13. Shellfish allergy: anaphylaxis Osteopenia Vit D deficiency left rib fx with fall oct 2011  Review of Systems       all otherwise negative per pt -    Physical  Exam  General:  alert and overweight-appearing Head:  normocephalic and atraumatic.   Eyes:  vision grossly intact, pupils equal, and pupils round.   Ears:  R ear normal and L ear normal.  , canals clear Nose:  no external deformity and no nasal discharge.   Mouth:  no gingival abnormalities and pharynx pink and moist.   Neck:  supple and no masses.   Lungs:  normal respiratory effort and normal breath sounds.   Heart:  normal rate and regular rhythm.   Abdomen:  soft, non-tender, and normal bowel sounds.   Msk:  no joint tenderness and no joint swelling.   Extremities:  no edema, no erythema  Psych:  not depressed appearing and  slightly anxious.     Impression & Recommendations:  Problem # 1:  DIABETES MELLITUS, TYPE II (ICD-250.00)  Her updated medication list for this problem includes:    Lantus Solostar 100 Unit/ml Soln (Insulin glargine) .Marland KitchenMarland KitchenMarland KitchenMarland Kitchen 45 units at bedtime    Aspir-low 81 Mg Tbec (Aspirin) ..... One daily    Metformin Hcl 500 Mg Tabs (Metformin hcl) .Marland Kitchen... 2 po qam treat as above, f/u any worsening signs or symptoms   Pt to cont DM diet, excercise, wt control efforts; to check labs in 6 wks after meds adjusted today  Problem # 2:  HYPERLIPIDEMIA (ICD-272.4)  Her updated medication list for this problem includes:    Simvastatin 40 Mg Tabs (Simvastatin) .Marland Kitchen... 1 by mouth once daily  Labs Reviewed: SGOT: 6 (03/02/2010)   SGPT: 16 (03/02/2010)   HDL:34.90 (12/13/2009), 39.20 (09/18/2009)  LDL:80 (12/13/2009), 74 (09/18/2009)  Chol:144 (12/13/2009), 146 (09/18/2009)  Trig:146.0 (12/13/2009), 163.0 (09/18/2009) stable overall by hx and exam, ok to continue meds/tx as is , Pt to continue diet efforts, good med tolerance; to check labs next visit- goal LDL less than 70   Problem # 3:  HYPERTENSION (ICD-401.9)  Her updated medication list for this problem includes:    Labetalol Hcl 100 Mg Tabs (Labetalol hcl) .Marland Kitchen... 1 by mouth every 12 hours    Nifedipine 30 Mg Xr24h-tab  (Nifedipine) .Marland Kitchen... 1 by mouth once daily  BP today: 150/70 Prior BP: 150/64 (03/02/2010)  Labs Reviewed: K+: 4.7 (03/02/2010) Creat: : 0.6 (03/02/2010)   Chol: 144 (12/13/2009)   HDL: 34.90 (12/13/2009)   LDL: 80 (12/13/2009)   TG: 146.0 (12/13/2009) persistent midl elev but declines change in meds today due to other change as above  Problem # 4:  ANXIETY (ICD-300.00)  Her updated medication list for this problem includes:    Citalopram Hydrobromide 20 Mg Tabs (Citalopram hydrobromide) .Marland Kitchen... 1 by mouth once daily treat as above, f/u any worsening signs or symptoms  - stable overall by hx and exam, ok to continue meds/tx as is   Complete Medication List: 1)  Labetalol Hcl 100 Mg Tabs (Labetalol hcl) .Marland Kitchen.. 1 by mouth every 12 hours 2)  Citalopram Hydrobromide 20 Mg Tabs (Citalopram hydrobromide) .Marland Kitchen.. 1 by mouth once daily 3)  Famotidine 40 Mg Tabs (Famotidine) .Marland Kitchen.. 1 by mouth at bedtime 4)  Lantus Solostar 100 Unit/ml Soln (Insulin glargine) .... 45 units at bedtime 5)  Bd Pen Needle Ultrafine 29g X 12.17mm Misc (Insulin pen needle) .... Use as directed 6)  Nifedipine 30 Mg Xr24h-tab (Nifedipine) .Marland Kitchen.. 1 by mouth once daily 7)  Simvastatin 40 Mg Tabs (Simvastatin) .Marland Kitchen.. 1 by mouth once daily 8)  Alendronate Sodium 70 Mg Tabs (Alendronate sodium) .Marland Kitchen.. 1 by mouth q wk 9)  Aspir-low 81 Mg Tbec (Aspirin) .... One daily 10)  Hydrocodone-acetaminophen 7.5-325 Mg Tabs (Hydrocodone-acetaminophen) .Marland Kitchen.. 1po q 6 hrs as needed pain 11)  Flexeril 5 Mg Tabs (Cyclobenzaprine hcl) .Marland Kitchen.. 1po three times a day as needed cramp 12)  Metformin Hcl 500 Mg Tabs (Metformin hcl) .... 2 po qam 13)  Aricept 10 Mg Tabs (Donepezil hcl) .... Generic - 1 by mouth once daily 14)  Risperdal 0.5 Mg Tabs (Risperidone) .... 1/2 by mouth in the am, and 1 by mouth qhs  Patient Instructions: 1)  Continue all previous medications as before this visit 2)  Please return in early March 2012 for LAB only: 3)  BMP prior to visit,  ICD-9: 250.02 4)  Lipid Panel prior to visit, ICD-9:  5)  HbgA1C prior to visit, ICD-9: 6)  Please schedule a follow-up appointment in 6 months. for CPX with labs and : 7)  HbgA1C prior to visit, ICD-9: 250.02 8)  Urine Microalbumin prior to visit, ICD-9:   Orders Added: 1)  Est. Patient Level IV [16109]

## 2010-04-26 ENCOUNTER — Telehealth: Payer: Self-pay | Admitting: Internal Medicine

## 2010-05-01 NOTE — Progress Notes (Signed)
  Phone Note Refill Request Message from:  Fax from Pharmacy on April 26, 2010 8:58 AM  Refills Requested: Medication #1:  ALENDRONATE SODIUM 70 MG TABS 1 by mouth q wk   Dosage confirmed as above?Dosage Confirmed   Last Refilled: 01/01/2010   [facility] Initial call taken by: Robin Ewing CMA (AAMA),  April 26, 2010 8:58 AM    Prescriptions: ALENDRONATE SODIUM 70 MG TABS (ALENDRONATE SODIUM) 1 by mouth q wk  #12 x 3   Entered by:   Scharlene Gloss CMA (AAMA)   Authorized by:   Corwin Levins MD   Signed by:   Scharlene Gloss CMA (AAMA) on 04/26/2010   Method used:   Faxed to ...       [facility] (mail-order)             , [place]         Ph: [phone]       Fax: [phone]   RxID:   [id number]

## 2010-05-01 NOTE — Progress Notes (Signed)
Summary: Request for refill  Phone Note Call from Patient   Caller: Daughter Call For: Deniece Ree Summary of Call: Pt's daughter called to request that a refill be sent to [facility] [phone] for Ms Scalzo's insulin needles..She stated that she needs Ultra Short/sls,cma Initial call taken by: Burnard Leigh [facility]),  April 26, 2010 9:46 AM    Prescriptions: BD PEN NEEDLE ULTRAFINE 29G X 12.7MM MISC (INSULIN PEN NEEDLE) Use as directed  #100 x 1   Entered by:   Margaret Pyle, CMA   Authorized by:   Corwin Levins MD   Signed by:   Margaret Pyle, CMA on 04/26/2010   Method used:   Electronically to        [facility] [address] [phone]* (retail)       [address]       [address]       [place], [place]  [address]       Ph: [phone]       Fax: [phone]   RxID:   [id number]

## 2010-05-01 NOTE — Progress Notes (Signed)
Summary: med refill  Phone Note Refill Request Message from:  Fax from Pharmacy on April 26, 2010 8:59 AM  Refills Requested: Medication #1:  HYDROCODONE-ACETAMINOPHEN 7.5-325 MG TABS 1po q 6 hrs as needed pain   Dosage confirmed as above?Dosage Confirmed   Last Refilled: 12/22/2009   Notes: [facility] fax#[phone] Initial call taken by: Robin Ewing CMA (AAMA),  April 26, 2010 9:00 AM    New/Updated Medications: HYDROCODONE-ACETAMINOPHEN 7.5-325 MG TABS (HYDROCODONE-ACETAMINOPHEN) 1po q 6 hrs as needed pain Prescriptions: HYDROCODONE-ACETAMINOPHEN 7.5-325 MG TABS (HYDROCODONE-ACETAMINOPHEN) 1po q 6 hrs as needed pain  #60 x 1   Entered and Authorized by:   Corwin Levins MD   Signed by:   Corwin Levins MD on 04/26/2010   Method used:   Print then Give to Patient   RxID:   [id number]  done hardcopy to LIM side B - dahlia Corwin Levins MD  April 26, 2010 12:38 PM   Rx faxed to pharmacy Margaret Pyle, CMA  April 26, 2010 1:14 PM

## 2010-05-03 ENCOUNTER — Other Ambulatory Visit: Payer: Self-pay

## 2010-05-03 ENCOUNTER — Telehealth: Payer: Self-pay | Admitting: Internal Medicine

## 2010-05-03 ENCOUNTER — Encounter (INDEPENDENT_AMBULATORY_CARE_PROVIDER_SITE_OTHER): Payer: Self-pay | Admitting: *Deleted

## 2010-05-03 ENCOUNTER — Other Ambulatory Visit: Payer: Self-pay | Admitting: Internal Medicine

## 2010-05-03 DIAGNOSIS — E785 Hyperlipidemia, unspecified: Secondary | ICD-10-CM

## 2010-05-03 LAB — LIPID PANEL
Cholesterol: 119 mg/dL (ref 0–200)
LDL Cholesterol: 62 mg/dL (ref 0–99)
VLDL: 18.2 mg/dL (ref 0.0–40.0)

## 2010-05-03 LAB — BASIC METABOLIC PANEL
BUN: 10 mg/dL (ref 6–23)
Creatinine, Ser: 0.6 mg/dL (ref 0.4–1.2)
GFR: 100.85 mL/min (ref >60.00)
Glucose, Bld: 135 mg/dL — ABNORMAL HIGH (ref 70–99)

## 2010-05-03 LAB — HEMOGLOBIN A1C: Hgb A1c MFr Bld: 7.8 % — ABNORMAL HIGH (ref 4.6–6.5)

## 2010-05-10 NOTE — Progress Notes (Signed)
Summary: RFs  Phone Note Call from Patient Call back at Home Phone [phone] P PH     Caller: [name] - Daughter Summary of Call: Daughter called - Req all rx's to go to [facility] NOT mail order pharm. Please call daughter when complete.  Initial call taken by: Lamar Sprinkles, CMA,  May 03, 2010 2:22 PM  Follow-up for Phone Call        Spoke with pt's daughter, [facility] removed from pt profile per her req Follow-up by: Margaret Pyle, CMA,  May 03, 2010 3:57 PM    Prescriptions: ALENDRONATE SODIUM 70 MG TABS (ALENDRONATE SODIUM) 1 by mouth q wk  #12 x 3   Entered by:   Margaret Pyle, CMA   Authorized by:   Corwin Levins MD   Signed by:   Margaret Pyle, CMA on 05/03/2010   Method used:   Electronically to        [facility] [address] [phone]* (retail)       [address]       [address]       [place], [place]  [address]       Ph: [phone]       Fax: [phone]   RxID:   [id number]

## 2010-05-16 LAB — POCT I-STAT, CHEM 8
BUN: 6 mg/dL (ref 6–23)
Chloride: 104 mEq/L (ref 96–112)
Creatinine, Ser: 0.6 mg/dL (ref 0.4–1.2)
Sodium: 138 mEq/L (ref 135–145)

## 2010-05-16 LAB — URINALYSIS, ROUTINE W REFLEX MICROSCOPIC
Glucose, UA: NEGATIVE mg/dL
Hgb urine dipstick: NEGATIVE
Protein, ur: NEGATIVE mg/dL
Specific Gravity, Urine: 1.018 (ref 1.005–1.030)
pH: 5.5 (ref 5.0–8.0)

## 2010-05-16 LAB — CBC
HCT: 37.5 % (ref 36.0–46.0)
MCV: 89.5 fL (ref 78.0–100.0)
Platelets: 269 10*3/uL (ref 150–400)
RBC: 4.19 MIL/uL (ref 3.87–5.11)
RDW: 13.5 % (ref 11.5–15.5)
WBC: 8.9 10*3/uL (ref 4.0–10.5)

## 2010-05-16 LAB — URINE CULTURE

## 2010-05-16 LAB — URINE MICROSCOPIC-ADD ON

## 2010-05-16 LAB — DIFFERENTIAL
Basophils Absolute: 0 10*3/uL (ref 0.0–0.1)
Eosinophils Relative: 4 % (ref 0–5)
Lymphocytes Relative: 18 % (ref 12–46)
Lymphs Abs: 1.6 10*3/uL (ref 0.7–4.0)
Neutro Abs: 6 10*3/uL (ref 1.7–7.7)

## 2010-05-18 ENCOUNTER — Encounter: Payer: Self-pay | Admitting: Internal Medicine

## 2010-05-21 ENCOUNTER — Ambulatory Visit (INDEPENDENT_AMBULATORY_CARE_PROVIDER_SITE_OTHER): Payer: Medicare PPO | Admitting: Internal Medicine

## 2010-05-21 ENCOUNTER — Encounter: Payer: Self-pay | Admitting: Internal Medicine

## 2010-05-21 DIAGNOSIS — F068 Other specified mental disorders due to known physiological condition: Secondary | ICD-10-CM

## 2010-05-21 DIAGNOSIS — E119 Type 2 diabetes mellitus without complications: Secondary | ICD-10-CM

## 2010-05-21 DIAGNOSIS — Z01818 Encounter for other preprocedural examination: Secondary | ICD-10-CM

## 2010-05-21 DIAGNOSIS — I10 Essential (primary) hypertension: Secondary | ICD-10-CM

## 2010-05-22 NOTE — Miscellaneous (Signed)
Summary: Orders Update  Clinical Lists Changes  Problems: Added new problem of PREOPERATIVE EXAMINATION (ICD-V72.84) Orders: Added new Referral order of Cardiolite (Cardiolite) - Signed 

## 2010-05-31 NOTE — Assessment & Plan Note (Signed)
Summary: SURGERY CLEARANCE--KNEE REPLACEMENT TO BE DONE---STC   Vital Signs:  Patient profile:   77 year old female Height:      66 inches Weight:      180 pounds BMI:     29.16 O2 Sat:      93 % on Room air Temp:     97.8 degrees F oral Pulse rate:   79 / minute BP sitting:   172 / 82  (left arm) Cuff size:   regular  Vitals Entered By: Zella Ball Ewing CMA (AAMA) (May 21, 2010 3:24 PM)  O2 Flow:  Room air CC: Medical clearance for surgury/RE   CC:  Medical clearance for surgury/RE.  History of Present Illness: here for preop clearance with family;  has worsenign left knee pain in gthe past month,  saw [facility] ortho , cortisone no help,  now for surgury soon;  Pt denies CP, worsening sob, doe, wheezing, orthopnea, pnd, worsening LE edema, palps, dizziness or syncope  Pt denies new neuro symptoms such as headache, facial or extremity weakness .  Pt denies polydipsia, polyuria, or low sugar symptoms such as shakiness improved with eating.  Overall good compliance with meds, trying to follow low chol, DM diet, wt stable, little excercise however  CBG;s in lower 100's.  Denies worsening depressive symptoms, suicidal ideation, or panic.   No fever, wt loss, night sweats, loss of appetite or other constitutional symptoms  Overall good compliance with meds, and good tolerability.  Dementia stable without behavioral changes recent such as agitation, paranoia, or hallucinations.    Problems Prior to Update: 1)  Preoperative Examination  (ICD-V72.84) 2)  Abdominal Pain, Right Upper Quadrant  (ICD-789.01) 3)  Uti  (ICD-599.0) 4)  Fracture, Rib, Left  (ICD-807.00) 5)  Cramp in Limb  (ICD-729.82) 6)  Knee Pain, Left, Acute  (ICD-719.46) 7)  Vitamin D Deficiency  (ICD-268.9) 8)  Osteopenia  (ICD-733.90) 9)  Lbbb  (ICD-426.3) 10)  Gait Disturbance  (ICD-781.2) 11)  Fracture, Ankle, Right  (ICD-824.8) 12)  Clostridium Difficile Colitis, Hx of  (ICD-V12.79) 13)  Aaa  (ICD-441.4) 14)   Anxiety  (ICD-300.00) 15)  Orthostatic Dizziness  (ICD-780.4) 16)  Lbbb  (ICD-426.3) 17)  Dizziness  (ICD-780.4) 18)  Fatigue  (ICD-780.79) 19)  Preventive Health Care  (ICD-V70.0) 20)  Cerebrovascular Accident, Hx of  (ICD-V12.50) 21)  Menopausal Disorder  (ICD-627.9) 22)  Dementia  (ICD-294.8) 23)  Depression  (ICD-311) 24)  Allergic Rhinitis  (ICD-477.9) 25)  Gerd  (ICD-530.81) 26)  Hypertension  (ICD-401.9) 27)  Hyperlipidemia  (ICD-272.4) 28)  Diabetes Mellitus, Type II  (ICD-250.00)  Medications Prior to Update: 1)  Labetalol Hcl 100 Mg Tabs (Labetalol Hcl) .Marland Kitchen.. 1 By Mouth Every 12 Hours 2)  Citalopram Hydrobromide 20 Mg Tabs (Citalopram Hydrobromide) .Marland Kitchen.. 1 By Mouth Once Daily 3)  Famotidine 40 Mg Tabs (Famotidine) .Marland Kitchen.. 1 By Mouth At Bedtime 4)  Lantus Solostar 100 Unit/ml Soln (Insulin Glargine) .... 45 Units At Bedtime 5)  Bd Pen Needle Ultrafine 29g X 12.15mm Misc (Insulin Pen Needle) .... Use As Directed 6)  Nifedipine 30 Mg Xr24h-Tab (Nifedipine) .Marland Kitchen.. 1 By Mouth Once Daily 7)  Simvastatin 40 Mg Tabs (Simvastatin) .Marland Kitchen.. 1 By Mouth Once Daily 8)  Alendronate Sodium 70 Mg Tabs (Alendronate Sodium) .Marland Kitchen.. 1 By Mouth Q Wk 9)  Aspir-Low 81 Mg Tbec (Aspirin) .... One Daily 10)  Hydrocodone-Acetaminophen 7.5-325 Mg Tabs (Hydrocodone-Acetaminophen) .Marland Kitchen.. 1po Q 6 Hrs As Needed Pain 11)  Flexeril 5 Mg Tabs (Cyclobenzaprine  Hcl) .... 1po Three Times A Day As Needed Cramp 12)  Metformin Hcl 500 Mg Xr24h-Tab (Metformin Hcl) .... 3 By Mouth Qam 13)  Aricept 10 Mg Tabs (Donepezil Hcl) .... Generic - 1 By Mouth Once Daily 14)  Risperdal 0.5 Mg Tabs (Risperidone) .... 1/2 By Mouth in The Am, and 1 By Mouth Qhs  Current Medications (verified): 1)  Labetalol Hcl 200 Mg Tabs (Labetalol Hcl) .Marland Kitchen.. 1 By Mouth Two Times A Day 2)  Citalopram Hydrobromide 20 Mg Tabs (Citalopram Hydrobromide) .Marland Kitchen.. 1 By Mouth Once Daily 3)  Famotidine 40 Mg Tabs (Famotidine) .Marland Kitchen.. 1 By Mouth At Bedtime 4)  Lantus  Solostar 100 Unit/ml Soln (Insulin Glargine) .... 45 Units At Bedtime 5)  Bd Pen Needle Ultrafine 29g X 12.4mm Misc (Insulin Pen Needle) .... Use As Directed 6)  Nifedipine 30 Mg Xr24h-Tab (Nifedipine) .Marland Kitchen.. 1 By Mouth Once Daily 7)  Simvastatin 40 Mg Tabs (Simvastatin) .Marland Kitchen.. 1 By Mouth Once Daily 8)  Alendronate Sodium 70 Mg Tabs (Alendronate Sodium) .Marland Kitchen.. 1 By Mouth Q Wk 9)  Aspir-Low 81 Mg Tbec (Aspirin) .... One Daily 10)  Hydrocodone-Acetaminophen 7.5-325 Mg Tabs (Hydrocodone-Acetaminophen) .Marland Kitchen.. 1po Q 6 Hrs As Needed Pain 11)  Flexeril 5 Mg Tabs (Cyclobenzaprine Hcl) .Marland Kitchen.. 1po Three Times A Day As Needed Cramp 12)  Metformin Hcl 500 Mg Xr24h-Tab (Metformin Hcl) .... 3 By Mouth Qam 13)  Aricept 10 Mg Tabs (Donepezil Hcl) .... Generic - 1 By Mouth Once Daily 14)  Risperdal 0.5 Mg Tabs (Risperidone) .... 1/2 By Mouth in The Am, and 1 By Mouth Qhs  Allergies (verified): 1)  ! Ace Inhibitors 2)  ! Ibuprofen  Past History:  Past Medical History: Last updated: 03/26/2010 1. Diabetes mellitus, type II 2. Hyperlipidemia 3. Hypertension 4. GERD 5. Allergic rhinitis 6. Depression 7. Dementia: mild.  8. History of CVA - small lacunar right 9. LBBB 10. Small AAA: last checked in 3/11, due for repeat in 3/12.  11. Carotid stenosis: Carotid dopplers in 7/11 showed 40-59% RICA and 0-39% LICA stenosis. 12. Echo (8/11): Moderate LVH, EF 55-60%, no regional wall motion abnormalities.  13. 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   Eyes:  vision grossly intact, pupils equal, and pupils round.   Ears:  R ear normal and L ear normal.  , canals clear Nose:  no external deformity and no nasal discharge.   Mouth:  no gingival abnormalities and pharynx pink and moist.   Neck:  supple and no masses.   Lungs:  normal respiratory effort and normal breath sounds.   Heart:  normal rate and regular rhythm.   Abdomen:  soft, non-tender, and normal bowel sounds.   Msk:  no redness over joints and no joint deformities.   Extremities:  no edema, no erythema  Neurologic:  cranial nerves II-XII intact  and strength normal in all extremities.   Skin:  color normal and no rashes.   Psych:  not anxious appearing and not depressed appearing.     Impression & Recommendations:  Problem # 1:  HYPERTENSION (ICD-401.9)  Her updated medication list for this problem includes:    Labetalol Hcl 200 Mg Tabs (Labetalol hcl) .Marland Kitchen... 1 by mouth two times a day    Nifedipine 30 Mg Xr24h-tab (Nifedipine) .Marland Kitchen... 1 by mouth once daily  BP today: 172/82 Prior BP: 150/70 (03/26/2010)  Labs Reviewed: K+: 4.6 (05/03/2010) Creat: : 0.6 (05/03/2010)   Chol: 119 (05/03/2010)   HDL: 39.10 (05/03/2010)   LDL: 62 (05/03/2010)   TG: 91.0 (05/03/2010) stable overall by hx and exam, ok to continue meds/tx as is   Problem # 2:  DIABETES MELLITUS, TYPE II (ICD-250.00)  Her updated medication list for this problem includes:    Lantus Solostar 100 Unit/ml Soln (Insulin glargine) .Marland KitchenMarland KitchenMarland KitchenMarland Kitchen 45 units at bedtime    Aspir-low 81 Mg Tbec (Aspirin) ..... One daily    Metformin Hcl 500 Mg Xr24h-tab (Metformin hcl) .Marland KitchenMarland KitchenMarland KitchenMarland Kitchen 3 by mouth qam  Labs Reviewed: Creat: 0.6 (05/03/2010)    Reviewed HgBA1c results: 7.8 (05/03/2010)  8.7 (03/02/2010) stable overall by hx and  exam, ok to continue meds/tx as is   Problem # 3:  DEMENTIA (ICD-294.8) stable overall by hx and exam, ok to continue meds/tx as is   Problem # 4:  PREOPERATIVE EXAMINATION (ICD-V72.84) will need stress test, which has been scheduled;  if neg, she would be cleared for surgury  Complete Medication List: 1)  Labetalol Hcl 200 Mg Tabs (Labetalol hcl) .Marland Kitchen.. 1 by mouth two times a day 2)  Citalopram Hydrobromide 20 Mg Tabs (Citalopram hydrobromide) .Marland Kitchen.. 1 by mouth once daily 3)  Famotidine 40 Mg Tabs (Famotidine) .Marland Kitchen.. 1 by mouth at bedtime 4)  Lantus Solostar 100 Unit/ml Soln (Insulin glargine) .... 45 units at bedtime 5)  Bd Pen Needle Ultrafine 29g X 12.35mm Misc (Insulin pen needle) .... Use as directed 6)  Nifedipine 30 Mg Xr24h-tab (Nifedipine) .Marland Kitchen.. 1 by mouth once daily 7)  Simvastatin 40 Mg Tabs (Simvastatin) .Marland Kitchen.. 1 by mouth once daily 8)  Alendronate Sodium 70 Mg Tabs (Alendronate sodium) .Marland Kitchen.. 1 by mouth q wk 9)  Aspir-low 81 Mg Tbec (Aspirin) .... One daily 10)  Hydrocodone-acetaminophen 7.5-325 Mg Tabs (Hydrocodone-acetaminophen) .Marland Kitchen.. 1po q 6 hrs as needed pain 11)  Flexeril 5 Mg Tabs (Cyclobenzaprine hcl) .Marland Kitchen.. 1po three times a day as needed cramp 12)  Metformin Hcl 500 Mg Xr24h-tab (Metformin hcl) .... 3 by mouth qam 13)  Aricept 10 Mg Tabs (Donepezil hcl) .... Generic - 1 by mouth once daily 14)  Risperdal 0.5 Mg Tabs (Risperidone) .... 1/2 by mouth in the am, and 1 by mouth qhs  Patient Instructions: 1)  Increase the labetolol to 200 mg two times a day  2)  please keep your appt for the stress test on Apr 5 as planned 3)  On that day, you will be asked to not take the labetolol in the morning--  so on that day only, you should take 2 of the nifedipine to keep the BP down so that the stress test can be done 4)  Continue all previous medications as before this visit  5)  Please schedule a follow-up appointment in 6 months. Prescriptions: LABETALOL HCL 200 MG TABS (LABETALOL HCL) 1  by mouth two times a day  #60 x 11   Entered and Authorized  by:   Corwin Levins MD   Signed by:   Corwin Levins MD on 05/21/2010   Method used:   Electronically to        [facility] [address] [phone]* (retail)       [address]       [address]       [place], [place]  [address]       Ph: [phone]       Fax: [phone]   RxID:   [id number]    Orders Added: 1)  Est. Patient Level IV [44010]

## 2010-06-04 ENCOUNTER — Telehealth: Payer: Self-pay

## 2010-06-04 NOTE — Telephone Encounter (Signed)
Noted, ok to proceed with stress test

## 2010-06-04 NOTE — Telephone Encounter (Signed)
Pt's daughter called to inform MD that pt's BP today was 143/61. Pt is scheduled to have stress test 04/05

## 2010-06-05 NOTE — Telephone Encounter (Signed)
Pt's daughter advised of same

## 2010-06-07 ENCOUNTER — Ambulatory Visit (HOSPITAL_COMMUNITY): Payer: Medicare PPO | Attending: Internal Medicine | Admitting: Radiology

## 2010-06-07 VITALS — Ht 66.0 in | Wt 174.0 lb

## 2010-06-07 DIAGNOSIS — Z01810 Encounter for preprocedural cardiovascular examination: Secondary | ICD-10-CM | POA: Insufficient documentation

## 2010-06-07 DIAGNOSIS — R0609 Other forms of dyspnea: Secondary | ICD-10-CM

## 2010-06-07 DIAGNOSIS — I447 Left bundle-branch block, unspecified: Secondary | ICD-10-CM

## 2010-06-07 MED ORDER — TECHNETIUM TC 99M TETROFOSMIN IV KIT
10.4000 | PACK | Freq: Once | INTRAVENOUS | Status: AC | PRN
  Administered 2010-06-07: 10 via INTRAVENOUS

## 2010-06-07 MED ORDER — TECHNETIUM TC 99M TETROFOSMIN IV KIT
33.0000 | PACK | Freq: Once | INTRAVENOUS | Status: AC | PRN
  Administered 2010-06-07: 33 via INTRAVENOUS

## 2010-06-07 MED ORDER — ADENOSINE (DIAGNOSTIC) 3 MG/ML IV SOLN
0.5600 mg/kg | Freq: Once | INTRAVENOUS | Status: AC
  Administered 2010-06-07: 44.1 mg via INTRAVENOUS

## 2010-06-07 NOTE — Progress Notes (Signed)
[facility] SITE 3 NUCLEAR MED [address] [place] [address] [phone]  Cardiology Nuclear Med Study  Alicia Kent is a 77 y.o. female [phone] [birth year]   Nuclear Med Background Indication for Stress Test:  Evaluation for Ischemia, Surgical Clearance for (L) TKR by Dr. Kennedy Bucker History:  '11 Echo:EF=55-60%, moderate LVH, '11 HKV:QQVZD infero-septal infarct at the base, no ischemia, EF=64%, h/o AAA (3.5 cm) Cardiac Risk Factors: Carotid Disease, CVA, Hypertension, IDDM Type 2, LBBB, Lipids, PVD and Smoker  Symptoms:  Dizziness, DOE and Fatigue   Nuclear Pre-Procedure Caffeine/Decaff Intake:  None NPO After: 7:30pm   Lungs:  Clear.  O2 sat 95% on RA. IV 0.9% NS with Angio Cath:  22g  IV Site: R Antecubital  IV Started by:  Irean Hong, RN  Chest Size (in):  40 Cup Size: D  Height: 5\' 6"  (1.676 m)  Weight:  174 lb (78.926 kg)  BMI:  Body mass index is 28.08 kg/(m^2). Tech Comments:  Held labetalol this am    Nuclear Med Study 1 or 2 day study: 1 day  Stress Test Type:  Adenosine  Reading MD: Charlton Haws, MD  Order Authorizing Provider:  Oliver Barre, MD  Resting Radionuclide: Technetium 69m Tetrofosmin  Resting Radionuclide Dose: 10.4 mCi   Stress Radionuclide:  Technetium 20m Tetrofosmin  Stress Radionuclide Dose: 33 mCi           Stress Protocol Rest HR: 67 Stress HR: 83  Rest BP: 143/74 Stress BP: 153/62  Exercise Time (min): n/a METS: n/a   Predicted Max HR: 143 bpm % Max HR: 57.34 bpm Rate Pressure Product: 63875   Dose of Adenosine (mg):  44.3 Dose of Lexiscan: n/a mg  Dose of Atropine (mg): n/a Dose of Dobutamine: n/a mcg/kg/min (at max HR)  Stress Test Technologist: Rea College, CMA-N  Nuclear Technologist:  Doyne Keel, CNMT     Rest Procedure:  Myocardial perfusion imaging was performed at rest 45 minutes following the intravenous administration of Technetium 54m Tetrofosmin. Rest ECG: LBBB  Stress Procedure:  The patient  received IV adenosine at 140 mcg/kg/min for 4 minutes.  There were no significant changes with infusion.  Technetium 20m Tetrofosmin was injected at the 2 minute mark and quantitative spect images were obtained after a 45 minute delay. Stress ECG: No significant change from baseline ECG  QPS Raw Data Images:  Normal; no motion artifact; normal heart/lung ratio. Stress Images:  Normal homogeneous uptake in all areas of the myocardium. Rest Images:  Normal homogeneous uptake in all areas of the myocardium. Subtraction (SDS):  Normal Transient Ischemic Dilatation (Normal <1.22):  1.03 Lung/Heart Ratio (Normal <0.45):  0.26  Quantitative Gated Spect Images QGS EDV:  77 ml QGS ESV:  27 ml QGS cine images:  NL LV Function; NL Wall Motion QGS EF: 65%  Impression Exercise Capacity:  Adenosine study with no exercise. BP Response:  Normal blood pressure response. Clinical Symptoms:  Stomach Ache ECG Impression:  Baseline IVCD Comparison with Prior Nuclear Study: No images to compare  Overall Impression:  Low risk study No ischemia Possible basal inferoseptal infarct      Charlton Haws

## 2010-06-11 ENCOUNTER — Encounter: Payer: Self-pay | Admitting: Internal Medicine

## 2010-06-11 NOTE — Progress Notes (Signed)
ROUTED TO DR. Jonny Ruiz

## 2010-06-19 ENCOUNTER — Telehealth: Payer: Self-pay

## 2010-06-19 NOTE — Telephone Encounter (Signed)
Pt's daughter called for results of stress test pt had done 06/07/2010. Please advise

## 2010-06-19 NOTE — Telephone Encounter (Signed)
Should be on PT  Was negative for ischemia

## 2010-06-20 NOTE — Telephone Encounter (Signed)
[name] advised, results faxed to Ortho per pt req [phone] Attn Dr Kennedy Bucker

## 2010-07-12 ENCOUNTER — Ambulatory Visit: Payer: Self-pay | Admitting: Orthopedic Surgery

## 2010-07-19 ENCOUNTER — Inpatient Hospital Stay: Payer: Self-pay | Admitting: Orthopedic Surgery

## 2010-07-22 ENCOUNTER — Other Ambulatory Visit: Payer: Self-pay | Admitting: Internal Medicine

## 2010-07-23 ENCOUNTER — Other Ambulatory Visit: Payer: Self-pay | Admitting: Internal Medicine

## 2010-07-25 ENCOUNTER — Other Ambulatory Visit: Payer: Self-pay | Admitting: Internal Medicine

## 2010-09-18 ENCOUNTER — Other Ambulatory Visit: Payer: Self-pay

## 2010-09-18 ENCOUNTER — Other Ambulatory Visit: Payer: Self-pay | Admitting: Internal Medicine

## 2010-09-18 DIAGNOSIS — Z0000 Encounter for general adult medical examination without abnormal findings: Secondary | ICD-10-CM

## 2010-09-19 ENCOUNTER — Other Ambulatory Visit (INDEPENDENT_AMBULATORY_CARE_PROVIDER_SITE_OTHER): Payer: Medicare PPO | Admitting: Internal Medicine

## 2010-09-19 ENCOUNTER — Other Ambulatory Visit (INDEPENDENT_AMBULATORY_CARE_PROVIDER_SITE_OTHER): Payer: Medicare PPO

## 2010-09-19 DIAGNOSIS — Z0000 Encounter for general adult medical examination without abnormal findings: Secondary | ICD-10-CM

## 2010-09-19 DIAGNOSIS — IMO0001 Reserved for inherently not codable concepts without codable children: Secondary | ICD-10-CM

## 2010-09-19 LAB — CBC WITH DIFFERENTIAL/PLATELET
Basophils Relative: 0.5 % (ref 0.0–3.0)
Eosinophils Relative: 4.5 % (ref 0.0–5.0)
Hemoglobin: 11.8 g/dL — ABNORMAL LOW (ref 12.0–15.0)
Lymphocytes Relative: 21.5 % (ref 12.0–46.0)
Monocytes Relative: 7.9 % (ref 3.0–12.0)
Neutro Abs: 4.6 10*3/uL (ref 1.4–7.7)
RBC: 4 Mil/uL (ref 3.87–5.11)
WBC: 7.1 10*3/uL (ref 4.5–10.5)

## 2010-09-19 LAB — URINALYSIS, ROUTINE W REFLEX MICROSCOPIC
Bilirubin Urine: NEGATIVE
Nitrite: NEGATIVE
Urobilinogen, UA: 0.2 (ref 0.0–1.0)
pH: 5 (ref 5.0–8.0)

## 2010-09-19 LAB — TSH: TSH: 2.67 u[IU]/mL (ref 0.35–5.50)

## 2010-09-19 LAB — BASIC METABOLIC PANEL
CO2: 27 mEq/L (ref 19–32)
Calcium: 9 mg/dL (ref 8.4–10.5)
Chloride: 108 mEq/L (ref 96–112)
Glucose, Bld: 80 mg/dL (ref 70–99)
Sodium: 142 mEq/L (ref 135–145)

## 2010-09-19 LAB — LIPID PANEL
HDL: 39.7 mg/dL (ref >39.00)
LDL Cholesterol: 62 mg/dL (ref 0–99)
Total CHOL/HDL Ratio: 3

## 2010-09-19 LAB — HEPATIC FUNCTION PANEL
Albumin: 3.9 g/dL (ref 3.5–5.2)
Alkaline Phosphatase: 69 U/L (ref 39–117)

## 2010-09-21 ENCOUNTER — Encounter: Payer: Self-pay | Admitting: Internal Medicine

## 2010-09-21 DIAGNOSIS — Z0000 Encounter for general adult medical examination without abnormal findings: Secondary | ICD-10-CM | POA: Insufficient documentation

## 2010-09-25 ENCOUNTER — Encounter: Payer: Self-pay | Admitting: Internal Medicine

## 2010-09-25 ENCOUNTER — Ambulatory Visit (INDEPENDENT_AMBULATORY_CARE_PROVIDER_SITE_OTHER): Payer: Medicare PPO | Admitting: Internal Medicine

## 2010-09-25 VITALS — BP 132/62 | HR 67 | Temp 97.5°F | Ht 66.0 in | Wt 172.5 lb

## 2010-09-25 DIAGNOSIS — E119 Type 2 diabetes mellitus without complications: Secondary | ICD-10-CM

## 2010-09-25 DIAGNOSIS — Z0000 Encounter for general adult medical examination without abnormal findings: Secondary | ICD-10-CM

## 2010-09-25 DIAGNOSIS — R0609 Other forms of dyspnea: Secondary | ICD-10-CM

## 2010-09-25 MED ORDER — METFORMIN HCL 500 MG PO TABS: ORAL_TABLET | ORAL | Status: DC

## 2010-09-25 MED ORDER — FLUTICASONE-SALMETEROL 250-50 MCG/DOSE IN AEPB: 1.0000 | INHALATION_SPRAY | Freq: Two times a day (BID) | RESPIRATORY_TRACT | Status: DC

## 2010-09-25 NOTE — Assessment & Plan Note (Signed)

## 2010-09-25 NOTE — Assessment & Plan Note (Addendum)
Suspect underlying  Copd;  For PFT's and trial advair given cough, wheeze, consider echo as well

## 2010-09-25 NOTE — Progress Notes (Signed)
Subjective:    Patient ID: Alicia Kent, female    DOB: 02/11/1931, 77 y.o.   MRN: [id number]  HPI  Here for wellness and f/u;  Overall doing ok;  Pt denies CP,  orthopnea, PND, worsening LE edema, palpitations, dizziness or syncope., though has had some mild wheeze/ongoing cough/DOE in the past few wks per family.  Pt denies neurological change such as new Headache, facial or extremity weakness.  Pt denies polydipsia, polyuria, or low sugar symptoms. Pt states overall good compliance with treatment and medications, good tolerability, and trying to follow lower cholesterol diet.  Pt denies worsening depressive symptoms, suicidal ideation or panic. No fever, wt loss, night sweats, loss of appetite, or other constitutional symptoms.  Pt states good ability with ADL's, low fall risk, home safety reviewed and adequate, no significant changes in hearing or vision, and occasionally active with exercise.  Is s/p left knee TKR in May 2012 without complication.  C/o recurrent nausea  Without vomiting or wt loss;  Seems worse after taking the 3 metformoin in the AM.  Does have sense of ongoing fatigue, but denies signficant hypersomnolence.  Past Medical History  Diagnosis Date  . Diabetes mellitus type II   . Hyperlipidemia   . HTN (hypertension)   . GERD (gastroesophageal reflux disease)   . Allergic rhinitis   . Depression   . Dementia     mild  . History of CVA (cerebrovascular accident)   . LBBB (left bundle branch block)   . AAA (abdominal aortic aneurysm)     small, last check in 05/2009, due for repeat in 05/2010  . Carotid stenosis 09/2009    40-59% RICA, 0-39% LICA  . LVH (left ventricular hypertrophy) 10/2009    Moderate, EF 55-60%, no regional wall motion abnormalities  . Shellfish allergy     anaphylaxis  . Osteopenia   . Vitamin D deficiency   . Fracture of rib of left side 12/2009   Past Surgical History  Procedure Date  . Ankle surgery     Right, by Ortho in [place]  . Tracheostomy  2010    anaphylactic episode on vent  . Tubal ligation     reports that she has been smoking.  She does not have any smokeless tobacco history on file. She reports that she does not drink alcohol or use illicit drugs. family history includes Diabetes type II in her other; Hypertension in her brother; Rheum arthritis in her sister; and Stroke in her brother. Allergies  Allergen Reactions  . Shellfish-Derived Products Anaphylaxis    Swelling tongue and throat  . Ace Inhibitors   . Ibuprofen    Current Outpatient Prescriptions on File Prior to Visit  Medication Sig Dispense Refill  . aspirin 81 MG EC tablet Take 81 mg by mouth daily.        . citalopram (CELEXA) 20 MG tablet TAKE 1 TABLET BY MOUTH DAILY  90 tablet  3  . cyclobenzaprine (FLEXERIL) 5 MG tablet Take 5 mg by mouth 3 (three) times daily as needed. For cramp       . donepezil (ARICEPT) 10 MG tablet Take 10 mg by mouth daily.        . famotidine (PEPCID) 40 MG tablet TAKE 1 TABLET BY MOUTH EVERY NIGHT AT BEDTIME  90 tablet  3  . HYDROcodone-acetaminophen (NORCO) 7.5-325 MG per tablet Take 1 tablet by mouth every 6 (six) hours as needed. pain       . insulin glargine (LANTUS) 100  UNIT/ML injection Inject 45 Units into the skin at bedtime.        . Insulin Pen Needle (BD ULTRA-FINE PEN NEEDLES) 29G X 12.7MM MISC by Does not apply route as directed.        . labetalol (NORMODYNE) 200 MG tablet Take 200 mg by mouth 2 (two) times daily.        Marland Kitchen NIFEdipine (PROCARDIA-XL/ADALAT CC) 30 MG 24 hr tablet Take 30 mg by mouth daily.        . risperiDONE (RISPERDAL) 0.5 MG tablet Take 0.5 mg by mouth as directed. Take 1/2 tablet by mouth in the AM, and 1 tablet by mouth QHS       . simvastatin (ZOCOR) 40 MG tablet TAKE 1 TABLET BY MOUTH DAILY  90 tablet  3   Review of Systems Review of Systems  Constitutional: Negative for diaphoresis, activity change, appetite change and unexpected weight change.  HENT: Negative for hearing loss, ear pain,  facial swelling, mouth sores and neck stiffness.   Eyes: Negative for pain, redness and visual disturbance.  Respiratory: Negative for shortness of breath and wheezing.   Cardiovascular: Negative for chest pain and palpitations.  Gastrointestinal: Negative for diarrhea, blood in stool, abdominal distention and rectal pain.  Genitourinary: Negative for hematuria, flank pain and decreased urine volume.  Musculoskeletal: Negative for myalgias and joint swelling.  Skin: Negative for color change and wound.  Neurological: Negative for syncope and numbness.  Hematological: Negative for adenopathy.  Psychiatric/Behavioral: Negative for hallucinations, self-injury, decreased concentration and agitation.      Objective:   Physical Exam BP 132/62  Pulse 67  Temp(Src) 97.5 F (36.4 C) (Oral)  Ht 5\' 6"  (1.676 m)  Wt 172 lb 8 oz (78.245 kg)  BMI 27.84 kg/m2  SpO2 94% Physical Exam  VS noted Constitutional: Pt is oriented to person, place, and time. Appears well-developed and well-nourished.  HENT:  Head: Normocephalic and atraumatic.  Right Ear: External ear normal.  Left Ear: External ear normal.  Nose: Nose normal.  Mouth/Throat: Oropharynx is clear and moist.  Eyes: Conjunctivae and EOM are normal. Pupils are equal, round, and reactive to light.  Neck: Normal range of motion. Neck supple. No JVD present. No tracheal deviation present.  Cardiovascular: Normal rate, regular rhythm, normal heart sounds and intact distal pulses.   Pulmonary/Chest: Effort normal and breath sounds decreased bilat , no wheezing today Abdominal: Soft. Bowel sounds are normal. There is no tenderness.  Musculoskeletal: Normal range of motion. Exhibits no edema.  Lymphadenopathy:  Has no cervical adenopathy.  Neurological: Pt is alert and oriented to person, place, and time. Pt has normal reflexes. No cranial nerve deficit.  Skin: Skin is warm and dry. No rash noted.  Psychiatric:  Has  normal mood and affect.  Behavior is normal.        Assessment & Plan:

## 2010-09-25 NOTE — Patient Instructions (Addendum)
Please change the metformin to 1 in the AM, and 2 in thePM Please consider the shingles shot, call the [facility] medicare to see if covered;  If covered you can call for Nurse Visit Appt for the shot You are otherwise up to date with prevention for now Please try the Advair 1 puff twice per day; you should continue if it seems to help after the sample is done You will be contacted regarding the referral for: Lung testing (PFT's) Please return in 6 months, or sooner if needed, with labs done 3-5 days before Please have the pharmacy call with any further refills needed

## 2010-09-28 ENCOUNTER — Telehealth: Payer: Self-pay

## 2010-09-28 NOTE — Telephone Encounter (Signed)
Pt's daughter advised in detail of how Metamucil will help. Daughter expressed understanding and will call back as needed.

## 2010-09-28 NOTE — Telephone Encounter (Signed)
OK to try metamucil daily as this can help solidify

## 2010-09-28 NOTE — Telephone Encounter (Signed)
Pt's daughter called stating pt has been experiencing some fecal incontinence that has been getting progessively more frequent. Daughter is requesting MD advisement.

## 2010-10-08 ENCOUNTER — Other Ambulatory Visit: Payer: Self-pay | Admitting: Internal Medicine

## 2010-10-08 DIAGNOSIS — I6529 Occlusion and stenosis of unspecified carotid artery: Secondary | ICD-10-CM

## 2010-10-09 ENCOUNTER — Encounter (INDEPENDENT_AMBULATORY_CARE_PROVIDER_SITE_OTHER): Payer: Medicare PPO | Admitting: *Deleted

## 2010-10-09 DIAGNOSIS — I6529 Occlusion and stenosis of unspecified carotid artery: Secondary | ICD-10-CM

## 2010-10-11 ENCOUNTER — Encounter: Payer: Self-pay | Admitting: Internal Medicine

## 2010-10-12 ENCOUNTER — Ambulatory Visit (INDEPENDENT_AMBULATORY_CARE_PROVIDER_SITE_OTHER): Payer: Medicare PPO | Admitting: Internal Medicine

## 2010-10-12 DIAGNOSIS — R0989 Other specified symptoms and signs involving the circulatory and respiratory systems: Secondary | ICD-10-CM

## 2010-10-12 LAB — PULMONARY FUNCTION TEST

## 2010-10-12 NOTE — Progress Notes (Signed)
PFT done today. 

## 2011-01-18 ENCOUNTER — Ambulatory Visit (INDEPENDENT_AMBULATORY_CARE_PROVIDER_SITE_OTHER): Payer: Medicare PPO | Admitting: Internal Medicine

## 2011-01-22 ENCOUNTER — Encounter: Payer: Self-pay | Admitting: Internal Medicine

## 2011-01-22 ENCOUNTER — Ambulatory Visit (INDEPENDENT_AMBULATORY_CARE_PROVIDER_SITE_OTHER): Payer: Medicare PPO | Admitting: Internal Medicine

## 2011-01-22 DIAGNOSIS — J45909 Unspecified asthma, uncomplicated: Secondary | ICD-10-CM

## 2011-01-22 DIAGNOSIS — R32 Unspecified urinary incontinence: Secondary | ICD-10-CM | POA: Insufficient documentation

## 2011-01-22 DIAGNOSIS — J309 Allergic rhinitis, unspecified: Secondary | ICD-10-CM

## 2011-01-22 DIAGNOSIS — F068 Other specified mental disorders due to known physiological condition: Secondary | ICD-10-CM

## 2011-01-22 MED ORDER — FEXOFENADINE HCL 180 MG PO TABS: 180.0000 mg | ORAL_TABLET | Freq: Every day | ORAL | Status: DC

## 2011-01-22 MED ORDER — RISPERIDONE 0.5 MG PO TABS: ORAL_TABLET | ORAL | Status: DC

## 2011-01-22 MED ORDER — MONTELUKAST SODIUM 10 MG PO TABS: 10.0000 mg | ORAL_TABLET | Freq: Every day | ORAL | Status: DC

## 2011-01-22 MED ORDER — MEMANTINE HCL 5 MG PO TABS: 5.0000 mg | ORAL_TABLET | Freq: Two times a day (BID) | ORAL | Status: DC

## 2011-01-22 NOTE — Patient Instructions (Signed)
Take all new medications as prescribed - the increase risperdal, and the namenda, allegra, and singulair OK to stop the advair as you have Continue all other medications as before Please go to LAB in the Basement for the urine tests to be done today Please call the phone number [phone] (the [employer] System) for results of testing in 2-3 days;  When calling, simply dial the number, and when prompted enter the MRN number above (the Medical Record Number) and the # key, then the message should start.

## 2011-01-27 ENCOUNTER — Encounter: Payer: Self-pay | Admitting: Internal Medicine

## 2011-01-27 DIAGNOSIS — J45909 Unspecified asthma, uncomplicated: Secondary | ICD-10-CM | POA: Insufficient documentation

## 2011-01-27 NOTE — Assessment & Plan Note (Signed)
Ok for allegra otc prn,  to f/u any worsening symptoms or concerns 

## 2011-01-27 NOTE — Assessment & Plan Note (Addendum)
Worsening it seems, for addition of namenda and increased daytime risperdal,  to f/u any worsening symptoms or concerns

## 2011-01-27 NOTE — Assessment & Plan Note (Signed)
To add singulair asd,,  to f/u any worsening symptoms or concerns

## 2011-01-27 NOTE — Progress Notes (Signed)
Subjective:    Patient ID: Alicia Kent, female    DOB: February 22, 1931, 77 y.o.   MRN: [id number]  HPI  Pt here with daughter, who reports tearfully of mother's worsening emotional stability with increased daytime agitation, worsening memory problems, recurring now bowel and bladder incontinence, lives with daughter who states no family/sibs help, but they also encourage her to keep taking care of mother at home for now.  Has had an episode of cough with eating last wk solid food, but no fever, or further cough, sob and eating well since then.  Pt denies chest pain, increased sob or doe, wheezing, orthopnea, PND, increased LE swelling, palpitations, dizziness or syncope.  Pt denies new neurological symptoms such as new headache, or facial or extremity weakness or numbness   Pt denies polydipsia, polyuria.  Does have several wks ongoing nasal allergy symptoms with clear congestion, itch and sneeze, without fever, pain, ST, cough or wheezing.  Cannot use the advair inhaler now and daughter asks for replacement medicaiton if possible Past Medical History  Diagnosis Date  . Diabetes mellitus type II   . Hyperlipidemia   . HTN (hypertension)   . GERD (gastroesophageal reflux disease)   . Allergic rhinitis   . Depression   . Dementia     mild  . History of CVA (cerebrovascular accident)   . LBBB (left bundle branch block)   . AAA (abdominal aortic aneurysm)     small, last check in 05/2009, due for repeat in 05/2010  . Carotid stenosis 09/2009    40-59% RICA, 0-39% LICA  . LVH (left ventricular hypertrophy) 10/2009    Moderate, EF 55-60%, no regional wall motion abnormalities  . Shellfish allergy     anaphylaxis  . Osteopenia   . Vitamin D deficiency   . Fracture of rib of left side 12/2009   Past Surgical History  Procedure Date  . Ankle surgery     Right, by Ortho in [place]  . Tracheostomy 2010    anaphylactic episode on vent  . Tubal ligation     reports that she has been smoking.  She does  not have any smokeless tobacco history on file. She reports that she does not drink alcohol or use illicit drugs. family history includes Diabetes type II in her other; Hypertension in her brother; Rheum arthritis in her sister; and Stroke in her brother. Allergies  Allergen Reactions  . Shellfish-Derived Products Anaphylaxis    Swelling tongue and throat  . Ace Inhibitors   . Ibuprofen    Current Outpatient Prescriptions on File Prior to Visit  Medication Sig Dispense Refill  . aspirin 81 MG EC tablet Take 81 mg by mouth daily.        . citalopram (CELEXA) 20 MG tablet TAKE 1 TABLET BY MOUTH DAILY  90 tablet  3  . cyclobenzaprine (FLEXERIL) 5 MG tablet Take 5 mg by mouth 3 (three) times daily as needed. For cramp       . donepezil (ARICEPT) 10 MG tablet Take 10 mg by mouth daily.        . famotidine (PEPCID) 40 MG tablet TAKE 1 TABLET BY MOUTH EVERY NIGHT AT BEDTIME  90 tablet  3  . HYDROcodone-acetaminophen (NORCO) 7.5-325 MG per tablet Take 1 tablet by mouth every 6 (six) hours as needed. pain       . insulin glargine (LANTUS) 100 UNIT/ML injection Inject 45 Units into the skin at bedtime.        . Insulin  Pen Needle (BD ULTRA-FINE PEN NEEDLES) 29G X 12.7MM MISC by Does not apply route as directed.        . labetalol (NORMODYNE) 200 MG tablet Take 200 mg by mouth 2 (two) times daily.        . metFORMIN (GLUCOPHAGE) 500 MG tablet Take 1 tab by mouth in the AM, and 2 tabs  By mouth in the PM  370 tablet  3  . NIFEdipine (PROCARDIA-XL/ADALAT CC) 30 MG 24 hr tablet Take 30 mg by mouth daily.        . simvastatin (ZOCOR) 40 MG tablet TAKE 1 TABLET BY MOUTH DAILY  90 tablet  3   Review of Systems Review of Systems  Constitutional: Negative for diaphoresis and unexpected weight change.  HENT: Negative for drooling and tinnitus.   Eyes: Negative for photophobia and visual disturbance.  Respiratory: Negative for choking and stridor.   Gastrointestinal: Negative for vomiting and blood in  stool.  Genitourinary: Negative for hematuria and decreased urine volume.        Objective:   Physical Exam BP 152/72  Pulse 64  Temp(Src) 98 F (36.7 C) (Oral)  Ht 5\' 6"  (1.676 m)  Wt 175 lb 8 oz (79.606 kg)  BMI 28.33 kg/m2  SpO2 97% Physical Exam  VS noted, not ill appearing Constitutional: Pt appears well-developed and well-nourished.  HENT: Head: Normocephalic.  Right Ear: External ear normal.  Left Ear: External ear normal.  Bilat tm's mild erythema.  Sinus nontender.  Pharynx mild erythema Eyes: Conjunctivae and EOM are normal. Pupils are equal, round, and reactive to light.  Neck: Normal range of motion. Neck supple.  Cardiovascular: Normal rate and regular rhythm.   Pulmonary/Chest: Effort normal and breath sounds normal.  Abd:  Soft, NT, non-distended, + BS Neurological: Pt is alert. No cranial nerve deficit.  Skin: Skin is warm. No erythema.  Psychiatric: Pt behavior is normal. Thought content c/w mod dementia     Assessment & Plan:

## 2011-01-27 NOTE — Assessment & Plan Note (Signed)
Will need urine studies to r/o uti, but may be related to worsening dementia

## 2011-02-06 ENCOUNTER — Emergency Department (HOSPITAL_COMMUNITY): Payer: Medicare PPO

## 2011-02-06 ENCOUNTER — Encounter (HOSPITAL_COMMUNITY): Payer: Self-pay

## 2011-02-06 ENCOUNTER — Emergency Department (HOSPITAL_COMMUNITY)
Admission: EM | Admit: 2011-02-06 | Discharge: 2011-02-06 | Disposition: A | Discharge status: Home / Self Care | Payer: Medicare PPO | Attending: Emergency Medicine | Admitting: Emergency Medicine

## 2011-02-06 DIAGNOSIS — K219 Gastro-esophageal reflux disease without esophagitis: Secondary | ICD-10-CM | POA: Insufficient documentation

## 2011-02-06 DIAGNOSIS — R61 Generalized hyperhidrosis: Secondary | ICD-10-CM | POA: Insufficient documentation

## 2011-02-06 DIAGNOSIS — Z79899 Other long term (current) drug therapy: Secondary | ICD-10-CM | POA: Insufficient documentation

## 2011-02-06 DIAGNOSIS — F329 Major depressive disorder, single episode, unspecified: Secondary | ICD-10-CM | POA: Insufficient documentation

## 2011-02-06 DIAGNOSIS — F3289 Other specified depressive episodes: Secondary | ICD-10-CM | POA: Insufficient documentation

## 2011-02-06 DIAGNOSIS — Z794 Long term (current) use of insulin: Secondary | ICD-10-CM | POA: Insufficient documentation

## 2011-02-06 DIAGNOSIS — I10 Essential (primary) hypertension: Secondary | ICD-10-CM | POA: Insufficient documentation

## 2011-02-06 DIAGNOSIS — E162 Hypoglycemia, unspecified: Secondary | ICD-10-CM

## 2011-02-06 DIAGNOSIS — R5381 Other malaise: Secondary | ICD-10-CM | POA: Insufficient documentation

## 2011-02-06 DIAGNOSIS — R5383 Other fatigue: Secondary | ICD-10-CM | POA: Insufficient documentation

## 2011-02-06 DIAGNOSIS — J189 Pneumonia, unspecified organism: Secondary | ICD-10-CM | POA: Insufficient documentation

## 2011-02-06 DIAGNOSIS — F068 Other specified mental disorders due to known physiological condition: Secondary | ICD-10-CM | POA: Insufficient documentation

## 2011-02-06 DIAGNOSIS — Z7982 Long term (current) use of aspirin: Secondary | ICD-10-CM | POA: Insufficient documentation

## 2011-02-06 DIAGNOSIS — E785 Hyperlipidemia, unspecified: Secondary | ICD-10-CM | POA: Insufficient documentation

## 2011-02-06 DIAGNOSIS — E1169 Type 2 diabetes mellitus with other specified complication: Secondary | ICD-10-CM | POA: Insufficient documentation

## 2011-02-06 DIAGNOSIS — J45909 Unspecified asthma, uncomplicated: Secondary | ICD-10-CM | POA: Insufficient documentation

## 2011-02-06 LAB — BASIC METABOLIC PANEL
BUN: 13 mg/dL (ref 6–23)
GFR calc Af Amer: >90 mL/min (ref >90)
GFR calc non Af Amer: 86 mL/min — ABNORMAL LOW (ref >90)
Potassium: 3.9 mEq/L (ref 3.5–5.1)
Sodium: 141 mEq/L (ref 135–145)

## 2011-02-06 LAB — URINALYSIS, ROUTINE W REFLEX MICROSCOPIC
Bilirubin Urine: NEGATIVE
Nitrite: NEGATIVE
Specific Gravity, Urine: 1.02 (ref 1.005–1.030)
Urobilinogen, UA: 0.2 mg/dL (ref 0.0–1.0)

## 2011-02-06 LAB — CBC
MCH: 23.3 pg — ABNORMAL LOW (ref 26.0–34.0)
MCHC: 30 g/dL (ref 30.0–36.0)
Platelets: 270 10*3/uL (ref 150–400)
RDW: 15.1 % (ref 11.5–15.5)

## 2011-02-06 LAB — DIFFERENTIAL
Basophils Relative: 0 % (ref 0–1)
Eosinophils Absolute: 0 10*3/uL (ref 0.0–0.7)
Neutrophils Relative %: 89 % — ABNORMAL HIGH (ref 43–77)

## 2011-02-06 LAB — GLUCOSE, CAPILLARY

## 2011-02-06 MED ORDER — INSULIN GLARGINE 100 UNIT/ML ~~LOC~~ SOLN: 45.0000 [IU] | Freq: Every day | SUBCUTANEOUS | Status: DC

## 2011-02-06 MED ORDER — DEXTROSE 5 % IV SOLN
1.0000 g | Freq: Once | INTRAVENOUS | Status: AC
  Administered 2011-02-06: 1 g via INTRAVENOUS
  Filled 2011-02-06: qty 10

## 2011-02-06 MED ORDER — MOXIFLOXACIN HCL 400 MG PO TABS: 400.0000 mg | ORAL_TABLET | Freq: Every day | ORAL | Status: AC

## 2011-02-06 MED ORDER — SODIUM CHLORIDE 0.9 % IV SOLN: INTRAVENOUS | Status: DC

## 2011-02-06 MED ORDER — AZITHROMYCIN 250 MG PO TABS
500.0000 mg | ORAL_TABLET | Freq: Once | ORAL | Status: AC
  Administered 2011-02-06: 500 mg via ORAL
  Filled 2011-02-06: qty 2

## 2011-02-06 NOTE — ED Notes (Signed)
Pt's pharmacy ([facility] on [address]) called and said pt needs prior authorization for the Avelox, so MD Effie Shy changed rx to Levaquin 500mg  1 tab PO daily.  Disp 9.

## 2011-02-06 NOTE — ED Provider Notes (Cosign Needed Addendum)
History     CSN: [id number] Arrival date & time: 02/06/2011  9:37 AM   First MD Initiated Contact with Patient 02/06/11 [phone]      Chief Complaint  Patient presents with  . Weakness    (Consider location/radiation/quality/duration/timing/severity/associated sxs/prior treatment) Patient is a 77 y.o. female presenting with weakness. The history is provided by the patient and a relative.  Weakness Primary symptoms do not include headaches or seizures. Episode onset: One month ago.  Additional symptoms include weakness. Additional symptoms do not include pain or vertigo. Associated symptoms comments: Today she developed diaphoresis, and was found to have a low blood sugar. It is, unclear if she has mistakenly  dosed  Her insulin. No anorexia, nausea, and vomiting, or urine changes..    Past Medical History  Diagnosis Date  . Diabetes mellitus type II   . Hyperlipidemia   . HTN (hypertension)   . GERD (gastroesophageal reflux disease)   . Allergic rhinitis   . Depression   . Dementia     mild  . History of CVA (cerebrovascular accident)   . LBBB (left bundle branch block)   . AAA (abdominal aortic aneurysm)     small, last check in 05/2009, due for repeat in 05/2010  . Carotid stenosis 09/2009    40-59% RICA, 0-39% LICA  . LVH (left ventricular hypertrophy) 10/2009    Moderate, EF 55-60%, no regional wall motion abnormalities  . Shellfish allergy     anaphylaxis  . Osteopenia   . Vitamin D deficiency   . Fracture of rib of left side 12/2009  . Asthma 01/27/2011    Past Surgical History  Procedure Date  . Ankle surgery     Right, by Ortho in [place]  . Tracheostomy 2010    anaphylactic episode on vent  . Tubal ligation     Family History  Problem Relation Age of Onset  . Rheum arthritis Sister   . Stroke Brother   . Hypertension Brother   . Diabetes type II Other     2 siblings    History  Substance Use Topics  . Smoking status: Current Everyday Smoker  .  Smokeless tobacco: Not on file   Comment: 3/4 ppd  . Alcohol Use: No    OB History    Grav Para Term Preterm Abortions TAB SAB Ect Mult Living                  Review of Systems  Neurological: Positive for weakness. Negative for vertigo, seizures and headaches.  All other systems reviewed and are negative.    Allergies  Ibuprofen; Shellfish-derived products; and Ace inhibitors  Home Medications   Current Outpatient Rx  Name Route Sig Dispense Refill  . ASPIRIN 81 MG PO TBEC Oral Take 81 mg by mouth daily.      Marland Kitchen CITALOPRAM HYDROBROMIDE 20 MG PO TABS Oral Take 20 mg by mouth daily.      . CYCLOBENZAPRINE HCL 5 MG PO TABS Oral Take 5 mg by mouth 3 (three) times daily as needed. For cramp     . DONEPEZIL HCL 10 MG PO TABS Oral Take 10 mg by mouth daily.     Marland Kitchen FAMOTIDINE 40 MG PO TABS Oral Take 40 mg by mouth at bedtime.      Marland Kitchen FEXOFENADINE HCL 180 MG PO TABS Oral Take 1 tablet (180 mg total) by mouth daily. 90 tablet 3  . HYDROCODONE-ACETAMINOPHEN 7.5-325 MG PO TABS Oral Take 1 tablet by  mouth every 6 (six) hours as needed. pain    . INSULIN GLARGINE 100 UNIT/ML [place] SOLN Subcutaneous Inject 45 Units into the skin at bedtime.      Marland Kitchen LABETALOL HCL 200 MG PO TABS Oral Take 200 mg by mouth 2 (two) times daily.      Marland Kitchen METFORMIN HCL 500 MG PO TABS  Take 1 tab by mouth in the AM, and 2 tabs  By mouth in the PM 370 tablet 3  . MONTELUKAST SODIUM 10 MG PO TABS Oral Take 1 tablet (10 mg total) by mouth daily. 90 tablet 3  . NIFEDIPINE ER 30 MG PO TB24 Oral Take 30 mg by mouth daily.      Marland Kitchen RISPERIDONE 0.5 MG PO TABS Oral Take 0.5 mg by mouth 2 (two) times daily.      Marland Kitchen SIMVASTATIN 40 MG PO TABS Oral Take 40 mg by mouth at bedtime.      . INSULIN PEN NEEDLE 29G X 12.7MM MISC Does not apply by Does not apply route as directed.        BP 156/54  Pulse 75  Temp(Src) 98.2 F (36.8 C) (Oral)  Resp 23  SpO2 95%  Physical Exam  Constitutional: She is oriented to person, place, and time.  She appears well-developed and well-nourished.  HENT:  Head: Normocephalic and atraumatic.  Eyes: EOM are normal. Pupils are equal, round, and reactive to light.  Neck: Normal range of motion. Neck supple.  Cardiovascular: Normal rate and regular rhythm.   Pulmonary/Chest: Effort normal and breath sounds normal. No respiratory distress. She has no wheezes. She has no rales. She exhibits no tenderness.  Abdominal: Soft. Bowel sounds are normal.  Musculoskeletal: Normal range of motion. She exhibits no edema and no tenderness.  Neurological: She is alert and oriented to person, place, and time. No cranial nerve deficit. She exhibits normal muscle tone. Coordination normal.  Skin: Skin is warm and dry.  Psychiatric: She has a normal mood and affect. Her behavior is normal. Judgment and thought content normal.    ED Course  Procedures (including critical care time)  Labs Reviewed  CBC - Abnormal; Notable for the following:    WBC 12.2 (*)    Hemoglobin 11.9 (*)    MCV 77.8 (*)    MCH 23.3 (*)    All other components within normal limits  DIFFERENTIAL - Abnormal; Notable for the following:    Neutrophils Relative 89 (*)    Neutro Abs 10.9 (*)    Lymphocytes Relative 8 (*)    Monocytes Relative 2 (*)    All other components within normal limits  BASIC METABOLIC PANEL - Abnormal; Notable for the following:    Glucose, Bld 120 (*)    GFR calc non Af Amer 86 (*)    All other components within normal limits  URINALYSIS, ROUTINE W REFLEX MICROSCOPIC  POCT CBG MONITORING  URINE CULTURE   Dg Chest Portable 1 View  02/06/2011  *RADIOLOGY REPORT*  Clinical Data: Shortness of breath, asthma.  PORTABLE CHEST - 1 VIEW  Comparison: 12/23/2009  Findings: There is hyperinflation of the lungs compatible with COPD.  Cardiomegaly.  Patchy opacities in the lungs bilaterally, right slightly greater than left.  Cannot completely exclude early asymmetric edema or infection.  No effusions.  IMPRESSION:  Cardiomegaly, COPD.  Patchy ill-defined opacities, right slightly greater than left. Cannot exclude early asymmetric edema or infection.  Original Report Authenticated By: Cyndie Chime, M.D.   ED Treatment:  IV fluids, by mouth, Avelox, IV, Rocephin. Patient ate lunch. Repeat blood sugar after lunch 120 (normal).  Reevaluation:16:22-pt is alert and comfortable. Vitals are normal     1. Pneumonia       MDM  Hypoglycemia, recurrent, with pneumonia. No apparent urinary tract infection.        Flint Melter, MD 02/06/11 1612  Flint Melter, MD 02/06/11 2115  Flint Melter, MD 02/06/11 2118

## 2011-02-06 NOTE — ED Notes (Signed)
Pt discharged with daughter. 

## 2011-02-06 NOTE — ED Notes (Signed)
CBG 63 at 13:48

## 2011-02-06 NOTE — ED Notes (Signed)
Per [facility], pt c/o generalized weakness and productive cough x1 mon, pt woke this am "in a puddle of sweat," pt clammy and cbg 63 on scene, pt ate 2 pieces jelly toast and glass of Mt Dew, sugar increased to 103 and pt sts she felt better. Daughter at bedside is concerned for possible pneumonia

## 2011-02-06 NOTE — ED Notes (Signed)
Per daughter pt has been weak, sob, chest congestion and productive cough w/gray colored sputum x1 mon, sts pt woke this am "covered in sweat." Reports increase sob w/movement x1 day. Reports chronic low back pain. Pt denies chest/abd pain or N/V/D

## 2011-02-06 NOTE — ED Notes (Signed)
X-ray at bedside

## 2011-02-07 LAB — URINE CULTURE

## 2011-02-12 ENCOUNTER — Telehealth: Payer: Self-pay | Admitting: Internal Medicine

## 2011-02-12 NOTE — Telephone Encounter (Signed)
[name], the pt's daughter, called and stated that the pt has not been taking the two metformin pills in the evening.  She stated Austin was "scared" to take pills.  The daughter reports that her blood sugars have been around 100 each morning without taking the pills.  She is hoping to get feedback from the dr on if he wants her to go ahead and take the two metformin pills at night, or to drop back to one pill, or to eliminate them completely.   Thanks!!

## 2011-02-12 NOTE — Telephone Encounter (Signed)
Patients daughter [name] informed.

## 2011-02-12 NOTE — Telephone Encounter (Signed)
Ok to cont meds as is, but should take the metformin in the AM  Last a1c July 2012 was much improved from the prior, and wt from July to nov was essentially the same

## 2011-02-12 NOTE — Telephone Encounter (Signed)
Called the patient back no answer and no VM. 

## 2011-02-14 ENCOUNTER — Other Ambulatory Visit: Payer: Self-pay | Admitting: Internal Medicine

## 2011-02-19 ENCOUNTER — Other Ambulatory Visit: Payer: Self-pay | Admitting: Internal Medicine

## 2011-02-21 ENCOUNTER — Other Ambulatory Visit: Payer: Self-pay | Admitting: Internal Medicine

## 2011-03-07 ENCOUNTER — Emergency Department (HOSPITAL_COMMUNITY)
Admission: EM | Admit: 2011-03-07 | Discharge: 2011-03-07 | Disposition: A | Discharge status: Home / Self Care | Payer: Medicare PPO | Attending: Emergency Medicine | Admitting: Emergency Medicine

## 2011-03-07 ENCOUNTER — Encounter (HOSPITAL_COMMUNITY): Payer: Self-pay | Admitting: Emergency Medicine

## 2011-03-07 ENCOUNTER — Other Ambulatory Visit: Payer: Self-pay

## 2011-03-07 ENCOUNTER — Emergency Department (HOSPITAL_COMMUNITY): Payer: Medicare PPO

## 2011-03-07 DIAGNOSIS — R05 Cough: Secondary | ICD-10-CM | POA: Insufficient documentation

## 2011-03-07 DIAGNOSIS — Z9889 Other specified postprocedural states: Secondary | ICD-10-CM | POA: Insufficient documentation

## 2011-03-07 DIAGNOSIS — Z8673 Personal history of transient ischemic attack (TIA), and cerebral infarction without residual deficits: Secondary | ICD-10-CM | POA: Insufficient documentation

## 2011-03-07 DIAGNOSIS — I10 Essential (primary) hypertension: Secondary | ICD-10-CM | POA: Insufficient documentation

## 2011-03-07 DIAGNOSIS — R109 Unspecified abdominal pain: Secondary | ICD-10-CM | POA: Insufficient documentation

## 2011-03-07 DIAGNOSIS — F17200 Nicotine dependence, unspecified, uncomplicated: Secondary | ICD-10-CM | POA: Insufficient documentation

## 2011-03-07 DIAGNOSIS — F0390 Unspecified dementia without behavioral disturbance: Secondary | ICD-10-CM | POA: Insufficient documentation

## 2011-03-07 DIAGNOSIS — I714 Abdominal aortic aneurysm, without rupture, unspecified: Secondary | ICD-10-CM | POA: Insufficient documentation

## 2011-03-07 DIAGNOSIS — E785 Hyperlipidemia, unspecified: Secondary | ICD-10-CM | POA: Insufficient documentation

## 2011-03-07 DIAGNOSIS — R197 Diarrhea, unspecified: Secondary | ICD-10-CM | POA: Insufficient documentation

## 2011-03-07 DIAGNOSIS — E119 Type 2 diabetes mellitus without complications: Secondary | ICD-10-CM | POA: Insufficient documentation

## 2011-03-07 DIAGNOSIS — N390 Urinary tract infection, site not specified: Secondary | ICD-10-CM | POA: Insufficient documentation

## 2011-03-07 DIAGNOSIS — M545 Low back pain, unspecified: Secondary | ICD-10-CM | POA: Insufficient documentation

## 2011-03-07 DIAGNOSIS — J45909 Unspecified asthma, uncomplicated: Secondary | ICD-10-CM | POA: Insufficient documentation

## 2011-03-07 DIAGNOSIS — R110 Nausea: Secondary | ICD-10-CM | POA: Insufficient documentation

## 2011-03-07 DIAGNOSIS — M79609 Pain in unspecified limb: Secondary | ICD-10-CM | POA: Insufficient documentation

## 2011-03-07 DIAGNOSIS — F3289 Other specified depressive episodes: Secondary | ICD-10-CM | POA: Insufficient documentation

## 2011-03-07 DIAGNOSIS — Z7982 Long term (current) use of aspirin: Secondary | ICD-10-CM | POA: Insufficient documentation

## 2011-03-07 DIAGNOSIS — F329 Major depressive disorder, single episode, unspecified: Secondary | ICD-10-CM | POA: Insufficient documentation

## 2011-03-07 DIAGNOSIS — K219 Gastro-esophageal reflux disease without esophagitis: Secondary | ICD-10-CM | POA: Insufficient documentation

## 2011-03-07 DIAGNOSIS — R10819 Abdominal tenderness, unspecified site: Secondary | ICD-10-CM | POA: Insufficient documentation

## 2011-03-07 DIAGNOSIS — M549 Dorsalgia, unspecified: Secondary | ICD-10-CM

## 2011-03-07 DIAGNOSIS — R059 Cough, unspecified: Secondary | ICD-10-CM | POA: Insufficient documentation

## 2011-03-07 DIAGNOSIS — Z79899 Other long term (current) drug therapy: Secondary | ICD-10-CM | POA: Insufficient documentation

## 2011-03-07 LAB — CBC
MCH: 29.2 pg (ref 26.0–34.0)
MCHC: 33.2 g/dL (ref 30.0–36.0)
Platelets: 279 10*3/uL (ref 150–400)
RBC: 4.32 MIL/uL (ref 3.87–5.11)
RDW: 13.2 % (ref 11.5–15.5)

## 2011-03-07 LAB — URINALYSIS, ROUTINE W REFLEX MICROSCOPIC
Ketones, ur: 15 mg/dL — AB
Nitrite: NEGATIVE
Protein, ur: 30 mg/dL — AB
Specific Gravity, Urine: 1.03 (ref 1.005–1.030)
Urobilinogen, UA: 1 mg/dL (ref 0.0–1.0)

## 2011-03-07 LAB — DIFFERENTIAL
Basophils Absolute: 0 10*3/uL (ref 0.0–0.1)
Basophils Relative: 0 % (ref 0–1)
Eosinophils Absolute: 0.2 10*3/uL (ref 0.0–0.7)
Neutrophils Relative %: 66 % (ref 43–77)

## 2011-03-07 LAB — COMPREHENSIVE METABOLIC PANEL
ALT: 10 U/L (ref 0–35)
Albumin: 3.6 g/dL (ref 3.5–5.2)
Alkaline Phosphatase: 67 U/L (ref 39–117)
Calcium: 9.6 mg/dL (ref 8.4–10.5)
Potassium: 4.2 mEq/L (ref 3.5–5.1)
Sodium: 134 mEq/L — ABNORMAL LOW (ref 135–145)
Total Protein: 8.4 g/dL — ABNORMAL HIGH (ref 6.0–8.3)

## 2011-03-07 LAB — URINE MICROSCOPIC-ADD ON

## 2011-03-07 LAB — LIPASE, BLOOD: Lipase: 48 U/L (ref 11–59)

## 2011-03-07 MED ORDER — HYDROCODONE-ACETAMINOPHEN 5-500 MG PO TABS: 1.0000 | ORAL_TABLET | ORAL | Status: DC | PRN

## 2011-03-07 MED ORDER — IOHEXOL 300 MG/ML  SOLN
100.0000 mL | Freq: Once | INTRAMUSCULAR | Status: AC | PRN
  Administered 2011-03-07: 100 mL via INTRAVENOUS

## 2011-03-07 MED ORDER — PREDNISONE 20 MG PO TABS: 40.0000 mg | ORAL_TABLET | Freq: Every day | ORAL | Status: DC

## 2011-03-07 MED ORDER — ONDANSETRON HCL 4 MG/2ML IJ SOLN
4.0000 mg | Freq: Once | INTRAMUSCULAR | Status: AC
  Administered 2011-03-07: 4 mg via INTRAVENOUS
  Filled 2011-03-07: qty 2

## 2011-03-07 MED ORDER — CEPHALEXIN 500 MG PO CAPS: 500.0000 mg | ORAL_CAPSULE | Freq: Four times a day (QID) | ORAL | Status: AC

## 2011-03-07 MED ORDER — MORPHINE SULFATE 4 MG/ML IJ SOLN
4.0000 mg | Freq: Once | INTRAMUSCULAR | Status: AC
  Administered 2011-03-07: 4 mg via INTRAVENOUS
  Filled 2011-03-07: qty 1

## 2011-03-07 NOTE — ED Notes (Signed)
Pt with foul smelling urine; in and out cath performed for urine sample and pt depends changed

## 2011-03-07 NOTE — ED Notes (Signed)
Per EMS: pt c/o lower back pain x 3 days with increased pain with inspiration; pt sts had BM yesterday that made pain mildly improved; pt denies obvious injury

## 2011-03-07 NOTE — ED Notes (Signed)
Resting quietly in bed. Denies pain at this time. Awaiting CT results for dispostion.

## 2011-03-07 NOTE — ED Provider Notes (Signed)
History     CSN: [id number]  Arrival date & time 03/07/11  1144   First MD Initiated Contact with Patient 03/07/11 1149      Chief Complaint  Patient presents with  . Back Pain    (Consider location/radiation/quality/duration/timing/severity/associated sxs/prior treatment) Patient is a 78 y.o. female presenting with back pain. The history is provided by the patient. No language interpreter was used.  Back Pain  The current episode started more than 2 days ago. The problem occurs hourly. The problem has not changed since onset.The pain is associated with no known injury. Pain location: Bilateral lower flanks. The quality of the pain is described as cramping and shooting. The pain radiates to the left thigh and right thigh. The pain is at a severity of 5/10. The pain is moderate. The symptoms are aggravated by bending and twisting. The pain is the same all the time. Associated symptoms include abdominal pain (lower). Pertinent negatives include no fever, no headaches and no leg pain. Treatments tried: Narcotic pain meds. The treatment provided moderate relief. Risk factors include obesity, lack of exercise and poor posture.    Past Medical History  Diagnosis Date  . Diabetes mellitus type II   . Hyperlipidemia   . HTN (hypertension)   . GERD (gastroesophageal reflux disease)   . Allergic rhinitis   . Depression   . Dementia     mild  . History of CVA (cerebrovascular accident)   . LBBB (left bundle branch block)   . AAA (abdominal aortic aneurysm)     small, last check in 05/2009, due for repeat in 05/2010  . Carotid stenosis 09/2009    40-59% RICA, 0-39% LICA  . LVH (left ventricular hypertrophy) 10/2009    Moderate, EF 55-60%, no regional wall motion abnormalities  . Shellfish allergy     anaphylaxis  . Osteopenia   . Vitamin D deficiency   . Fracture of rib of left side 12/2009  . Asthma 01/27/2011    Past Surgical History  Procedure Date  . Ankle surgery     Right,  by Ortho in [place]  . Tracheostomy 2010    anaphylactic episode on vent  . Tubal ligation     Family History  Problem Relation Age of Onset  . Rheum arthritis Sister   . Stroke Brother   . Hypertension Brother   . Diabetes type II Other     2 siblings    History  Substance Use Topics  . Smoking status: Current Everyday Smoker  . Smokeless tobacco: Not on file   Comment: 3/4 ppd  . Alcohol Use: No    OB History    Grav Para Term Preterm Abortions TAB SAB Ect Mult Living                  Review of Systems  Constitutional: Negative for fever.  Respiratory: Positive for cough and wheezing.   Gastrointestinal: Positive for nausea, abdominal pain (lower) and diarrhea. Negative for vomiting and constipation.  Musculoskeletal: Positive for back pain.  Neurological: Negative for headaches.  All other systems reviewed and are negative.    Allergies  Ibuprofen; Shellfish-derived products; and Ace inhibitors  Home Medications   Current Outpatient Rx  Name Route Sig Dispense Refill  . ASPIRIN 81 MG PO TBEC Oral Take 81 mg by mouth daily.      Marland Kitchen CITALOPRAM HYDROBROMIDE 20 MG PO TABS Oral Take 20 mg by mouth daily.      . DONEPEZIL HCL 10  MG PO TABS  TAKE ONE TABLET BY MOUTH EVERY DAY 30 tablet 11  . FAMOTIDINE 40 MG PO TABS Oral Take 40 mg by mouth at bedtime.      Marland Kitchen LABETALOL HCL 200 MG PO TABS Oral Take 200 mg by mouth 2 (two) times daily.      Marland Kitchen LANTUS SOLOSTAR 100 UNIT/ML [place] SOLN  INJECT 40 UNITS SUBCUTANEOUS AT BEDTIME 45 mL 6  . METFORMIN HCL 500 MG PO TABS Oral Take 1,500 mg by mouth every morning.      Marland Kitchen MONTELUKAST SODIUM 10 MG PO TABS Oral Take 1 tablet (10 mg total) by mouth daily. 90 tablet 3  . ADULT MULTIVITAMIN W/MINERALS CH Oral Take 1 tablet by mouth daily.      Marland Kitchen NIFEDIPINE ER 30 MG PO TB24 Oral Take 30 mg by mouth daily.      Marland Kitchen RISPERIDONE 0.5 MG PO TABS Oral Take 0.5 mg by mouth 2 (two) times daily.      Marland Kitchen SIMVASTATIN 40 MG PO TABS Oral Take 40 mg by mouth  at bedtime.      . BD PEN NEEDLE ULTRAFINE 29G X 12.7MM MISC  USE AS DIRECTED 100 each 6    BP 137/74  Pulse 85  Temp(Src) 96.9 F (36.1 C) (Oral)  Resp 16  SpO2 93%  Physical Exam  Nursing note and vitals reviewed. Constitutional: She is oriented to person, place, and time. She appears well-developed and well-nourished. No distress.  HENT:  Head: Normocephalic and atraumatic.  Eyes: EOM are normal. Pupils are equal, round, and reactive to light.  Neck: Normal range of motion. Neck supple.  Cardiovascular: Normal rate and regular rhythm.  Exam reveals no friction rub.   No murmur heard. Pulmonary/Chest: Effort normal and breath sounds normal. No respiratory distress. She has no wheezes. She has no rales.  Abdominal: Soft. She exhibits no distension. There is tenderness (lower abdominal). There is guarding (voluntary). There is no rebound.  Musculoskeletal: Normal range of motion. She exhibits no edema.       Thoracic back: She exhibits no tenderness and no bony tenderness.       Lumbar back: She exhibits tenderness (mild, bilateral lower flanks). She exhibits no bony tenderness and no deformity.  Neurological: She is alert and oriented to person, place, and time.  Skin: Skin is warm and dry. She is not diaphoretic.    ED Course  Procedures (including critical care time)  Labs Reviewed  URINALYSIS, ROUTINE W REFLEX MICROSCOPIC - Abnormal; Notable for the following:    Color, Urine AMBER (*) BIOCHEMICALS MAY BE AFFECTED BY COLOR   APPearance CLOUDY (*)    Bilirubin Urine MODERATE (*)    Ketones, ur 15 (*)    Protein, ur 30 (*)    Leukocytes, UA TRACE (*)    All other components within normal limits  DIFFERENTIAL - Abnormal; Notable for the following:    Monocytes Absolute 1.1 (*)    All other components within normal limits  COMPREHENSIVE METABOLIC PANEL - Abnormal; Notable for the following:    Sodium 134 (*)    Glucose, Bld 126 (*)    Total Protein 8.4 (*)    GFR calc  non Af Amer 80 (*)    All other components within normal limits  URINE MICROSCOPIC-ADD ON - Abnormal; Notable for the following:    Squamous Epithelial / LPF FEW (*)    Bacteria, UA FEW (*)    Casts HYALINE CASTS (*)  All other components within normal limits  CBC  LIPASE, BLOOD   No results found.   1. UTI (lower urinary tract infection)   2. Back pain       MDM  Patient is a 78 year old female who presents with back pain. Started 3 days ago, no traumatic event incited it. Patient states worsening pain with movement, twisting. Has some mild radiation down to her legs. Patient has also had some recent nausea and diarrhea.  Patient has history of a AAA, hypertension, diabetes, smoking. Patient is afebrile and vital signs are stable. Patient has noticed bony spinal tenderness. Has bilateral flank tenderness however no muscle spasms appreciated on exam. Patient has lower abdominal tenderness with occasional voluntary guarding. Lungs clear, heart sounds normal.   with history of AAA, concern for possible ruptured aneurysm. We'll obtain CT scan with contrast. Labs, EKG, chest x-ray obtained to look for other possible etiologies of her abdominal pain/back pain. Labs normal. Urine shows positive nitrite with trace leukocytes. If CT negative, will treat with antibiotics for UTI and give medications for musculoskeletal back pain.  Patient care transferred to Dr. Zebedee Iba while awaiting CT read.    Elwin Mocha, MD 03/07/11 [phone]

## 2011-03-07 NOTE — ED Provider Notes (Addendum)
I saw and evaluated the patient, reviewed the resident's note and I agree with the findings and plan.  Patient complains of back pains but she also describes abdominal bloating and a gassy feeling. On exam her vital signs are stable. She has no discrete mass on palpation.  Patient does have history of a repaired abdominal aortic aneurysm. We will plan on laboratory evaluation as well as CT imaging of her abdomen.    Date: 03/07/2011  Rate: 80  Rhythm: normal sinus rhythm  QRS Axis: normal  Intervals: normal  ST/T Wave abnormalities: normal  Conduction Disutrbances:incomplete LBBB  Narrative Interpretation:   Old EKG Reviewed: unchanged   Celene Kras, MD 03/07/11 1259  Celene Kras, MD 03/07/11 [phone]

## 2011-03-11 NOTE — ED Provider Notes (Signed)
Care assumed from Dr. Gwendolyn Grant.  Pt with h/o AAA here with back pain.  UA c/w possible UTI. CT abd/pelvis pending to eval abdominal aorta.  CT scan negative for aneurysm leaking or severe aneurysm.  There is mention of stranding w/o overt leak.  This may represent mild aortitis.  Pt reevaluated and is asx on my exam.  She has h/o back strain despite minimal activity at baseline.  She is afebrile here and has not had any recent fevers.  Back pain is transient and intermittent.  Will treat with short course of steroids in case this is mild inflammatory aortitis.  Considering how well pt clinically appears, no leukocytosis, no fever; strongly doubt infectious aortitis.  Will treat for mild uncomplicated UTI.  Findings and plan d/w pt and daughter.  They are in agreement with plan and will f/u with pcp.  They understand reasons to return.  Milus Glazier 03/11/11 1533

## 2011-03-12 ENCOUNTER — Telehealth: Payer: Self-pay

## 2011-03-12 ENCOUNTER — Ambulatory Visit: Payer: Medicare PPO | Admitting: Internal Medicine

## 2011-03-12 DIAGNOSIS — I639 Cerebral infarction, unspecified: Secondary | ICD-10-CM

## 2011-03-12 NOTE — Telephone Encounter (Signed)
Referral done

## 2011-03-12 NOTE — Telephone Encounter (Signed)
Pt's daughter called requesting home health referral and handicap form.

## 2011-03-12 NOTE — Telephone Encounter (Signed)
Informed [name] the patients daughter and mailed handicap form to their home.

## 2011-03-16 ENCOUNTER — Emergency Department (HOSPITAL_COMMUNITY): Payer: Medicare PPO

## 2011-03-16 ENCOUNTER — Other Ambulatory Visit: Payer: Self-pay

## 2011-03-16 ENCOUNTER — Encounter (HOSPITAL_COMMUNITY): Payer: Self-pay | Admitting: Emergency Medicine

## 2011-03-16 ENCOUNTER — Emergency Department (HOSPITAL_COMMUNITY)
Admission: EM | Admit: 2011-03-16 | Discharge: 2011-03-16 | Disposition: A | Discharge status: Home / Self Care | Payer: Medicare PPO | Attending: Emergency Medicine | Admitting: Emergency Medicine

## 2011-03-16 DIAGNOSIS — F0390 Unspecified dementia without behavioral disturbance: Secondary | ICD-10-CM | POA: Insufficient documentation

## 2011-03-16 DIAGNOSIS — E119 Type 2 diabetes mellitus without complications: Secondary | ICD-10-CM | POA: Insufficient documentation

## 2011-03-16 DIAGNOSIS — R059 Cough, unspecified: Secondary | ICD-10-CM | POA: Insufficient documentation

## 2011-03-16 DIAGNOSIS — I10 Essential (primary) hypertension: Secondary | ICD-10-CM | POA: Insufficient documentation

## 2011-03-16 DIAGNOSIS — J449 Chronic obstructive pulmonary disease, unspecified: Secondary | ICD-10-CM | POA: Insufficient documentation

## 2011-03-16 DIAGNOSIS — Z8673 Personal history of transient ischemic attack (TIA), and cerebral infarction without residual deficits: Secondary | ICD-10-CM | POA: Insufficient documentation

## 2011-03-16 DIAGNOSIS — R32 Unspecified urinary incontinence: Secondary | ICD-10-CM | POA: Insufficient documentation

## 2011-03-16 DIAGNOSIS — R5381 Other malaise: Secondary | ICD-10-CM | POA: Insufficient documentation

## 2011-03-16 DIAGNOSIS — R05 Cough: Secondary | ICD-10-CM | POA: Insufficient documentation

## 2011-03-16 DIAGNOSIS — J4489 Other specified chronic obstructive pulmonary disease: Secondary | ICD-10-CM | POA: Insufficient documentation

## 2011-03-16 DIAGNOSIS — R531 Weakness: Secondary | ICD-10-CM

## 2011-03-16 DIAGNOSIS — R4182 Altered mental status, unspecified: Secondary | ICD-10-CM | POA: Insufficient documentation

## 2011-03-16 LAB — URINALYSIS, ROUTINE W REFLEX MICROSCOPIC
Hgb urine dipstick: NEGATIVE
Nitrite: NEGATIVE
Protein, ur: NEGATIVE mg/dL
Specific Gravity, Urine: 1.023 (ref 1.005–1.030)
Urobilinogen, UA: 1 mg/dL (ref 0.0–1.0)

## 2011-03-16 LAB — COMPREHENSIVE METABOLIC PANEL
ALT: 9 U/L (ref 0–35)
BUN: 13 mg/dL (ref 6–23)
CO2: 22 mEq/L (ref 19–32)
Calcium: 9.4 mg/dL (ref 8.4–10.5)
Creatinine, Ser: 0.72 mg/dL (ref 0.50–1.10)
GFR calc Af Amer: >90 mL/min (ref >90)
GFR calc non Af Amer: 80 mL/min — ABNORMAL LOW (ref >90)
Glucose, Bld: 224 mg/dL — ABNORMAL HIGH (ref 70–99)
Sodium: 133 mEq/L — ABNORMAL LOW (ref 135–145)
Total Protein: 7.9 g/dL (ref 6.0–8.3)

## 2011-03-16 LAB — CBC
HCT: 33.3 % — ABNORMAL LOW (ref 36.0–46.0)
Hemoglobin: 11.4 g/dL — ABNORMAL LOW (ref 12.0–15.0)
MCH: 29.8 pg (ref 26.0–34.0)
MCHC: 34.2 g/dL (ref 30.0–36.0)
MCV: 86.9 fL (ref 78.0–100.0)
RBC: 3.83 MIL/uL — ABNORMAL LOW (ref 3.87–5.11)

## 2011-03-16 LAB — CARDIAC PANEL(CRET KIN+CKTOT+MB+TROPI)
CK, MB: 1.5 ng/mL (ref 0.3–4.0)
Total CK: 40 U/L (ref 7–177)

## 2011-03-16 LAB — LIPASE, BLOOD: Lipase: 43 U/L (ref 11–59)

## 2011-03-16 NOTE — ED Provider Notes (Signed)
History     CSN: [id number]  Arrival date & time 03/16/11  1159   First MD Initiated Contact with Patient 03/16/11 1205      No chief complaint on file.   (Consider location/radiation/quality/duration/timing/severity/associated sxs/prior treatment) The history is provided by the patient and a relative. The history is limited by the condition of the patient.  pt w hx dementia, presents w generalized weakness, poor appetite, increased frequency of episodes of urine incontinence in past 2 weeks. Was seen in ed at [facility] last week for same, daughter states no definite dx, possible uti. Past has had non productive cough/upper resp congestion in past week. No sore throat, runny nose, or other uri c/o. No fever or chills. No chest pain or sob. Smoker. Denies abd pain. Had bm yesterday. No vomiting. Poor appetite per daughter, pt denies nausea. No recent trauma or fall. Denies recent change in meds x abx for possible uti. Had appt w pcp earlier this week but skipped as wasn't feeling well.   Past Medical History  Diagnosis Date  . Diabetes mellitus type II   . Hyperlipidemia   . HTN (hypertension)   . GERD (gastroesophageal reflux disease)   . Allergic rhinitis   . Depression   . Dementia     mild  . History of CVA (cerebrovascular accident)   . LBBB (left bundle branch block)   . AAA (abdominal aortic aneurysm)     small, last check in 05/2009, due for repeat in 05/2010  . Carotid stenosis 09/2009    40-59% RICA, 0-39% LICA  . LVH (left ventricular hypertrophy) 10/2009    Moderate, EF 55-60%, no regional wall motion abnormalities  . Shellfish allergy     anaphylaxis  . Osteopenia   . Vitamin D deficiency   . Fracture of rib of left side 12/2009  . Asthma 01/27/2011    Past Surgical History  Procedure Date  . Ankle surgery     Right, by Ortho in [place]  . Tracheostomy 2010    anaphylactic episode on vent  . Tubal ligation     Family History  Problem Relation Age of Onset  .  Rheum arthritis Sister   . Stroke Brother   . Hypertension Brother   . Diabetes type II Other     2 siblings    History  Substance Use Topics  . Smoking status: Current Everyday Smoker  . Smokeless tobacco: Not on file   Comment: 3/4 ppd  . Alcohol Use: No    OB History    Grav Para Term Preterm Abortions TAB SAB Ect Mult Living                  Review of Systems  Constitutional: Negative for fever and chills.  HENT: Negative for sore throat and neck pain.   Eyes: Negative for redness.  Respiratory: Positive for cough. Negative for shortness of breath.   Cardiovascular: Negative for chest pain and leg swelling.  Gastrointestinal: Negative for vomiting, abdominal pain and diarrhea.  Genitourinary: Negative for dysuria and flank pain.  Musculoskeletal: Negative for back pain.  Skin: Negative for rash.  Neurological: Negative for headaches.  Hematological: Does not bruise/bleed easily.  Psychiatric/Behavioral: Negative for agitation.    Allergies  Ibuprofen; Shellfish-derived products; and Ace inhibitors  Home Medications   Current Outpatient Rx  Name Route Sig Dispense Refill  . ASPIRIN 81 MG PO TBEC Oral Take 81 mg by mouth daily.      . BD PEN  NEEDLE ULTRAFINE 29G X 12.7MM MISC  USE AS DIRECTED 100 each 6  . CITALOPRAM HYDROBROMIDE 20 MG PO TABS Oral Take 20 mg by mouth daily.      . DONEPEZIL HCL 10 MG PO TABS  TAKE ONE TABLET BY MOUTH EVERY DAY 30 tablet 11  . FAMOTIDINE 40 MG PO TABS Oral Take 40 mg by mouth at bedtime.      Marland Kitchen HYDROCODONE-ACETAMINOPHEN 5-500 MG PO TABS Oral Take 1 tablet by mouth every 4 (four) hours as needed for pain. 15 tablet 0  . LABETALOL HCL 200 MG PO TABS Oral Take 200 mg by mouth 2 (two) times daily.      Marland Kitchen LANTUS SOLOSTAR 100 UNIT/ML [place] SOLN  INJECT 40 UNITS SUBCUTANEOUS AT BEDTIME 45 mL 6  . METFORMIN HCL 500 MG PO TABS Oral Take 1,500 mg by mouth every morning.      Marland Kitchen MONTELUKAST SODIUM 10 MG PO TABS Oral Take 1 tablet (10 mg total)  by mouth daily. 90 tablet 3  . ADULT MULTIVITAMIN W/MINERALS CH Oral Take 1 tablet by mouth daily.      Marland Kitchen NIFEDIPINE ER 30 MG PO TB24 Oral Take 30 mg by mouth daily.      Marland Kitchen PREDNISONE 20 MG PO TABS Oral Take 2 tablets (40 mg total) by mouth daily. 10 tablet 0  . RISPERIDONE 0.5 MG PO TABS Oral Take 0.5 mg by mouth 2 (two) times daily.      Marland Kitchen SIMVASTATIN 40 MG PO TABS Oral Take 40 mg by mouth at bedtime.        There were no vitals taken for this visit.  Physical Exam  Nursing note and vitals reviewed. Constitutional: She is oriented to person, place, and time. She appears well-developed and well-nourished. No distress.  HENT:  Head: Atraumatic.  Eyes: Conjunctivae are normal. Pupils are equal, round, and reactive to light. No scleral icterus.  Neck: Normal range of motion. Neck supple. No tracheal deviation present.       No stiffness or rigidity  Cardiovascular: Normal rate, regular rhythm, normal heart sounds and intact distal pulses.   Pulmonary/Chest: Effort normal. No respiratory distress.       Non prod cough, upper resp congestion  Abdominal: Soft. Normal appearance. She exhibits no distension and no mass. There is no tenderness. There is no rebound and no guarding.  Genitourinary:       No cva tenderness. Rectal exam soft, light brown stool, heme negative  Musculoskeletal: She exhibits no edema and no tenderness.  Neurological: She is alert and oriented to person, place, and time.  Skin: Skin is warm and dry. No rash noted.  Psychiatric: She has a normal mood and affect.    ED Course  Procedures (including critical care time)   Labs Reviewed  CBC  COMPREHENSIVE METABOLIC PANEL  LIPASE, BLOOD  URINALYSIS, ROUTINE W REFLEX MICROSCOPIC  CARDIAC PANEL(CRET KIN+CKTOT+MB+TROPI)   Results for orders placed during the hospital encounter of 03/16/11  CBC      Component Value Range   WBC 11.2 (*) 4.0 - 10.5 (K/uL)   RBC 3.83 (*) 3.87 - 5.11 (MIL/uL)   Hemoglobin 11.4 (*)  12.0 - 15.0 (g/dL)   HCT 82.9 (*) 56.2 - 46.0 (%)   MCV 86.9  78.0 - 100.0 (fL)   MCH 29.8  26.0 - 34.0 (pg)   MCHC 34.2  30.0 - 36.0 (g/dL)   RDW 13.0  86.5 - 78.4 (%)   Platelets 389  150 - 400 (K/uL)  COMPREHENSIVE METABOLIC PANEL      Component Value Range   Sodium 133 (*) 135 - 145 (mEq/L)   Potassium 4.0  3.5 - 5.1 (mEq/L)   Chloride 98  96 - 112 (mEq/L)   CO2 22  19 - 32 (mEq/L)   Glucose, Bld 224 (*) 70 - 99 (mg/dL)   BUN 13  6 - 23 (mg/dL)   Creatinine, Ser 1.61  0.50 - 1.10 (mg/dL)   Calcium 9.4  8.4 - 09.6 (mg/dL)   Total Protein 7.9  6.0 - 8.3 (g/dL)   Albumin 3.1 (*) 3.5 - 5.2 (g/dL)   AST 12  0 - 37 (U/L)   ALT 9  0 - 35 (U/L)   Alkaline Phosphatase 65  39 - 117 (U/L)   Total Bilirubin 0.4  0.3 - 1.2 (mg/dL)   GFR calc non Af Amer 80 (*) >90 (mL/min)   GFR calc Af Amer >90  >90 (mL/min)  LIPASE, BLOOD      Component Value Range   Lipase 43  11 - 59 (U/L)  URINALYSIS, ROUTINE W REFLEX MICROSCOPIC      Component Value Range   Color, Urine AMBER (*) YELLOW    APPearance CLOUDY (*) CLEAR    Specific Gravity, Urine 1.023  1.005 - 1.030    pH 5.0  5.0 - 8.0    Glucose, UA NEGATIVE  NEGATIVE (mg/dL)   Hgb urine dipstick NEGATIVE  NEGATIVE    Bilirubin Urine SMALL (*) NEGATIVE    Ketones, ur NEGATIVE  NEGATIVE (mg/dL)   Protein, ur NEGATIVE  NEGATIVE (mg/dL)   Urobilinogen, UA 1.0  0.0 - 1.0 (mg/dL)   Nitrite NEGATIVE  NEGATIVE    Leukocytes, UA NEGATIVE  NEGATIVE   CARDIAC PANEL(CRET KIN+CKTOT+MB+TROPI)      Component Value Range   Total CK 40  7 - 177 (U/L)   CK, MB 1.5  0.3 - 4.0 (ng/mL)   Troponin I <0.30  <0.30 (ng/mL)   Relative Index RELATIVE INDEX IS INVALID  0.0 - 2.5    Dg Chest 2 View  03/16/2011  *RADIOLOGY REPORT*  Clinical Data: Cough and congestion.  History of smoking.  CHEST - 2 VIEW  Comparison: 02/06/2011  Findings: Lateral view degraded by patient arm position.  Apical lordotic frontal view.  Mild cardiomegaly. No pleural effusion or  pneumothorax.  Chronic interstitial thickening. No congestive failure.  Mild scarring at the bases. No lobar consolidation.  Numerous leads and wires project over the chest.  IMPRESSION: Cardiomegaly and COPD/chronic bronchitis.  No acute superimposed process.  Original Report Authenticated By: Consuello Bossier, M.D.   Ct Head Wo Contrast  03/16/2011  *RADIOLOGY REPORT*  Clinical Data: Altered mental status.  Demented.  CT HEAD WITHOUT CONTRAST  Technique:  Contiguous axial images were obtained from the base of the skull through the vertex without contrast.  Comparison: 12/23/2009  Findings: Bone windows demonstrate hypoplastic frontal sinuses. Minimal fluid in right-sided mastoid air cells.  Soft tissue windows demonstrate redemonstration of moderate to marked low density in the periventricular white matter likely related to small vessel disease. No  mass lesion, hemorrhage, hydrocephalus, acute infarct, intra-axial, or extra-axial fluid collection.  IMPRESSION:  1. No acute intracranial abnormality. 2.  Moderate to marked small vessel ischemic change. 3.  Right mastoid fluid.  Original Report Authenticated By: Consuello Bossier, M.D.   Ct Abdomen Pelvis W Contrast  03/07/2011  *RADIOLOGY REPORT*  Clinical Data: Abdominal pain and back  pain.  CT ABDOMEN AND PELVIS WITH CONTRAST  Technique:  Multidetector CT imaging of the abdomen and pelvis was performed following the standard protocol during bolus administration of intravenous contrast.  Contrast: OMNIPAQUE IOHEXOL 300 MG/ML IV SOLN  Comparison: None.  Findings: There is mild aneurysmal disease of the abdominal aorta. Initial infrarenal segment shows focal dilatation measuring up to approximately 3.2 cm in AP diameter.  The distal aorta is also mildly dilated, measuring up to 3.3 - 3.4 cm in AP diameter. There is associated stranding in the retroperitoneal fat immediately to the right of the initial aneurysmal segment of the aorta and extending to the right  posterolateral margin of the aorta.  This is nonspecific and does not have the typical appearance of aortic leak but may imply an inflammatory component.  No soft tissue gas is seen surrounding the aorta to suggest infection.  There is no evidence of aortic dissection.  The liver, gallbladder, pancreas, spleen and kidneys are unremarkable.  Left adrenal gland shows mild thickening without focal mass.  No evidence of enlarged lymph nodes.  No abnormal fluid collections.  Diverticulosis noted of the colon without evidence of diverticulitis.  Spine shows degenerative spondylosis, most prominently at L3-4.  No hernias are identified.  The bladder is unremarkable.  IMPRESSION: Mild aneurysmal disease of the abdominal aorta with maximal AP diameter of 3.4 cm in the distal aorta.  Initial infrarenal segment of focal dilatation does show surrounding stranding in the retroperitoneum which does not have the appearance of an overt leak but may imply an inflammatory component of aneurysm.  Original Report Authenticated By: Reola Calkins, M.D.      MDM  Labs. Xrays.   Reviewed nursing notes and prior charts.   Pt noted to have recent ct abd, neg for acute process, 3.4 cm aaa.     Date: 03/16/2011  Rate: 73  Rhythm: normal sinus rhythm  QRS Axis: normal  Intervals: normal  ST/T Wave abnormalities: normal  Conduction Disutrbances:incomplete LBBB  Narrative Interpretation:   Old EKG Reviewed: unchanged   Recheck pt smiling, alert, content. No distress. Pt denies any pain. occ non prod cough. Afeb. Pulse ox 99%. cxr w no infil. Discussed ct, xrays, labs w pt and fam, as well as need close pcp follow up.      Suzi Roots, MD 03/16/11 [phone]

## 2011-03-16 NOTE — Discharge Instructions (Signed)
Follow up with your doctor in the next couple days for recheck - call their office Monday morning to arrange that follow up appointment.  Return to ER if worse, new symptoms, fevers, trouble breathing, other concern.

## 2011-03-16 NOTE — ED Notes (Signed)
Pt's daughter states that her mother has not feeling well for the past 2 weeks.  Daughter states that mother has dementia. Daughter states that her  Mother is paler, has had less of an appetite and increased incontinence.  States mother has been sitting around more and has become unresponsive at times.  States that she has some lower back pain as well.  Was diagnosed with pneumonia 3 weeks ago, but cough still there and has increased SOB with exertion.

## 2011-03-19 ENCOUNTER — Telehealth: Payer: Self-pay

## 2011-03-19 NOTE — Telephone Encounter (Signed)
PT called to inform she went out to do PT assessment on the patient but the daughter refused patient being seen.  The daughter stated she needed a home health aid and would see Dr. Jonny Ruiz on Friday to discuss.

## 2011-03-19 NOTE — Telephone Encounter (Signed)
Please clarify - did the daughter or the pt refuse PT?

## 2011-03-19 NOTE — Telephone Encounter (Signed)
Sorry, the patients daughter [name].

## 2011-03-19 NOTE — Telephone Encounter (Signed)
Noted  To close note, see at OV

## 2011-03-22 ENCOUNTER — Ambulatory Visit: Payer: Medicare PPO | Admitting: Internal Medicine

## 2011-04-02 ENCOUNTER — Ambulatory Visit: Payer: Medicare PPO | Admitting: Internal Medicine

## 2011-04-08 ENCOUNTER — Other Ambulatory Visit: Payer: Self-pay | Admitting: Internal Medicine

## 2011-04-18 ENCOUNTER — Ambulatory Visit (INDEPENDENT_AMBULATORY_CARE_PROVIDER_SITE_OTHER): Payer: Medicare PPO | Admitting: Internal Medicine

## 2011-04-18 ENCOUNTER — Other Ambulatory Visit (INDEPENDENT_AMBULATORY_CARE_PROVIDER_SITE_OTHER): Payer: Medicare PPO

## 2011-04-18 ENCOUNTER — Encounter: Payer: Self-pay | Admitting: Internal Medicine

## 2011-04-18 VITALS — BP 132/68 | HR 71 | Temp 97.3°F | Ht 66.0 in | Wt 180.0 lb

## 2011-04-18 DIAGNOSIS — I10 Essential (primary) hypertension: Secondary | ICD-10-CM

## 2011-04-18 DIAGNOSIS — Z0000 Encounter for general adult medical examination without abnormal findings: Secondary | ICD-10-CM

## 2011-04-18 DIAGNOSIS — E119 Type 2 diabetes mellitus without complications: Secondary | ICD-10-CM

## 2011-04-18 DIAGNOSIS — J309 Allergic rhinitis, unspecified: Secondary | ICD-10-CM

## 2011-04-18 DIAGNOSIS — R21 Rash and other nonspecific skin eruption: Secondary | ICD-10-CM

## 2011-04-18 LAB — LIPID PANEL
HDL: 48.3 mg/dL (ref >39.00)
Total CHOL/HDL Ratio: 2
Triglycerides: 74 mg/dL (ref 0.0–149.0)
VLDL: 14.8 mg/dL (ref 0.0–40.0)

## 2011-04-18 LAB — BASIC METABOLIC PANEL
BUN: 12 mg/dL (ref 6–23)
Calcium: 9.4 mg/dL (ref 8.4–10.5)
Creatinine, Ser: 0.7 mg/dL (ref 0.4–1.2)
GFR: 91.86 mL/min (ref >60.00)
Glucose, Bld: 150 mg/dL — ABNORMAL HIGH (ref 70–99)

## 2011-04-18 MED ORDER — INSULIN GLARGINE 100 UNIT/ML ~~LOC~~ SOLN: 45.0000 [IU] | Freq: Every day | SUBCUTANEOUS | Status: DC

## 2011-04-18 MED ORDER — NYSTATIN 100000 UNIT/GM EX POWD: CUTANEOUS | Status: DC

## 2011-04-18 MED ORDER — FLUTICASONE PROPIONATE 50 MCG/ACT NA SUSP: 2.0000 | Freq: Every day | NASAL | Status: DC

## 2011-04-18 NOTE — Progress Notes (Signed)
Subjective:    Patient ID: Alicia Kent, female    DOB: 1930-09-13, 78 y.o.   MRN: [id number]  HPI  Here to f/u w2ith daughter who is supportive; pt's brother died met lung cancer last month, has been seen in ER for pna, UTI, back pain with mult studies neg for cancer; Denies worsening depressive symptoms, suicidal ideation, or panic, though has ongoing anxiety, not increased recently.  Daughter states pt sleeping more during the day, less interested in surrounding, does not particpate in groups though will converse one on one.  More hallucinations recently, mild paranoia, but not really agitated.  Some less able to shower or dress herself.  Does have what sounds like fungal rash below the breasts.     Pt denies chest pain, increased sob or doe, wheezing, orthopnea, PND, increased LE swelling, palpitations, dizziness or syncope.  Pt denies new neurological symptoms such as new headache, or facial or extremity weakness or numbness   Pt denies polydipsia, polyuria, or low sugar symptoms such as weakness or confusion improved with po intake.  Pt states overall good compliance with meds, trying to follow lower cholesterol, diabetic diet, wt overall stable.  Does have several wks ongoing nasal allergy symptoms with clear congestion, itch and sneeze, without fever, pain, ST, cough or wheezing.   Past Medical History  Diagnosis Date  . Diabetes mellitus type II   . Hyperlipidemia   . HTN (hypertension)   . GERD (gastroesophageal reflux disease)   . Allergic rhinitis   . Depression   . Dementia     mild  . History of CVA (cerebrovascular accident)   . LBBB (left bundle branch block)   . AAA (abdominal aortic aneurysm)     small, last check in 05/2009, due for repeat in 05/2010  . Carotid stenosis 09/2009    40-59% RICA, 0-39% LICA  . LVH (left ventricular hypertrophy) 10/2009    Moderate, EF 55-60%, no regional wall motion abnormalities  . Shellfish allergy     anaphylaxis  . Osteopenia   . Vitamin  d deficiency   . Fracture of rib of left side 12/2009  . Asthma 01/27/2011   Past Surgical History  Procedure Date  . Ankle surgery     Right, by Ortho in [place]  . Tracheostomy 2010    anaphylactic episode on vent  . Tubal ligation     reports that she has been smoking.  She does not have any smokeless tobacco history on file. She reports that she does not drink alcohol or use illicit drugs. family history includes Diabetes type II in her other; Hypertension in her brother; Rheum arthritis in her sister; and Stroke in her brother. Allergies  Allergen Reactions  . Ibuprofen Anaphylaxis  . Shellfish-Derived Products Anaphylaxis    Swelling tongue and throat  . Ace Inhibitors Other (See Comments)    unknown    Current Outpatient Prescriptions on File Prior to Visit  Medication Sig Dispense Refill  . aspirin 81 MG EC tablet Take 81 mg by mouth daily.        . BD ULTRA-FINE PEN NEEDLES 29G X 12.7MM MISC USE AS DIRECTED  100 each  6  . citalopram (CELEXA) 20 MG tablet Take 20 mg by mouth daily.        . cyclobenzaprine (FLEXERIL) 5 MG tablet Take 5 mg by mouth 3 (three) times daily as needed. For muscle spasms.      Marland Kitchen donepezil (ARICEPT) 10 MG tablet TAKE ONE TABLET BY  MOUTH EVERY DAY  30 tablet  11  . famotidine (PEPCID) 40 MG tablet Take 40 mg by mouth at bedtime.        Marland Kitchen HYDROcodone-acetaminophen (VICODIN) 5-500 MG per tablet Take 1 tablet by mouth every 4 (four) hours as needed. For pain.      Marland Kitchen labetalol (NORMODYNE) 200 MG tablet Take 200 mg by mouth 2 (two) times daily.        . metFORMIN (GLUCOPHAGE-XR) 500 MG 24 hr tablet Take 1,500 mg by mouth every morning.      . montelukast (SINGULAIR) 10 MG tablet Take 10 mg by mouth at bedtime.      . Multiple Vitamin (MULITIVITAMIN WITH MINERALS) TABS Take 1 tablet by mouth daily.        Marland Kitchen NIFEDICAL XL 30 MG 24 hr tablet TAKE ONE TABLET BY MOUTH DAILY  90 tablet  1  . NIFEdipine (PROCARDIA-XL/ADALAT CC) 30 MG 24 hr tablet Take 30 mg by  mouth every morning.       . risperiDONE (RISPERDAL) 0.5 MG tablet Take 0.5 mg by mouth 2 (two) times daily.        . simvastatin (ZOCOR) 40 MG tablet Take 40 mg by mouth at bedtime.         Review of Systems Review of Systems  Constitutional: Negative for diaphoresis and unexpected weight change.  HENT: Negative for drooling and tinnitus.   Eyes: Negative for photophobia and visual disturbance.  Respiratory: Negative for choking and stridor.   Gastrointestinal: Negative for vomiting and blood in stool.  Genitourinary: Negative for hematuria and decreased urine volume.    Objective:   Physical Exam BP 132/68  Pulse 71  Temp(Src) 97.3 F (36.3 C) (Oral)  Ht 5\' 6"  (1.676 m)  Wt 180 lb (81.647 kg)  BMI 29.05 kg/m2  SpO2 97% Physical Exam  VS noted Constitutional: Pt appears well-developed and well-nourished.  HENT: Head: Normocephalic.  Right Ear: External ear normal.  Left Ear: External ear normal.  Bilat tm's mild erythema.  Sinus nontender.  Pharynx mild erythema Eyes: Conjunctivae and EOM are normal. Pupils are equal, round, and reactive to light.  Neck: Normal range of motion. Neck supple.  Cardiovascular: Normal rate and regular rhythm.   Pulmonary/Chest: Effort normal and breath sounds normal.  Abd:  Soft, NT, non-distended, + BS Neurological: Pt is alert. No cranial nerve deficit.  Skin: Skin is warm. No erythema. except for erythema area between breasts, NT, no drainage or swelling Psychiatric: Pt behavior is normal. Thought content normal.     Assessment & Plan:

## 2011-04-18 NOTE — Patient Instructions (Signed)
Take all new medications as prescribed - the flonase, and the powder for the rash Continue all other medications as before Please go to LAB in the Basement for the blood and/or urine tests to be done today Please call the phone number [phone] (the PhoneTree System) for results of testing in 2-3 days;  When calling, simply dial the number, and when prompted enter the MRN number above (the Medical Record Number) and the # key, then the message should start. Please return in 6 mo with Lab testing done 3-5 days before

## 2011-04-20 ENCOUNTER — Encounter: Payer: Self-pay | Admitting: Internal Medicine

## 2011-04-20 NOTE — Assessment & Plan Note (Signed)
stable overall by hx and exam, most recent data reviewed with pt, and pt to continue medical treatment as before  BP Readings from Last 3 Encounters:  04/18/11 132/68  03/16/11 149/57  03/07/11 135/69

## 2011-04-20 NOTE — Assessment & Plan Note (Signed)
C/w prob fungal - for nystatin powder,  to f/u any worsening symptoms or concerns, consider derm referral

## 2011-04-20 NOTE — Assessment & Plan Note (Signed)
Mild to mod, for flonase asd course,  to f/u any worsening symptoms or concerns 

## 2011-04-20 NOTE — Assessment & Plan Note (Signed)
stable overall by hx and exam, most recent data reviewed with pt, and pt to continue medical treatment as before  Lab Results  Component Value Date   HGBA1C 6.3 04/18/2011

## 2011-04-25 ENCOUNTER — Other Ambulatory Visit: Payer: Self-pay | Admitting: Internal Medicine

## 2011-05-14 ENCOUNTER — Encounter: Payer: Self-pay | Admitting: Internal Medicine

## 2011-05-14 ENCOUNTER — Ambulatory Visit (INDEPENDENT_AMBULATORY_CARE_PROVIDER_SITE_OTHER): Payer: Medicare PPO | Admitting: Internal Medicine

## 2011-05-14 ENCOUNTER — Other Ambulatory Visit (INDEPENDENT_AMBULATORY_CARE_PROVIDER_SITE_OTHER): Payer: Medicare PPO

## 2011-05-14 ENCOUNTER — Ambulatory Visit (INDEPENDENT_AMBULATORY_CARE_PROVIDER_SITE_OTHER)
Admission: RE | Admit: 2011-05-14 | Discharge: 2011-05-14 | Disposition: A | Discharge status: Home / Self Care | Payer: Medicare PPO | Source: Ambulatory Visit | Attending: Internal Medicine | Admitting: Internal Medicine

## 2011-05-14 VITALS — BP 110/70 | HR 67 | Temp 97.5°F | Ht 66.0 in | Wt 172.0 lb

## 2011-05-14 DIAGNOSIS — R32 Unspecified urinary incontinence: Secondary | ICD-10-CM

## 2011-05-14 DIAGNOSIS — R531 Weakness: Secondary | ICD-10-CM

## 2011-05-14 DIAGNOSIS — R5381 Other malaise: Secondary | ICD-10-CM

## 2011-05-14 DIAGNOSIS — R059 Cough, unspecified: Secondary | ICD-10-CM

## 2011-05-14 DIAGNOSIS — E119 Type 2 diabetes mellitus without complications: Secondary | ICD-10-CM

## 2011-05-14 DIAGNOSIS — R05 Cough: Secondary | ICD-10-CM

## 2011-05-14 DIAGNOSIS — I10 Essential (primary) hypertension: Secondary | ICD-10-CM

## 2011-05-14 LAB — CBC WITH DIFFERENTIAL/PLATELET
Basophils Relative: 1.3 % (ref 0.0–3.0)
Eosinophils Absolute: 0.1 10*3/uL (ref 0.0–0.7)
Hemoglobin: 11 g/dL — ABNORMAL LOW (ref 12.0–15.0)
Lymphocytes Relative: 17.7 % (ref 12.0–46.0)
MCHC: 33.5 g/dL (ref 30.0–36.0)
Monocytes Relative: 9.9 % (ref 3.0–12.0)
Neutro Abs: 7.2 10*3/uL (ref 1.4–7.7)
RBC: 3.78 Mil/uL — ABNORMAL LOW (ref 3.87–5.11)

## 2011-05-14 LAB — BASIC METABOLIC PANEL
CO2: 25 mEq/L (ref 19–32)
Calcium: 9.3 mg/dL (ref 8.4–10.5)
Creatinine, Ser: 0.8 mg/dL (ref 0.4–1.2)
GFR: 74.63 mL/min (ref >60.00)

## 2011-05-14 LAB — HEPATIC FUNCTION PANEL
Bilirubin, Direct: 0.2 mg/dL (ref 0.0–0.3)
Total Protein: 7.9 g/dL (ref 6.0–8.3)

## 2011-05-14 MED ORDER — FUROSEMIDE 20 MG PO TABS: 20.0000 mg | ORAL_TABLET | Freq: Every day | ORAL | Status: DC

## 2011-05-14 MED ORDER — CEFTRIAXONE SODIUM 1 G IJ SOLR
1.0000 g | INTRAMUSCULAR | Status: DC
  Administered 2011-05-14: 1 g via INTRAMUSCULAR

## 2011-05-14 MED ORDER — CEPHALEXIN 500 MG PO CAPS: 500.0000 mg | ORAL_CAPSULE | Freq: Four times a day (QID) | ORAL | Status: DC

## 2011-05-14 NOTE — Assessment & Plan Note (Signed)
stable overall by hx and exam, most recent data reviewed with pt, and pt to continue medical treatment as before  Lab Results  Component Value Date   HGBA1C 6.3 04/18/2011    

## 2011-05-14 NOTE — Assessment & Plan Note (Signed)
?   Worse  , ? UTI, pt unable to give specimen and cannot do I&O cath in office or lab; will presume possible UTI - for rocephin IM, cephalexin asd,  to f/u any worsening symptoms or concerns

## 2011-05-14 NOTE — Assessment & Plan Note (Signed)
Etiology unclear, Exam otherwise benign, to check labs as documented, follow with expectant management  

## 2011-05-14 NOTE — Patient Instructions (Signed)
You had the antibiotic shot today (rocephin) Take all new medications as prescribed  - the cephalexin antibiotic Please go to LAB in the Basement for the blood and/or urine tests to be done today Please go to XRAY in the Basement for the x-ray test if you are able to do this today Please call the phone number [phone] (the [employer] System) for results of testing in 2-3 days;  When calling, simply dial the number, and when prompted enter the MRN number above (the Medical Record Number) and the # key, then the message should start. Please return in 2 weeks Please go to ER for worsening fever, SOB, chest pain, chills, weakness, falls, or stopping taking fluids

## 2011-05-14 NOTE — Assessment & Plan Note (Addendum)
With abnormal BS - few basilar rales, and mild end exp wheezing - prob acute bronchitis - for cxr if able, but may not be able to stand; for antibx as ordered, tessalon perle prn

## 2011-05-14 NOTE — Assessment & Plan Note (Signed)
stable overall by hx and exam, most recent data reviewed with pt, and pt to continue medical treatment as before  BP Readings from Last 3 Encounters:  05/14/11 110/70  04/18/11 132/68  03/16/11 149/57

## 2011-05-14 NOTE — Progress Notes (Signed)
Subjective:    Patient ID: Alicia Kent, female    DOB: 1930-08-03, 78 y.o.   MRN: [id number]  HPI  Here with 1 wk symptoms after daughter and grandson recent ill with what sounds like viral illness with cough , diarrhea;  Hx per daughter, pt more fatigued, sleeping about 90% of time, not taking many of her meds in the past wk, mild worsening sob and nonprod cough, low appetite, general weakness, some decreased po intake with fluds when she normally drinks tea often, 1 episode diarrhea/bowel incontinence, urine seems ? Darker color., has ongoing urinary incontinence - ? Mild worse but pt not helpful being able to say such as dysuria, frequency, urgency,or hematuria.  No vomiting or any noted pain.   Unable to be examined on table today, takes much assist to stand Past Medical History  Diagnosis Date  . Diabetes mellitus type II   . Hyperlipidemia   . HTN (hypertension)   . GERD (gastroesophageal reflux disease)   . Allergic rhinitis   . Depression   . Dementia     mild  . History of CVA (cerebrovascular accident)   . LBBB (left bundle branch block)   . AAA (abdominal aortic aneurysm)     small, last check in 05/2009, due for repeat in 05/2010  . Carotid stenosis 09/2009    40-59% RICA, 0-39% LICA  . LVH (left ventricular hypertrophy) 10/2009    Moderate, EF 55-60%, no regional wall motion abnormalities  . Shellfish allergy     anaphylaxis  . Osteopenia   . Vitamin d deficiency   . Fracture of rib of left side 12/2009  . Asthma 01/27/2011   Past Surgical History  Procedure Date  . Ankle surgery     Right, by Ortho in [place]  . Tracheostomy 2010    anaphylactic episode on vent  . Tubal ligation     reports that she has been smoking.  She does not have any smokeless tobacco history on file. She reports that she does not drink alcohol or use illicit drugs. family history includes Diabetes type II in her other; Hypertension in her brother; Rheum arthritis in her sister; and Stroke in her  brother. Allergies  Allergen Reactions  . Ibuprofen Anaphylaxis  . Shellfish-Derived Products Anaphylaxis    Swelling tongue and throat  . Ace Inhibitors Other (See Comments)    unknown   Current Outpatient Prescriptions on File Prior to Visit  Medication Sig Dispense Refill  . aspirin 81 MG EC tablet Take 81 mg by mouth daily.        . BD ULTRA-FINE PEN NEEDLES 29G X 12.7MM MISC USE AS DIRECTED  100 each  6  . citalopram (CELEXA) 20 MG tablet Take 20 mg by mouth daily.        . cyclobenzaprine (FLEXERIL) 5 MG tablet Take 5 mg by mouth 3 (three) times daily as needed. For muscle spasms.      Marland Kitchen donepezil (ARICEPT) 10 MG tablet TAKE ONE TABLET BY MOUTH EVERY DAY  30 tablet  11  . famotidine (PEPCID) 40 MG tablet Take 40 mg by mouth at bedtime.        . fluticasone (FLONASE) 50 MCG/ACT nasal spray Place 2 sprays into the nose daily.  16 g  2  . HYDROcodone-acetaminophen (VICODIN) 5-500 MG per tablet Take 1 tablet by mouth every 4 (four) hours as needed. For pain.      Marland Kitchen insulin glargine (LANTUS) 100 UNIT/ML injection Inject 45 Units into  the skin at bedtime.  10 mL  4  . labetalol (NORMODYNE) 200 MG tablet Take 200 mg by mouth 2 (two) times daily.        . metFORMIN (GLUCOPHAGE-XR) 500 MG 24 hr tablet TAKE 3 TABLETS BY MOUTH EVERY MORNING  270 tablet  2  . montelukast (SINGULAIR) 10 MG tablet Take 10 mg by mouth at bedtime.      . Multiple Vitamin (MULITIVITAMIN WITH MINERALS) TABS Take 1 tablet by mouth daily.        Marland Kitchen NIFEDICAL XL 30 MG 24 hr tablet TAKE ONE TABLET BY MOUTH DAILY  90 tablet  1  . NIFEdipine (PROCARDIA-XL/ADALAT CC) 30 MG 24 hr tablet Take 30 mg by mouth every morning.       . nystatin (MYCOSTATIN) powder Use as directed twice per day  15 g  1  . risperiDONE (RISPERDAL) 0.5 MG tablet Take 0.5 mg by mouth 2 (two) times daily.        . simvastatin (ZOCOR) 40 MG tablet Take 40 mg by mouth at bedtime.         No current facility-administered medications on file prior to  visit.   Review of Systems Unable due to dementia and pt not answering hx questions today    Objective:   Physical Exam BP 110/70  Pulse 67  Temp(Src) 97.5 F (36.4 C) (Oral)  Ht 5\' 6"  (1.676 m)  Wt 172 lb (78.019 kg)  BMI 27.76 kg/m2  SpO2 98% Physical Exam  VS noted Constitutional: Pt appears well-developed and well-nourished.  HENT: Head: Normocephalic.  Right Ear: External ear normal.  Left Ear: External ear normal.  Eyes: Conjunctivae and EOM are normal. Pupils are equal, round, and reactive to light.  Neck: Normal range of motion. Neck supple.  Cardiovascular: Normal rate and regular rhythm.   Pulmonary/Chest: Effort normal and breath sounds with few bibas rale right > left.  Abd:  Soft, NT, non-distended, + BS Neurological: Pt is alert. No cranial nerve deficit.  Skin: Skin is warm. No erythema.  Psychiatric: Pt behavior is normal. Not agitated, hallucinations or overly nervous;  Seems alert and tracks, smiles, but not answering questions, will cooperate with simple commands     Assessment & Plan:

## 2011-05-19 ENCOUNTER — Encounter (HOSPITAL_COMMUNITY): Payer: Self-pay

## 2011-05-19 ENCOUNTER — Other Ambulatory Visit: Payer: Self-pay

## 2011-05-19 ENCOUNTER — Emergency Department (HOSPITAL_COMMUNITY): Payer: Medicare PPO

## 2011-05-19 ENCOUNTER — Inpatient Hospital Stay (HOSPITAL_COMMUNITY)
Admission: EM | Admit: 2011-05-19 | Discharge: 2011-05-24 | DRG: 292 | Disposition: A | Discharge status: Skilled Nursing Facility | Payer: Medicare PPO | Attending: Family Medicine | Admitting: Family Medicine

## 2011-05-19 DIAGNOSIS — F068 Other specified mental disorders due to known physiological condition: Secondary | ICD-10-CM

## 2011-05-19 DIAGNOSIS — J309 Allergic rhinitis, unspecified: Secondary | ICD-10-CM

## 2011-05-19 DIAGNOSIS — D649 Anemia, unspecified: Secondary | ICD-10-CM | POA: Diagnosis present

## 2011-05-19 DIAGNOSIS — E559 Vitamin D deficiency, unspecified: Secondary | ICD-10-CM

## 2011-05-19 DIAGNOSIS — F329 Major depressive disorder, single episode, unspecified: Secondary | ICD-10-CM

## 2011-05-19 DIAGNOSIS — I10 Essential (primary) hypertension: Secondary | ICD-10-CM

## 2011-05-19 DIAGNOSIS — I447 Left bundle-branch block, unspecified: Secondary | ICD-10-CM

## 2011-05-19 DIAGNOSIS — K219 Gastro-esophageal reflux disease without esophagitis: Secondary | ICD-10-CM

## 2011-05-19 DIAGNOSIS — R252 Cramp and spasm: Secondary | ICD-10-CM

## 2011-05-19 DIAGNOSIS — R269 Unspecified abnormalities of gait and mobility: Secondary | ICD-10-CM

## 2011-05-19 DIAGNOSIS — D72829 Elevated white blood cell count, unspecified: Secondary | ICD-10-CM | POA: Diagnosis present

## 2011-05-19 DIAGNOSIS — R21 Rash and other nonspecific skin eruption: Secondary | ICD-10-CM

## 2011-05-19 DIAGNOSIS — S82899A Other fracture of unspecified lower leg, initial encounter for closed fracture: Secondary | ICD-10-CM

## 2011-05-19 DIAGNOSIS — R1011 Right upper quadrant pain: Secondary | ICD-10-CM

## 2011-05-19 DIAGNOSIS — M25569 Pain in unspecified knee: Secondary | ICD-10-CM

## 2011-05-19 DIAGNOSIS — I714 Abdominal aortic aneurysm, without rupture: Secondary | ICD-10-CM

## 2011-05-19 DIAGNOSIS — N959 Unspecified menopausal and perimenopausal disorder: Secondary | ICD-10-CM

## 2011-05-19 DIAGNOSIS — R0602 Shortness of breath: Secondary | ICD-10-CM

## 2011-05-19 DIAGNOSIS — Z0000 Encounter for general adult medical examination without abnormal findings: Secondary | ICD-10-CM

## 2011-05-19 DIAGNOSIS — I509 Heart failure, unspecified: Secondary | ICD-10-CM | POA: Diagnosis present

## 2011-05-19 DIAGNOSIS — R5383 Other fatigue: Secondary | ICD-10-CM

## 2011-05-19 DIAGNOSIS — Z9119 Patient's noncompliance with other medical treatment and regimen: Secondary | ICD-10-CM

## 2011-05-19 DIAGNOSIS — J209 Acute bronchitis, unspecified: Secondary | ICD-10-CM | POA: Diagnosis present

## 2011-05-19 DIAGNOSIS — E1169 Type 2 diabetes mellitus with other specified complication: Secondary | ICD-10-CM | POA: Diagnosis present

## 2011-05-19 DIAGNOSIS — I5031 Acute diastolic (congestive) heart failure: Principal | ICD-10-CM | POA: Diagnosis present

## 2011-05-19 DIAGNOSIS — I4891 Unspecified atrial fibrillation: Secondary | ICD-10-CM | POA: Diagnosis present

## 2011-05-19 DIAGNOSIS — F0390 Unspecified dementia without behavioral disturbance: Secondary | ICD-10-CM | POA: Diagnosis present

## 2011-05-19 DIAGNOSIS — R531 Weakness: Secondary | ICD-10-CM

## 2011-05-19 DIAGNOSIS — R05 Cough: Secondary | ICD-10-CM

## 2011-05-19 DIAGNOSIS — Z91199 Patient's noncompliance with other medical treatment and regimen due to unspecified reason: Secondary | ICD-10-CM

## 2011-05-19 DIAGNOSIS — E785 Hyperlipidemia, unspecified: Secondary | ICD-10-CM

## 2011-05-19 DIAGNOSIS — M899 Disorder of bone, unspecified: Secondary | ICD-10-CM

## 2011-05-19 DIAGNOSIS — F411 Generalized anxiety disorder: Secondary | ICD-10-CM

## 2011-05-19 DIAGNOSIS — Z8679 Personal history of other diseases of the circulatory system: Secondary | ICD-10-CM

## 2011-05-19 DIAGNOSIS — R0789 Other chest pain: Secondary | ICD-10-CM | POA: Diagnosis present

## 2011-05-19 DIAGNOSIS — E119 Type 2 diabetes mellitus without complications: Secondary | ICD-10-CM | POA: Diagnosis present

## 2011-05-19 DIAGNOSIS — S2239XA Fracture of one rib, unspecified side, initial encounter for closed fracture: Secondary | ICD-10-CM

## 2011-05-19 DIAGNOSIS — R42 Dizziness and giddiness: Secondary | ICD-10-CM

## 2011-05-19 DIAGNOSIS — E871 Hypo-osmolality and hyponatremia: Secondary | ICD-10-CM | POA: Diagnosis present

## 2011-05-19 DIAGNOSIS — Z8719 Personal history of other diseases of the digestive system: Secondary | ICD-10-CM

## 2011-05-19 LAB — URINALYSIS, ROUTINE W REFLEX MICROSCOPIC
Glucose, UA: NEGATIVE mg/dL
Hgb urine dipstick: NEGATIVE
Leukocytes, UA: NEGATIVE
Protein, ur: 30 mg/dL — AB
pH: 5.5 (ref 5.0–8.0)

## 2011-05-19 LAB — DIFFERENTIAL
Basophils Absolute: 0 10*3/uL (ref 0.0–0.1)
Basophils Relative: 0 % (ref 0–1)
Eosinophils Absolute: 0.1 10*3/uL (ref 0.0–0.7)
Monocytes Relative: 9 % (ref 3–12)
Neutro Abs: 14.8 10*3/uL — ABNORMAL HIGH (ref 1.7–7.7)
Neutrophils Relative %: 83 % — ABNORMAL HIGH (ref 43–77)

## 2011-05-19 LAB — COMPREHENSIVE METABOLIC PANEL
AST: 12 U/L (ref 0–37)
Albumin: 2.8 g/dL — ABNORMAL LOW (ref 3.5–5.2)
BUN: 8 mg/dL (ref 6–23)
Chloride: 93 mEq/L — ABNORMAL LOW (ref 96–112)
Creatinine, Ser: 0.57 mg/dL (ref 0.50–1.10)
Potassium: 3.9 mEq/L (ref 3.5–5.1)
Total Bilirubin: 0.4 mg/dL (ref 0.3–1.2)
Total Protein: 7.9 g/dL (ref 6.0–8.3)

## 2011-05-19 LAB — PRO B NATRIURETIC PEPTIDE: Pro B Natriuretic peptide (BNP): 1246 pg/mL — ABNORMAL HIGH (ref 0–450)

## 2011-05-19 LAB — CBC
MCH: 29 pg (ref 26.0–34.0)
MCHC: 34.1 g/dL (ref 30.0–36.0)
RDW: 14.2 % (ref 11.5–15.5)

## 2011-05-19 LAB — GLUCOSE, CAPILLARY
Glucose-Capillary: 226 mg/dL — ABNORMAL HIGH (ref 70–99)
Glucose-Capillary: 228 mg/dL — ABNORMAL HIGH (ref 70–99)

## 2011-05-19 LAB — CARDIAC PANEL(CRET KIN+CKTOT+MB+TROPI)
CK, MB: 6.3 ng/mL (ref 0.3–4.0)
Relative Index: 6.2 — ABNORMAL HIGH (ref 0.0–2.5)
Total CK: 101 U/L (ref 7–177)
Troponin I: <0.3 ng/mL (ref <0.30)

## 2011-05-19 LAB — PROCALCITONIN: Procalcitonin: 0.11 ng/mL

## 2011-05-19 MED ORDER — OXYCODONE HCL 5 MG PO TABS
5.0000 mg | ORAL_TABLET | ORAL | Status: DC | PRN
  Administered 2011-05-19: 5 mg via ORAL
  Filled 2011-05-19: qty 1

## 2011-05-19 MED ORDER — SODIUM CHLORIDE 0.9 % IV SOLN
Freq: Once | INTRAVENOUS | Status: AC
  Administered 2011-05-19: 17:00:00 via INTRAVENOUS

## 2011-05-19 MED ORDER — MORPHINE SULFATE 2 MG/ML IJ SOLN
2.0000 mg | Freq: Once | INTRAMUSCULAR | Status: AC
  Administered 2011-05-19: 2 mg via INTRAVENOUS
  Filled 2011-05-19: qty 1

## 2011-05-19 MED ORDER — FUROSEMIDE 10 MG/ML IJ SOLN
40.0000 mg | Freq: Once | INTRAMUSCULAR | Status: AC
  Administered 2011-05-19: 40 mg via INTRAVENOUS
  Filled 2011-05-19: qty 4

## 2011-05-19 MED ORDER — METHYLPREDNISOLONE SODIUM SUCC 125 MG IJ SOLR
125.0000 mg | Freq: Once | INTRAMUSCULAR | Status: AC
  Administered 2011-05-19: 125 mg via INTRAVENOUS
  Filled 2011-05-19: qty 2

## 2011-05-19 MED ORDER — FUROSEMIDE 10 MG/ML IJ SOLN
40.0000 mg | Freq: Two times a day (BID) | INTRAMUSCULAR | Status: DC
  Filled 2011-05-19: qty 4

## 2011-05-19 MED ORDER — INSULIN ASPART 100 UNIT/ML ~~LOC~~ SOLN: 0.0000 [IU] | Freq: Every day | SUBCUTANEOUS | Status: DC

## 2011-05-19 MED ORDER — PANTOPRAZOLE SODIUM 40 MG PO TBEC
40.0000 mg | DELAYED_RELEASE_TABLET | Freq: Every day | ORAL | Status: DC
  Administered 2011-05-20 – 2011-05-24 (×4): 40 mg via ORAL
  Filled 2011-05-19 (×7): qty 1

## 2011-05-19 MED ORDER — NITROGLYCERIN 2 % TD OINT
0.5000 [in_us] | TOPICAL_OINTMENT | Freq: Once | TRANSDERMAL | Status: AC
  Administered 2011-05-19: 0.5 [in_us] via TOPICAL
  Filled 2011-05-19: qty 30

## 2011-05-19 MED ORDER — FUROSEMIDE 10 MG/ML IJ SOLN
40.0000 mg | Freq: Two times a day (BID) | INTRAMUSCULAR | Status: DC
  Administered 2011-05-20: 40 mg via INTRAVENOUS
  Filled 2011-05-19 (×2): qty 4

## 2011-05-19 MED ORDER — MOXIFLOXACIN HCL IN NACL 400 MG/250ML IV SOLN
400.0000 mg | INTRAVENOUS | Status: DC
  Administered 2011-05-19 – 2011-05-20 (×2): 400 mg via INTRAVENOUS
  Filled 2011-05-19 (×3): qty 250

## 2011-05-19 MED ORDER — INSULIN ASPART 100 UNIT/ML ~~LOC~~ SOLN
0.0000 [IU] | Freq: Three times a day (TID) | SUBCUTANEOUS | Status: DC
  Administered 2011-05-20: 5 [IU] via SUBCUTANEOUS

## 2011-05-19 NOTE — ED Provider Notes (Signed)
History     CSN: [id number]  Arrival date & time 05/19/11  1628   First MD Initiated Contact with Patient 05/19/11 1656      Chief Complaint  Patient presents with  . Shortness of Breath  . Chest Pain  . Pneumonia  . Congestive Heart Failure    (Consider location/radiation/quality/duration/timing/severity/associated sxs/prior treatment) HPI Pt has increasing SOB, DOE, CP, productive cough x 1week. Started on augmentin and lasix by PMD but pt is non-compliant with meds. She currently is having no CP and SOB is improved with O2.  Past Medical History  Diagnosis Date  . Diabetes mellitus type II   . Hyperlipidemia   . HTN (hypertension)   . GERD (gastroesophageal reflux disease)   . Allergic rhinitis   . Depression   . Dementia     mild  . History of CVA (cerebrovascular accident)   . LBBB (left bundle branch block)   . AAA (abdominal aortic aneurysm)     small, last check in 05/2009, due for repeat in 05/2010  . Carotid stenosis 09/2009    40-59% RICA, 0-39% LICA  . LVH (left ventricular hypertrophy) 10/2009    Moderate, EF 55-60%, no regional wall motion abnormalities  . Shellfish allergy     anaphylaxis  . Osteopenia   . Vitamin d deficiency   . Fracture of rib of left side 12/2009  . Asthma 01/27/2011    Past Surgical History  Procedure Date  . Ankle surgery     Right, by Ortho in [place]  . Tracheostomy 2010    anaphylactic episode on vent  . Tubal ligation     Family History  Problem Relation Age of Onset  . Rheum arthritis Sister   . Stroke Brother   . Hypertension Brother   . Diabetes type II Other     2 siblings    History  Substance Use Topics  . Smoking status: Current Everyday Smoker  . Smokeless tobacco: Not on file   Comment: 3/4 ppd  . Alcohol Use: No    OB History    Grav Para Term Preterm Abortions TAB SAB Ect Mult Living                  Review of Systems  Constitutional: Positive for diaphoresis. Negative for fever and  chills.  Respiratory: Positive for cough and shortness of breath. Negative for wheezing.   Cardiovascular: Positive for chest pain. Negative for palpitations and leg swelling.  Gastrointestinal: Negative for nausea, vomiting, abdominal pain and diarrhea.  Genitourinary: Negative for flank pain.  Musculoskeletal: Negative for back pain and joint swelling.  Skin: Negative for color change, pallor and rash.  Neurological: Negative for weakness, numbness and headaches.    Allergies  Ibuprofen; Shellfish-derived products; and Ace inhibitors  Home Medications   Current Outpatient Rx  Name Route Sig Dispense Refill  . ASPIRIN 81 MG PO TBEC Oral Take 81 mg by mouth daily.      . BD PEN NEEDLE ULTRAFINE 29G X 12.7MM MISC  USE AS DIRECTED 100 each 6  . CEPHALEXIN 500 MG PO CAPS Oral Take 500 mg by mouth 4 (four) times daily. She is taking for 10 days. She started on 05/14/11 and has not completed.    Marland Kitchen CITALOPRAM HYDROBROMIDE 20 MG PO TABS Oral Take 20 mg by mouth daily.      . CYCLOBENZAPRINE HCL 5 MG PO TABS Oral Take 5 mg by mouth 3 (three) times daily as needed. For muscle  spasms.    . DONEPEZIL HCL 10 MG PO TABS  TAKE ONE TABLET BY MOUTH EVERY DAY 30 tablet 11  . FAMOTIDINE 40 MG PO TABS Oral Take 40 mg by mouth at bedtime.      Marland Kitchen FLUTICASONE PROPIONATE 50 MCG/ACT NA SUSP Nasal Place 2 sprays into the nose daily as needed. For allergies.    . FUROSEMIDE 20 MG PO TABS Oral Take 1 tablet (20 mg total) by mouth daily. 30 tablet 5  . HYDROCODONE-ACETAMINOPHEN 5-500 MG PO TABS Oral Take 1 tablet by mouth every 4 (four) hours as needed. For pain.    . INSULIN GLARGINE 100 UNIT/ML  SOLN Subcutaneous Inject 45 Units into the skin at bedtime. 10 mL 4  . LABETALOL HCL 200 MG PO TABS Oral Take 200 mg by mouth 2 (two) times daily.      Marland Kitchen MEMANTINE HCL 5 MG PO TABS Oral Take 5 mg by mouth 2 (two) times daily.    Marland Kitchen METFORMIN HCL ER 500 MG PO TB24  TAKE 3 TABLETS BY MOUTH EVERY MORNING 270 tablet 2  .  MONTELUKAST SODIUM 10 MG PO TABS Oral Take 10 mg by mouth at bedtime.    . ADULT MULTIVITAMIN W/MINERALS CH Oral Take 1 tablet by mouth daily.      Marland Kitchen NIFEDICAL XL 30 MG PO TB24  TAKE ONE TABLET BY MOUTH DAILY 90 tablet 1  . NYSTATIN 100000 UNIT/GM EX POWD Topical Apply 100,000 g topically 2 (two) times daily as needed. For yeast infection between breasts.    Marland Kitchen RISPERIDONE 0.5 MG PO TABS Oral Take 0.5 mg by mouth 2 (two) times daily.      Marland Kitchen SIMVASTATIN 40 MG PO TABS Oral Take 40 mg by mouth at bedtime.        BP 129/69  Pulse 98  Temp(Src) 98.4 F (36.9 C) (Oral)  Resp 23  SpO2 98%  Physical Exam  Nursing note and vitals reviewed. Constitutional: She appears well-developed and well-nourished. No distress.  HENT:  Head: Normocephalic and atraumatic.  Mouth/Throat: Oropharynx is clear and moist.  Eyes: EOM are normal. Pupils are equal, round, and reactive to light.  Neck: Normal range of motion. Neck supple.  Cardiovascular: Normal rate and regular rhythm.   Pulmonary/Chest: Effort normal. No respiratory distress. She has no wheezes. She has rales.       Crackles at bases bl  Abdominal: Soft. Bowel sounds are normal. She exhibits no mass. There is no tenderness. There is no rebound and no guarding.  Musculoskeletal: Normal range of motion. She exhibits no edema and no tenderness.  Neurological: She is alert.       Oriented to person and place. No focal deficits  Skin: Skin is warm and dry. No rash noted. No erythema.  Psychiatric: She has a normal mood and affect. Her behavior is normal.    ED Course  Procedures (including critical care time)  Labs Reviewed  GLUCOSE, CAPILLARY - Abnormal; Notable for the following:    Glucose-Capillary 226 (*)    All other components within normal limits  CBC - Abnormal; Notable for the following:    WBC 18.0 (*)    RBC 3.76 (*)    Hemoglobin 10.9 (*)    HCT 32.0 (*)    Platelets 490 (*)    All other components within normal limits    DIFFERENTIAL - Abnormal; Notable for the following:    Neutrophils Relative 83 (*)    Neutro Abs 14.8 (*)  Lymphocytes Relative 8 (*)    Monocytes Absolute 1.6 (*)    All other components within normal limits  COMPREHENSIVE METABOLIC PANEL - Abnormal; Notable for the following:    Sodium 129 (*)    Chloride 93 (*)    Glucose, Bld 211 (*)    Albumin 2.8 (*)    GFR calc non Af Amer 87 (*)    All other components within normal limits  URINALYSIS, ROUTINE W REFLEX MICROSCOPIC - Abnormal; Notable for the following:    Color, Urine AMBER (*) BIOCHEMICALS MAY BE AFFECTED BY COLOR   APPearance CLOUDY (*)    Bilirubin Urine SMALL (*)    Protein, ur 30 (*)    All other components within normal limits  CARDIAC PANEL(CRET KIN+CKTOT+MB+TROPI) - Abnormal; Notable for the following:    CK, MB 6.3 (*)    Relative Index 6.2 (*)    All other components within normal limits  PRO B NATRIURETIC PEPTIDE - Abnormal; Notable for the following:    Pro B Natriuretic peptide (BNP) 1246.0 (*)    All other components within normal limits  URINE MICROSCOPIC-ADD ON  TROPONIN I   Dg Chest 2 View  05/19/2011  *RADIOLOGY REPORT*  Clinical Data: Shortness of breath and chest pain  CHEST - 2 VIEW  Comparison: 05/14/2011  Findings: Heart size appears normal.  No pleural effusion or edema.  Chronic interstitial coarsening is noted bilaterally.  No superimposed airspace consolidation.  Review of the visualized osseous structures is significant for mild spondylosis.  IMPRESSION:  1.  No acute cardiopulmonary abnormalities. 2.  Chronic interstitial changes, similar to previous exam.  Original Report Authenticated By: Rosealee Albee, M.D.     1. SOB (shortness of breath) on exertion   2. Chest pain, atypical       Date: 05/19/2011  Rate: 73  Rhythm: normal sinus rhythm  QRS Axis: normal  Intervals: normal  ST/T Wave abnormalities: normal  Conduction Disutrbances:left bundle branch block  Narrative  Interpretation:   Old EKG Reviewed: unchanged   MDM  Pt c/o some pleuritic chest pain after moving. She has diuresed only 200-300 ml. Discussed with [facility] and relayed labs and history. Will admit to tele. Asked to have temp orders placed.         Loren Racer, MD 05/19/11 2110

## 2011-05-19 NOTE — ED Notes (Signed)
Pt c/o of chest pain 10/10 family states pt turned over and pain began- Tim RN in to evaluate this pt and chest pain orders given and results given to EDP yelverton

## 2011-05-19 NOTE — ED Notes (Signed)
Patient is resting comfortably. 

## 2011-05-19 NOTE — ED Notes (Signed)
MV[facility]:[id number]<GE> Expected date:05/19/11<BR> Expected time:<BR> Means of arrival:<BR> Comments:<BR> EMS 40 GC - sob/congestion/rales

## 2011-05-19 NOTE — H&P (Signed)
PCP:   Oliver Barre, MD, MD   Chief Complaint:   Brought in by her daughter for worsening shortness of breath.   HPI: 78 year old lady with no prior h/o CHF, brought in by her daughter for worsening shortness of breath since Monday, associated with  Cough with productive sputum. On arrival to ER she complained of substernal chest pain which has resolved with nitroglycerin. She was recently seen at her PCP office for similar complaints and was given a prescription for lasix and Augmentin. The Patient has moderate dementia and most of the history was obtained from the daughter at bedside who lives with her. As per the daughter, the patient hasn't taken any the medication prescribed by her PCP. The daughter also reports decreased po intake over the last 4 weeks, increased sleepiness. No fevers. Loose bowel movements last week, which have resolved. Pt had a nuclear stress test in 2011 and was told it was within normal limits. Pt also complains of pain along her ribs on the sides. She denies any abd pain , nausea or vomiting.   Review of Systems:  See HPI  .Past Medical History: Past Medical History  Diagnosis Date  . Diabetes mellitus type II   . Hyperlipidemia   . HTN (hypertension)   . GERD (gastroesophageal reflux disease)   . Allergic rhinitis   . Depression   . Dementia     mild  . History of CVA (cerebrovascular accident)   . LBBB (left bundle branch block)   . AAA (abdominal aortic aneurysm)     small, last check in 05/2009, due for repeat in 05/2010  . Carotid stenosis 09/2009    40-59% RICA, 0-39% LICA  . LVH (left ventricular hypertrophy) 10/2009    Moderate, EF 55-60%, no regional wall motion abnormalities  . Shellfish allergy     anaphylaxis  . Osteopenia   . Vitamin d deficiency   . Fracture of rib of left side 12/2009  . Asthma 01/27/2011   Past Surgical History  Procedure Date  . Ankle surgery     Right, by Ortho in [place]  . Tracheostomy 2010    anaphylactic episode  on vent  . Tubal ligation     Medications: Prior to Admission medications   Medication Sig Start Date End Date Taking? Authorizing Provider  aspirin 81 MG EC tablet Take 81 mg by mouth daily.     Yes Historical Provider, MD  BD ULTRA-FINE PEN NEEDLES 29G X 12.7MM MISC USE AS DIRECTED 02/19/11  Yes Corwin Levins, MD  cephALEXin (KEFLEX) 500 MG capsule Take 500 mg by mouth 4 (four) times daily. She is taking for 10 days. She started on 05/14/11 and has not completed.   Yes Corwin Levins, MD  citalopram (CELEXA) 20 MG tablet Take 20 mg by mouth daily.     Yes Historical Provider, MD  cyclobenzaprine (FLEXERIL) 5 MG tablet Take 5 mg by mouth 3 (three) times daily as needed. For muscle spasms.   Yes Historical Provider, MD  donepezil (ARICEPT) 10 MG tablet TAKE ONE TABLET BY MOUTH EVERY DAY 02/14/11  Yes Corwin Levins, MD  famotidine (PEPCID) 40 MG tablet Take 40 mg by mouth at bedtime.     Yes Historical Provider, MD  fluticasone (FLONASE) 50 MCG/ACT nasal spray Place 2 sprays into the nose daily as needed. For allergies.   Yes Corwin Levins, MD  furosemide (LASIX) 20 MG tablet Take 1 tablet (20 mg total) by mouth daily.  05/14/11 05/13/12 Yes Corwin Levins, MD  HYDROcodone-acetaminophen (VICODIN) 5-500 MG per tablet Take 1 tablet by mouth every 4 (four) hours as needed. For pain.   Yes Milus Glazier, MD  insulin glargine (LANTUS) 100 UNIT/ML injection Inject 45 Units into the skin at bedtime. 04/18/11  Yes Corwin Levins, MD  labetalol (NORMODYNE) 200 MG tablet Take 200 mg by mouth 2 (two) times daily.     Yes Historical Provider, MD  memantine (NAMENDA) 5 MG tablet Take 5 mg by mouth 2 (two) times daily.   Yes Historical Provider, MD  metFORMIN (GLUCOPHAGE-XR) 500 MG 24 hr tablet TAKE 3 TABLETS BY MOUTH EVERY MORNING 04/25/11  Yes Corwin Levins, MD  montelukast (SINGULAIR) 10 MG tablet Take 10 mg by mouth at bedtime.   Yes Corwin Levins, MD  Multiple Vitamin (MULITIVITAMIN WITH MINERALS) TABS Take 1 tablet by  mouth daily.     Yes Historical Provider, MD  NIFEDICAL XL 30 MG 24 hr tablet TAKE ONE TABLET BY MOUTH DAILY 04/08/11  Yes Corwin Levins, MD  nystatin (MYCOSTATIN) powder Apply 100,000 g topically 2 (two) times daily as needed. For yeast infection between breasts.   Yes Corwin Levins, MD  risperiDONE (RISPERDAL) 0.5 MG tablet Take 0.5 mg by mouth 2 (two) times daily.     Yes Historical Provider, MD  simvastatin (ZOCOR) 40 MG tablet Take 40 mg by mouth at bedtime.     Yes Historical Provider, MD    Allergies:   Allergies  Allergen Reactions  . Ibuprofen Anaphylaxis  . Shellfish-Derived Products Anaphylaxis    Swelling tongue and throat  . Ace Inhibitors Other (See Comments)    unknown    Social History:  reports that she has been smoking.  She does not have any smokeless tobacco history on file. She reports that she does not drink alcohol or use illicit drugs.   Family History: Family History  Problem Relation Age of Onset  . Rheum arthritis Sister   . Stroke Brother   . Hypertension Brother   . Diabetes type II Other     2 siblings    Physical Exam: Filed Vitals:   05/19/11 1947 05/19/11 2015 05/19/11 2030 05/19/11 2045  BP:  174/77 175/70 129/69  Pulse:  103  98  Temp: 98.4 F (36.9 C)     TempSrc: Oral     Resp:  24 24 23   SpO2:  100%  98%   Constitutional: Vital signs reviewed.  Patient is a well-developed and well-nourished in mild distress and cooperative with exam. Alert and oriented to person only. Head: Normocephalic and atraumatic Mouth: no erythema or exudates, MMM Eyes: PERRL, EOMI, conjunctivae normal, No scleral icterus.  Neck: Supple, Trachea midline normal ROM, No JVD, mass, thyromegaly, or carotid bruit present.  Cardiovascular: tachycardic, S1 normal, S2 normal, Pulmonary/Chest: decreased air entry and scattered rales and wheezing heard.  Abdominal: Soft. Non-tender, non-distended, bowel sounds are normal, no masses, organomegaly, or guarding present.    GU: no CVA tenderness Musculoskeletal: No joint deformities, erythema, or stiffness, ROM full and no nontender.  Neurological:Strenght is normal and symmetric bilaterally, cranial nerve II-XII are grossly intact, no focal motor deficit, sensory intact to light touch bilaterally. she is alert and oriented to person only.       Labs on Admission:   [facility] 05/19/11 1704  NA 129*  K 3.9  CL 93*  CO2 25  GLUCOSE 211*  BUN 8  CREATININE 0.57  CALCIUM  8.7  MG --  PHOS --    Basename 05/19/11 1704  AST 12  ALT 7  ALKPHOS 66  BILITOT 0.4  PROT 7.9  ALBUMIN 2.8*   No results found for this basename: LIPASE:2,AMYLASE:2 in the last 72 hours  Basename 05/19/11 1704  WBC 18.0*  NEUTROABS 14.8*  HGB 10.9*  HCT 32.0*  MCV 85.1  PLT 490*    Basename 05/19/11 2025 05/19/11 1704  CKTOTAL -- 101  CKMB -- 6.3*  CKMBINDEX -- --  TROPONINI <0.30 <0.30   No results found for this basename: TSH,T4TOTAL,FREET3,T3FREE,THYROIDAB in the last 72 hours No results found for this basename: VITAMINB12:2,FOLATE:2,FERRITIN:2,TIBC:2,IRON:2,RETICCTPCT:2 in the last 72 hours  Radiological Exams on Admission: Dg Chest 2 View  05/19/2011  *RADIOLOGY REPORT*  Clinical Data: Shortness of breath and chest pain  CHEST - 2 VIEW  Comparison: 05/14/2011  Findings: Heart size appears normal.  No pleural effusion or edema.  Chronic interstitial coarsening is noted bilaterally.  No superimposed airspace consolidation.  Review of the visualized osseous structures is significant for mild spondylosis.  IMPRESSION:  1.  No acute cardiopulmonary abnormalities. 2.  Chronic interstitial changes, similar to previous exam.  Original Report Authenticated By: Rosealee Albee, M.D.   Dg Chest 2 View  05/14/2011  *RADIOLOGY REPORT*  Clinical Data: Cough, shortness of breath  CHEST - 2 VIEW  Comparison: 03/16/2011  Findings: Stable cardiomegaly.  Mild thoracic dextroscoliosis. There is mild perihilar and bibasilar  interstitial edema or infiltrates, new since previous exam.  There are new small pleural effusions.  Minimal spurring in the mid thoracic spine.  Tortuous atheromatous thoracic aorta.  IMPRESSION:  1.  Cardiomegaly with new perihilar and bibasilar interstitial edema and small effusions suggesting CHF.  Original Report Authenticated By: Osa Craver, M.D.    Assessment/Plan Present on Admission:  .SOB (shortness of breath): probably secondary to acute CHF exacerbation with a component of COPD. We do not have a baseline pro bnp, but her probnp today is elevated with 1246. Her CXR on 3/12 shows interstitial edema with effusions suggesting CHF, with the cxr today not showing the edema. Ekg shows LBBB, which is old.  We will admit her to [facility] for closer monitoring overnight as she is tachypneic, might require the use of BIPAP overnight.  Start her on lasix 40mg  IV bid, aspirin 325mg  . Get a 2 D echocardiogram without contrast.  Serial cardiac markers 12 lead EKG in am.  Resume metoprolol, and start patient on Imdur and hydralazine at a low dose as she is allergic to ACE inhibitors.  Please call [facility] in am for further recommendations.  Also started the patient on steroids, iv solumedrol one dose tonight  with prednisone 60 mg daily to start from tomorrow, with a quick taper as she responds. Continue with Singulair  And nebulization's as needed.  BiPAP as needed overnight.   Leukocytosis: CXR does not show definite Pneumonia. UA does not indicate infection. But she has a productive cough and she hasn't actually taken the Augmentin, prescribed, so we will go ahead and treat her for possible early pneumonia vs bronchitis with Avelox.  Dementia; resume her home medications.  Anemia; normocytic. Will get an anemia panel. Hyponatremia; get serum osmo, urine osmolality, tsh and repeat labs in am.  .DIABETES MELLITUS, TYPE II; resume her home Insulin and SSI. HGBA1C ordered.    After discussion with the patient's daughter, her POA, she wishes her mom to be a FULL CODE.  We  will respect these wishes.   Time spent on this patient including examination and decision-making process: 75 minutes.  Massiah Minjares [phone] 05/19/2011, 10:50 PM

## 2011-05-19 NOTE — ED Notes (Signed)
IV Team here to attempt IV placement.

## 2011-05-19 NOTE — ED Notes (Signed)
Notified RN, Judie Bonus, RN, Lassiter and MD, Ranae Palms of chest pains. EKG given to EDP, [place].

## 2011-05-19 NOTE — ED Notes (Signed)
Per [facility]- pt presents with c/o of chest wall pain and shurtness of breath recent dx of respiratory infection- noncompliant with meds for infection and lasix for CHF.  Pt presents with no acute distress.  Rales in lower bases.  IV attempted not successful. Family present

## 2011-05-19 NOTE — ED Notes (Signed)
CKMB 6.3 critical value- given to [facility] EDP- no orders given

## 2011-05-19 NOTE — ED Notes (Signed)
Family at bedside. 

## 2011-05-19 NOTE — ED Notes (Signed)
MD at bedside. 

## 2011-05-19 NOTE — ED Notes (Signed)
Lab called with critical MB 6.3, RN  Notified and MD

## 2011-05-20 ENCOUNTER — Other Ambulatory Visit: Payer: Self-pay

## 2011-05-20 ENCOUNTER — Ambulatory Visit: Payer: Medicare PPO | Admitting: Internal Medicine

## 2011-05-20 DIAGNOSIS — Z0289 Encounter for other administrative examinations: Secondary | ICD-10-CM

## 2011-05-20 LAB — LIPID PANEL
Cholesterol: 109 mg/dL (ref 0–200)
HDL: 42 mg/dL (ref >39)
Total CHOL/HDL Ratio: 2.6 RATIO

## 2011-05-20 LAB — CARDIAC PANEL(CRET KIN+CKTOT+MB+TROPI)
CK, MB: 3.5 ng/mL (ref 0.3–4.0)
Relative Index: INVALID (ref 0.0–2.5)
Relative Index: INVALID (ref 0.0–2.5)
Troponin I: <0.3 ng/mL (ref <0.30)
Troponin I: <0.3 ng/mL (ref <0.30)

## 2011-05-20 LAB — TSH: TSH: 0.91 u[IU]/mL (ref 0.350–4.500)

## 2011-05-20 LAB — CBC
Hemoglobin: 10.7 g/dL — ABNORMAL LOW (ref 12.0–15.0)
MCH: 28.4 pg (ref 26.0–34.0)
MCHC: 32.8 g/dL (ref 30.0–36.0)
Platelets: 537 10*3/uL — ABNORMAL HIGH (ref 150–400)
RBC: 3.77 MIL/uL — ABNORMAL LOW (ref 3.87–5.11)

## 2011-05-20 LAB — FOLATE: Folate: 18.7 ng/mL

## 2011-05-20 LAB — RETICULOCYTES
RBC.: 3.77 MIL/uL — ABNORMAL LOW (ref 3.87–5.11)
Retic Count, Absolute: 56.6 10*3/uL (ref 19.0–186.0)
Retic Ct Pct: 1.5 % (ref 0.4–3.1)

## 2011-05-20 LAB — BASIC METABOLIC PANEL
BUN: 20 mg/dL (ref 6–23)
CO2: 25 mEq/L (ref 19–32)
Calcium: 8.5 mg/dL (ref 8.4–10.5)
Creatinine, Ser: 0.82 mg/dL (ref 0.50–1.10)
Glucose, Bld: 443 mg/dL — ABNORMAL HIGH (ref 70–99)

## 2011-05-20 LAB — FERRITIN: Ferritin: 347 ng/mL — ABNORMAL HIGH (ref 10–291)

## 2011-05-20 LAB — IRON AND TIBC
Saturation Ratios: 6 % — ABNORMAL LOW (ref 20–55)
UIBC: 242 ug/dL (ref 125–400)

## 2011-05-20 LAB — COMPREHENSIVE METABOLIC PANEL
AST: 10 U/L (ref 0–37)
CO2: 27 mEq/L (ref 19–32)
Calcium: 8.9 mg/dL (ref 8.4–10.5)
Chloride: 95 mEq/L — ABNORMAL LOW (ref 96–112)
Creatinine, Ser: 0.62 mg/dL (ref 0.50–1.10)
GFR calc Af Amer: >90 mL/min (ref >90)
GFR calc non Af Amer: 84 mL/min — ABNORMAL LOW (ref >90)
Glucose, Bld: 248 mg/dL — ABNORMAL HIGH (ref 70–99)
Total Bilirubin: 0.3 mg/dL (ref 0.3–1.2)

## 2011-05-20 LAB — PROTIME-INR
INR: 1.3 (ref 0.00–1.49)
Prothrombin Time: 16.4 seconds — ABNORMAL HIGH (ref 11.6–15.2)

## 2011-05-20 LAB — GLUCOSE, CAPILLARY: Glucose-Capillary: 446 mg/dL — ABNORMAL HIGH (ref 70–99)

## 2011-05-20 LAB — HEMOGLOBIN A1C: Mean Plasma Glucose: 137 mg/dL — ABNORMAL HIGH (ref <117)

## 2011-05-20 MED ORDER — MONTELUKAST SODIUM 10 MG PO TABS
10.0000 mg | ORAL_TABLET | Freq: Every day | ORAL | Status: DC
  Administered 2011-05-20 – 2011-05-23 (×4): 10 mg via ORAL
  Filled 2011-05-20 (×5): qty 1

## 2011-05-20 MED ORDER — INSULIN ASPART 100 UNIT/ML ~~LOC~~ SOLN: 20.0000 [IU] | Freq: Once | SUBCUTANEOUS | Status: DC

## 2011-05-20 MED ORDER — ISOSORBIDE MONONITRATE ER 30 MG PO TB24
30.0000 mg | ORAL_TABLET | Freq: Every day | ORAL | Status: DC
  Administered 2011-05-20 – 2011-05-24 (×5): 30 mg via ORAL
  Filled 2011-05-20 (×5): qty 1

## 2011-05-20 MED ORDER — NYSTATIN 100000 UNIT/GM EX POWD
Freq: Two times a day (BID) | CUTANEOUS | Status: DC | PRN
  Filled 2011-05-20: qty 15

## 2011-05-20 MED ORDER — NYSTATIN 100000 UNIT/GM EX POWD: 100000.0000 g | Freq: Two times a day (BID) | CUTANEOUS | Status: DC | PRN

## 2011-05-20 MED ORDER — ASPIRIN 325 MG PO TABS
325.0000 mg | ORAL_TABLET | Freq: Every day | ORAL | Status: DC
  Administered 2011-05-20 – 2011-05-24 (×5): 325 mg via ORAL
  Filled 2011-05-20 (×5): qty 1

## 2011-05-20 MED ORDER — INSULIN ASPART 100 UNIT/ML ~~LOC~~ SOLN
20.0000 [IU] | Freq: Once | SUBCUTANEOUS | Status: AC
  Administered 2011-05-20: 20 [IU] via SUBCUTANEOUS

## 2011-05-20 MED ORDER — NITROGLYCERIN 0.3 MG SL SUBL
0.3000 mg | SUBLINGUAL_TABLET | SUBLINGUAL | Status: DC | PRN
  Filled 2011-05-20: qty 100

## 2011-05-20 MED ORDER — ACETAMINOPHEN 325 MG PO TABS: 650.0000 mg | ORAL_TABLET | Freq: Four times a day (QID) | ORAL | Status: DC | PRN

## 2011-05-20 MED ORDER — HYDRALAZINE HCL 10 MG PO TABS
10.0000 mg | ORAL_TABLET | Freq: Three times a day (TID) | ORAL | Status: DC
  Administered 2011-05-20 – 2011-05-23 (×9): 10 mg via ORAL
  Filled 2011-05-20 (×13): qty 1

## 2011-05-20 MED ORDER — INSULIN ASPART 100 UNIT/ML ~~LOC~~ SOLN
24.0000 [IU] | Freq: Once | SUBCUTANEOUS | Status: AC
  Administered 2011-05-20: 24 [IU] via SUBCUTANEOUS

## 2011-05-20 MED ORDER — INSULIN GLARGINE 100 UNIT/ML ~~LOC~~ SOLN
45.0000 [IU] | Freq: Every day | SUBCUTANEOUS | Status: DC
  Administered 2011-05-20 – 2011-05-23 (×4): 45 [IU] via SUBCUTANEOUS

## 2011-05-20 MED ORDER — SIMVASTATIN 40 MG PO TABS
40.0000 mg | ORAL_TABLET | Freq: Every day | ORAL | Status: DC
  Administered 2011-05-20 – 2011-05-23 (×4): 40 mg via ORAL
  Filled 2011-05-20 (×5): qty 1

## 2011-05-20 MED ORDER — MEMANTINE HCL 5 MG PO TABS
5.0000 mg | ORAL_TABLET | Freq: Two times a day (BID) | ORAL | Status: DC
  Administered 2011-05-20 – 2011-05-24 (×10): 5 mg via ORAL
  Filled 2011-05-20 (×11): qty 1

## 2011-05-20 MED ORDER — PREDNISONE 50 MG PO TABS
50.0000 mg | ORAL_TABLET | Freq: Every day | ORAL | Status: DC
  Filled 2011-05-20: qty 1

## 2011-05-20 MED ORDER — ENOXAPARIN SODIUM 40 MG/0.4ML ~~LOC~~ SOLN
40.0000 mg | SUBCUTANEOUS | Status: DC
  Administered 2011-05-20 – 2011-05-24 (×5): 40 mg via SUBCUTANEOUS
  Filled 2011-05-20 (×5): qty 0.4

## 2011-05-20 MED ORDER — FLUTICASONE PROPIONATE 50 MCG/ACT NA SUSP
2.0000 | Freq: Every day | NASAL | Status: DC | PRN
  Filled 2011-05-20: qty 16

## 2011-05-20 MED ORDER — INSULIN ASPART 100 UNIT/ML ~~LOC~~ SOLN
0.0000 [IU] | Freq: Three times a day (TID) | SUBCUTANEOUS | Status: DC
  Administered 2011-05-21: 7 [IU] via SUBCUTANEOUS
  Administered 2011-05-21 (×2): 11 [IU] via SUBCUTANEOUS
  Administered 2011-05-22: 7 [IU] via SUBCUTANEOUS
  Administered 2011-05-22: 20 [IU] via SUBCUTANEOUS
  Administered 2011-05-23: 7 [IU] via SUBCUTANEOUS

## 2011-05-20 MED ORDER — INSULIN ASPART 100 UNIT/ML ~~LOC~~ SOLN
0.0000 [IU] | Freq: Every day | SUBCUTANEOUS | Status: DC
  Administered 2011-05-20: 4 [IU] via SUBCUTANEOUS
  Administered 2011-05-21: 5 [IU] via SUBCUTANEOUS
  Administered 2011-05-22: 4 [IU] via SUBCUTANEOUS

## 2011-05-20 MED ORDER — PREDNISONE 20 MG PO TABS
40.0000 mg | ORAL_TABLET | Freq: Every day | ORAL | Status: DC
  Administered 2011-05-21: 40 mg via ORAL
  Filled 2011-05-20: qty 2

## 2011-05-20 MED ORDER — METOPROLOL TARTRATE 50 MG PO TABS
50.0000 mg | ORAL_TABLET | Freq: Two times a day (BID) | ORAL | Status: DC
  Administered 2011-05-20 – 2011-05-24 (×10): 50 mg via ORAL
  Filled 2011-05-20 (×11): qty 1

## 2011-05-20 MED ORDER — ADULT MULTIVITAMIN W/MINERALS CH
1.0000 | ORAL_TABLET | Freq: Every day | ORAL | Status: DC
  Administered 2011-05-20 – 2011-05-24 (×5): 1 via ORAL
  Filled 2011-05-20 (×5): qty 1

## 2011-05-20 MED ORDER — CITALOPRAM HYDROBROMIDE 20 MG PO TABS
20.0000 mg | ORAL_TABLET | Freq: Every day | ORAL | Status: DC
  Administered 2011-05-20 – 2011-05-24 (×5): 20 mg via ORAL
  Filled 2011-05-20 (×5): qty 1

## 2011-05-20 MED ORDER — PREDNISONE 20 MG PO TABS
30.0000 mg | ORAL_TABLET | Freq: Every day | ORAL | Status: DC
  Filled 2011-05-20: qty 1

## 2011-05-20 MED ORDER — BOOST / RESOURCE BREEZE PO LIQD
1.0000 | Freq: Every day | ORAL | Status: DC
  Administered 2011-05-21 – 2011-05-23 (×3): 1 via ORAL

## 2011-05-20 MED ORDER — SODIUM CHLORIDE 0.9 % IV SOLN
INTRAVENOUS | Status: DC
  Administered 2011-05-20 (×2): via INTRAVENOUS

## 2011-05-20 MED ORDER — RISPERIDONE 0.5 MG PO TABS
0.5000 mg | ORAL_TABLET | Freq: Two times a day (BID) | ORAL | Status: DC
  Administered 2011-05-20 – 2011-05-24 (×10): 0.5 mg via ORAL
  Filled 2011-05-20 (×11): qty 1

## 2011-05-20 MED ORDER — DONEPEZIL HCL 10 MG PO TABS
10.0000 mg | ORAL_TABLET | Freq: Every day | ORAL | Status: DC
  Administered 2011-05-20 – 2011-05-23 (×4): 10 mg via ORAL
  Filled 2011-05-20 (×5): qty 1

## 2011-05-20 MED ORDER — PREDNISONE 50 MG PO TABS
60.0000 mg | ORAL_TABLET | Freq: Every day | ORAL | Status: DC
  Administered 2011-05-20: 60 mg via ORAL
  Filled 2011-05-20: qty 1

## 2011-05-20 MED ORDER — ONDANSETRON HCL 4 MG PO TABS: 4.0000 mg | ORAL_TABLET | Freq: Four times a day (QID) | ORAL | Status: DC | PRN

## 2011-05-20 MED ORDER — LEVALBUTEROL HCL 0.63 MG/3ML IN NEBU
0.6300 mg | INHALATION_SOLUTION | Freq: Four times a day (QID) | RESPIRATORY_TRACT | Status: DC | PRN
  Filled 2011-05-20: qty 3

## 2011-05-20 MED ORDER — METFORMIN HCL ER 500 MG PO TB24
500.0000 mg | ORAL_TABLET | Freq: Two times a day (BID) | ORAL | Status: DC
  Administered 2011-05-20 – 2011-05-23 (×6): 500 mg via ORAL
  Filled 2011-05-20 (×7): qty 1

## 2011-05-20 MED ORDER — ONDANSETRON HCL 4 MG/2ML IJ SOLN: 4.0000 mg | Freq: Four times a day (QID) | INTRAMUSCULAR | Status: DC | PRN

## 2011-05-20 MED ORDER — ACETAMINOPHEN 650 MG RE SUPP: 650.0000 mg | Freq: Four times a day (QID) | RECTAL | Status: DC | PRN

## 2011-05-20 NOTE — Progress Notes (Signed)
ADMITTED W/SOB.FROM HOME W/FAMILY,HAS PCP,PHARMACY. RECOMMEND PT/OT EVAL WHEN MD FEEL APPROPRIATE.NOTED CM CONS-DTR UNABLE TO CARE CONTINUE TO CARE FOR PATIENT.WILL CONTINUE TO ASSESS FOR D/C PLANS.

## 2011-05-20 NOTE — Progress Notes (Signed)
Pt blood sugar 202. MD notified no new orders at this time will continue to monitor pt. Roosvelt Maser, RN

## 2011-05-20 NOTE — Progress Notes (Signed)
INITIAL ADULT NUTRITION ASSESSMENT Date: 05/20/2011   Time: 3:38 PM Reason for Assessment: low braden  ASSESSMENT: Female 78 y.o.  Dx: SOB (shortness of breath)  Hx:  Past Medical History  Diagnosis Date  . Diabetes mellitus type II   . Hyperlipidemia   . HTN (hypertension)   . GERD (gastroesophageal reflux disease)   . Allergic rhinitis   . Depression   . Dementia     mild  . History of CVA (cerebrovascular accident)   . LBBB (left bundle branch block)   . AAA (abdominal aortic aneurysm)     small, last check in 05/2009, due for repeat in 05/2010  . Carotid stenosis 09/2009    40-59% RICA, 0-39% LICA  . LVH (left ventricular hypertrophy) 10/2009    Moderate, EF 55-60%, no regional wall motion abnormalities  . Shellfish allergy     anaphylaxis  . Osteopenia   . Vitamin d deficiency   . Fracture of rib of left side 12/2009  . Asthma 01/27/2011   Past Surgical History  Procedure Date  . Ankle surgery     Right, by Ortho in [place]  . Tracheostomy 2010    anaphylactic episode on vent  . Tubal ligation     Related Meds:  Scheduled Meds:   . sodium chloride   Intravenous Once  . aspirin  325 mg Oral Daily  . citalopram  20 mg Oral Daily  . donepezil  10 mg Oral QHS  . enoxaparin  40 mg Subcutaneous Q24H  . furosemide  40 mg Intravenous Once  . hydrALAZINE  10 mg Oral Q8H  . insulin aspart  0-20 Units Subcutaneous TID WC  . insulin aspart  0-5 Units Subcutaneous QHS  . insulin aspart  24 Units Subcutaneous Once  . insulin glargine  45 Units Subcutaneous QHS  . isosorbide mononitrate  30 mg Oral Daily  . memantine  5 mg Oral BID  . metFORMIN  500 mg Oral BID WC  . methylPREDNISolone (SOLU-MEDROL) injection  125 mg Intravenous Once  . metoprolol tartrate  50 mg Oral BID  . montelukast  10 mg Oral QHS  .  morphine injection  2 mg Intravenous Once  . moxifloxacin  400 mg Intravenous Q24H  . mulitivitamin with minerals  1 tablet Oral Daily  . nitroGLYCERIN  0.5  inch Topical Once  . pantoprazole  40 mg Oral Q0600  . predniSONE  50 mg Oral Q breakfast  . risperiDONE  0.5 mg Oral BID  . simvastatin  40 mg Oral QHS  . DISCONTD: furosemide  40 mg Intravenous Q12H  . DISCONTD: furosemide  40 mg Intravenous Q12H  . DISCONTD: insulin aspart  0-5 Units Subcutaneous QHS  . DISCONTD: insulin aspart  0-9 Units Subcutaneous TID WC  . DISCONTD: predniSONE  30 mg Oral Q breakfast  . DISCONTD: predniSONE  60 mg Oral Q breakfast   Continuous Infusions:   . sodium chloride 75 mL/hr at 05/20/11 1232   PRN Meds:.acetaminophen, acetaminophen, fluticasone, levalbuterol, nitroGLYCERIN, nystatin, ondansetron (ZOFRAN) IV, ondansetron, oxyCODONE, DISCONTD: nystatin  Ht: 5\' 6"  (167.6 cm)  Wt: 172 lb 2.9 oz (78.1 kg)  Ideal Wt: 67.4 kg % Ideal Wt: 115%  Usual Wt:  Wt Readings from Last 3 Encounters:  05/20/11 172 lb 2.9 oz (78.1 kg)  05/14/11 172 lb (78.019 kg)  04/18/11 180 lb (81.647 kg)   % Usual Wt: 95.5%  Body mass index is 27.79 kg/(m^2).  Food/Nutrition Related Hx: Poor intake 4 weeks PTA,  4.5% wt change in 1 month.  Labs:  CMP     Component Value Date/Time   NA 132* 05/20/2011 0350   K 4.4 05/20/2011 0350   CL 95* 05/20/2011 0350   CO2 27 05/20/2011 0350   GLUCOSE 248* 05/20/2011 0350   BUN 9 05/20/2011 0350   CREATININE 0.62 05/20/2011 0350   CALCIUM 8.9 05/20/2011 0350   CALCIUM 9.0 09/18/2009 2257   PROT 8.3 05/20/2011 0350   ALBUMIN 2.8* 05/20/2011 0350   AST 10 05/20/2011 0350   ALT 7 05/20/2011 0350   ALKPHOS 72 05/20/2011 0350   BILITOT 0.3 05/20/2011 0350   GFRNONAA 84* 05/20/2011 0350   GFRAA >90 05/20/2011 0350    CBC    Component Value Date/Time   WBC 22.5* 05/20/2011 0350   RBC 3.77* 05/20/2011 0350   HGB 10.7* 05/20/2011 0350   HCT 32.6* 05/20/2011 0350   PLT 537* 05/20/2011 0350   MCV 86.5 05/20/2011 0350   MCH 28.4 05/20/2011 0350   MCHC 32.8 05/20/2011 0350   RDW 14.3 05/20/2011 0350   LYMPHSABS 1.5 05/19/2011 1704   MONOABS 1.6*  05/19/2011 1704   EOSABS 0.1 05/19/2011 1704   BASOSABS 0.0 05/19/2011 1704    Intake:  75% of meals today Output:   Intake/Output Summary (Last 24 hours) at 05/20/11 1542 Last data filed at 05/20/11 1452  Gross per 24 hour  Intake   1180 ml  Output   1925 ml  Net   -745 ml   No BM since admission  Diet Order: Carb Control  Supplements/Tube Feeding:  None at this time  IVF:    sodium chloride Last Rate: 75 mL/hr at 05/20/11 1232    Estimated Nutritional Needs:   Kcal: 1800-1950 kcal Protein: 67-80g  Fluid: >2.0 L/day  Pt admitted with decreased PO intake PTA.  Daughter reports pt was not consuming foods or medications for days at a time due to lethargy and weakness.  Pt as sleeping most of the day.  Pt in room with several family members- states she had the reuben for lunch and that she has been eating well.  Does clarify that she hasn't been eating well lately.  PO improved since admission to 75% of meals. Pt not interested in resuming Ensure at this time- does not like it, but is willing to try [employer]. Daughter reports one episode of choking while taking a pill, but this has never happened before.  Has not happened since. Pt has had the same set of dentures for quite some time.  States they are worn down so she has to spend extra time chewing her foods.  NUTRITION DIAGNOSIS: -Inadequate oral intake (NI-2.1).  Status: Ongoing  RELATED TO: weakness, dementia  AS EVIDENCE BY: pt/family report poor PO for weeks PTA, sleeping most of the day  MONITORING/EVALUATION(Goals): 1. Food/Beverage; pt to consume >50% of meals consistently  EDUCATION NEEDS: -Education needs addressed with family  INTERVENTION: 1.  Supplement; Resource Breeze once daily  Dietitian #: [phone]  DOCUMENTATION CODES Per approved criteria  -Not Applicable    Loyce Dys [facility] 05/20/2011, 3:38 PM

## 2011-05-20 NOTE — Progress Notes (Signed)
Subjective:  No complains.  Objective: Filed Vitals:   05/19/11 2335 05/19/11 2336 05/20/11 0031 05/20/11 0504  BP:  111/60 114/64 153/79  Pulse:   96 75  Temp: 98.4 F (36.9 C) 98.4 F (36.9 C) 99.4 F (37.4 C) 97.6 F (36.4 C)  TempSrc: Oral Oral Oral Oral  Resp:  19 20 20   Height:   5\' 6"  (1.676 m)   Weight:   78.2 kg (172 lb 6.4 oz) 78.1 kg (172 lb 2.9 oz)  SpO2:  100% 97% 98%   Weight change:   Intake/Output Summary (Last 24 hours) at 05/20/11 1153 Last data filed at 05/20/11 1100  Gross per 24 hour  Intake    940 ml  Output   1100 ml  Net   -160 ml    General: Alert, awake, oriented x3, in no acute distress.  HEENT: No bruits, no goiter.  Heart: Regular rate and rhythm, without murmurs, rubs, gallops, - JVD Lungs: good air movement, rhonchi bilaterally. Abdomen: Soft, nontender, nondistended, positive bowel sounds.  Neuro: Grossly intact, nonfocal.   Lab Results:  [facility] 05/20/11 0350 05/19/11 1704  NA 132* 129*  K 4.4 3.9  CL 95* 93*  CO2 27 25  GLUCOSE 248* 211*  BUN 9 8  CREATININE 0.62 0.57  CALCIUM 8.9 8.7  MG -- --  PHOS -- --    Basename 05/20/11 0350 05/19/11 1704  AST 10 12  ALT 7 7  ALKPHOS 72 66  BILITOT 0.3 0.4  PROT 8.3 7.9  ALBUMIN 2.8* 2.8*   No results found for this basename: LIPASE:2,AMYLASE:2 in the last 72 hours  Basename 05/20/11 0350 05/19/11 1704  WBC 22.5* 18.0*  NEUTROABS -- 14.8*  HGB 10.7* 10.9*  HCT 32.6* 32.0*  MCV 86.5 85.1  PLT 537* 490*    Basename 05/20/11 0350 05/19/11 2025 05/19/11 1704  CKTOTAL 65 -- 101  CKMB 4.0 -- 6.3*  CKMBINDEX -- -- --  TROPONINI <0.30 <0.30 <0.30   No components found with this basename: POCBNP:3 No results found for this basename: DDIMER:2 in the last 72 hours No results found for this basename: HGBA1C:2 in the last 72 hours  Basename 05/20/11 0350  CHOL 109  HDL 42  LDLCALC 53  TRIG 71  CHOLHDL 2.6  LDLDIRECT --    Basename 05/20/11 0350  TSH 0.910    T4TOTAL --  T3FREE --  THYROIDAB --    Basename 05/20/11 0350  VITAMINB12 428  FOLATE 18.7  FERRITIN 347*  TIBC --  IRON --  RETICCTPCT 1.5    Micro Results: No results found for this or any previous visit (from the past 240 hour(s)).  Studies/Results: Dg Chest 2 View  05/19/2011  *RADIOLOGY REPORT*  Clinical Data: Shortness of breath and chest pain  CHEST - 2 VIEW  Comparison: 05/14/2011  Findings: Heart size appears normal.  No pleural effusion or edema.  Chronic interstitial coarsening is noted bilaterally.  No superimposed airspace consolidation.  Review of the visualized osseous structures is significant for mild spondylosis.  IMPRESSION:  1.  No acute cardiopulmonary abnormalities. 2.  Chronic interstitial changes, similar to previous exam.  Original Report Authenticated By: Rosealee Albee, M.D.    Medications: I have reviewed the patient's current medications.  Assessment and plan: Principal Problem: *SOB (shortness of breath): The most likely cause of her shortness of breath acute bronchitis vs COPD exacerbertion, her white count is increasing is probably secondary to the steroids. The patient has remained afebrile he  was started on Avelox. Her BNP was 1128 she is greater than 78 years old this probably her baseline BNP.  DIABETES MELLITUS, TYPE II Blood glucose high today secondary to steroids. We'll change her sliding scales resistant increased her Lantus continue monitor CBGs. Restart her metformin.  Leukocytosis: She has a white count of 22.5 yesterday was 18 she was started on steroids. She has remained afebrile we'll continue Avelox.  Dementia Stable.  Anemia Stable.  Hyponatremia She has a low chloride and hyponatremic, also her specific gravity is 1026, previously was 137 on 05/14/2011. We'll go ahead and give her IV fluids stop her Lasix and check a basic metabolic panel in the morning.     LOS: 1 day   Marinda Elk M.D. Pager:  [phone] [facility] 05/20/2011, 11:53 AM

## 2011-05-21 LAB — GLUCOSE, CAPILLARY
Glucose-Capillary: 266 mg/dL — ABNORMAL HIGH (ref 70–99)
Glucose-Capillary: 269 mg/dL — ABNORMAL HIGH (ref 70–99)
Glucose-Capillary: 352 mg/dL — ABNORMAL HIGH (ref 70–99)

## 2011-05-21 LAB — BASIC METABOLIC PANEL
BUN: 22 mg/dL (ref 6–23)
CO2: 25 mEq/L (ref 19–32)
Calcium: 8.9 mg/dL (ref 8.4–10.5)
Creatinine, Ser: 0.68 mg/dL (ref 0.50–1.10)
GFR calc non Af Amer: 82 mL/min — ABNORMAL LOW (ref >90)
Glucose, Bld: 244 mg/dL — ABNORMAL HIGH (ref 70–99)

## 2011-05-21 MED ORDER — PREDNISONE 20 MG PO TABS
30.0000 mg | ORAL_TABLET | Freq: Every day | ORAL | Status: DC
  Administered 2011-05-22: 30 mg via ORAL
  Filled 2011-05-21 (×2): qty 1

## 2011-05-21 MED ORDER — MOXIFLOXACIN HCL 400 MG PO TABS
400.0000 mg | ORAL_TABLET | Freq: Every day | ORAL | Status: DC
  Administered 2011-05-21 – 2011-05-23 (×3): 400 mg via ORAL
  Filled 2011-05-21 (×4): qty 1

## 2011-05-21 MED ORDER — MOXIFLOXACIN HCL IN NACL 400 MG/250ML IV SOLN
400.0000 mg | INTRAVENOUS | Status: DC
  Filled 2011-05-21: qty 250

## 2011-05-21 MED ORDER — FUROSEMIDE 20 MG PO TABS
20.0000 mg | ORAL_TABLET | Freq: Every day | ORAL | Status: DC
  Administered 2011-05-21 – 2011-05-24 (×4): 20 mg via ORAL
  Filled 2011-05-21 (×4): qty 1

## 2011-05-21 NOTE — Progress Notes (Signed)
Subjective:  No complains.  Objective: Filed Vitals:   05/20/11 2139 05/21/11 0500 05/21/11 0547 05/21/11 0904  BP: 151/75  163/85 163/72  Pulse: 79  73 69  Temp: 97.6 F (36.4 C)  97.6 F (36.4 C)   TempSrc: Oral  Oral   Resp: 18  20   Height:      Weight:  79.6 kg (175 lb 7.8 oz)    SpO2: 100%  99%    Weight change: 1.4 kg (3 lb 1.4 oz)  Intake/Output Summary (Last 24 hours) at 05/21/11 1125 Last data filed at 05/21/11 0548  Gross per 24 hour  Intake 849.08 ml  Output   1500 ml  Net -650.92 ml    General: Alert, awake, oriented x3, in no acute distress.  HEENT: No bruits, no goiter.  Heart: Regular rate and rhythm, without murmurs, rubs, gallops, - JVD Lungs: good air movement, rhonchi bilaterally. Abdomen: Soft, nontender, nondistended, positive bowel sounds.  Neuro: Grossly intact, nonfocal.   Lab Results:  Basename 05/21/11 0507 05/20/11 1753 05/20/11 0350 05/19/11 1704  NA 132* 130* 132* 129*  K 4.1 4.2 4.4 3.9  CL 97 94* 95* 93*  CO2 25 25 27 25   GLUCOSE 244* 443* 248* 211*  BUN 22 20 9 8   CREATININE 0.68 0.82 0.62 0.57  CALCIUM 8.9 8.5 8.9 8.7  MG -- -- -- --  PHOS -- -- -- --    Basename 05/20/11 0350 05/19/11 1704  AST 10 12  ALT 7 7  ALKPHOS 72 66  BILITOT 0.3 0.4  PROT 8.3 7.9  ALBUMIN 2.8* 2.8*   No results found for this basename: LIPASE:2,AMYLASE:2 in the last 72 hours  Basename 05/20/11 0350 05/19/11 1704  WBC 22.5* 18.0*  NEUTROABS -- 14.8*  HGB 10.7* 10.9*  HCT 32.6* 32.0*  MCV 86.5 85.1  PLT 537* 490*    Basename 05/20/11 1948 05/20/11 1201 05/20/11 0350  CKTOTAL 47 55 65  CKMB 3.1 3.5 4.0  CKMBINDEX -- -- --  TROPONINI <0.30 <0.30 <0.30   No components found with this basename: POCBNP:3 No results found for this basename: DDIMER:2 in the last 72 hours  Basename 05/20/11 0350  HGBA1C 6.4*    Basename 05/20/11 0350  CHOL 109  HDL 42  LDLCALC 53  TRIG 71  CHOLHDL 2.6  LDLDIRECT --    Basename 05/20/11 0350    TSH 0.910  T4TOTAL --  T3FREE --  THYROIDAB --    Basename 05/20/11 0350  VITAMINB12 428  FOLATE 18.7  FERRITIN 347*  TIBC 257  IRON 15*  RETICCTPCT 1.5    Micro Results: No results found for this or any previous visit (from the past 240 hour(s)).  Studies/Results: Dg Chest 2 View  05/19/2011  *RADIOLOGY REPORT*  Clinical Data: Shortness of breath and chest pain  CHEST - 2 VIEW  Comparison: 05/14/2011  Findings: Heart size appears normal.  No pleural effusion or edema.  Chronic interstitial coarsening is noted bilaterally.  No superimposed airspace consolidation.  Review of the visualized osseous structures is significant for mild spondylosis.  IMPRESSION:  1.  No acute cardiopulmonary abnormalities. 2.  Chronic interstitial changes, similar to previous exam.  Original Report Authenticated By: Rosealee Albee, M.D.    Medications: I have reviewed the patient's current medications.  Assessment and plan: Principal Problem: *SOB (shortness of breath): The most likely cause of her shortness of breath acute bronchitis. Lasix home dose, echo pending.  DIABETES MELLITUS, TYPE II Blood glucose now  well controlled. High secondary to steroids. Continue lantus and metformin.  Leukocytosis: Afebrile 2/2 to steroids.  Dementia Stable.  Anemia Stable.  Hyponatremia Resolved with stoping lasix. Chloride increasing.   LOS: 2 days   Marinda Elk M.D. Pager: [phone] [facility] 05/21/2011, 11:25 AM

## 2011-05-21 NOTE — Progress Notes (Addendum)
Clinical Social Work Department BRIEF PSYCHOSOCIAL ASSESSMENT 05/21/2011  Patient:  Alicia Kent     Account Number:  [id number]     Admit date:  05/19/2011  Clinical Social Worker:  Hattie Perch  Date/Time:  05/21/2011 04:24 PM  Referred by:  Physician  Date Referred:  05/21/2011 Referred for  SNF Placement   Other Referral:   Interview type:  Patient Other interview type:   and family at bedside    PSYCHOSOCIAL DATA Living Status:  FAMILY Admitted from facility:   Level of care:   Primary support name:   Primary support relationship to patient:  CHILD, ADULT Degree of support available:   Excellent family support available    CURRENT CONCERNS Current Concerns  Post-Acute Placement   Other Concerns:    SOCIAL WORK ASSESSMENT / PLAN CSW met with patient and patients family at bedside. Patients family concerned that they can no longer meet patients level of care. Discussed need for SNF placement. Explained faxing out process and authorization from [employer] process.   Assessment/plan status:   Other assessment/ plan:   Information/referral to community resources:   referred family to DSS to apply for medicaid for patient.    PATIENT'S/FAMILY'S RESPONSE TO PLAN OF CARE: Agreeable to SNF and interested in Short to Long term care.    Alicia Kent MSW, LCSW [phone]

## 2011-05-21 NOTE — Evaluation (Signed)
Physical Therapy Evaluation Patient Details Name: Alicia Kent MRN: [id number] DOB: 01/19/1931 Today's Date: 05/21/2011  Problem List:  Patient Active Problem List  Diagnoses  . DIABETES MELLITUS, TYPE II  . VITAMIN D DEFICIENCY  . HYPERLIPIDEMIA  . DEMENTIA  . ANXIETY  . DEPRESSION  . HYPERTENSION  . LBBB  . AAA  . ALLERGIC RHINITIS  . GERD  . MENOPAUSAL DISORDER  . KNEE PAIN, LEFT, ACUTE  . CRAMP IN LIMB  . OSTEOPENIA  . Dizziness and giddiness  . FATIGUE  . GAIT DISTURBANCE  . FRACTURE, RIB, LEFT  . FRACTURE, ANKLE, RIGHT  . CEREBROVASCULAR ACCIDENT, HX OF  . CLOSTRIDIUM DIFFICILE COLITIS, HX OF  . Abdominal pain, right upper quadrant  . Preventative health care  . DOE (dyspnea on exertion)  . Urinary incontinence  . Asthma  . Rash  . Cough  . Weakness generalized  . SOB (shortness of breath)  . Leukocytosis  . Dementia  . Anemia  . Hyponatremia    Past Medical History:  Past Medical History  Diagnosis Date  . Diabetes mellitus type II   . Hyperlipidemia   . HTN (hypertension)   . GERD (gastroesophageal reflux disease)   . Allergic rhinitis   . Depression   . Dementia     mild  . History of CVA (cerebrovascular accident)   . LBBB (left bundle branch block)   . AAA (abdominal aortic aneurysm)     small, last check in 05/2009, due for repeat in 05/2010  . Carotid stenosis 09/2009    40-59% RICA, 0-39% LICA  . LVH (left ventricular hypertrophy) 10/2009    Moderate, EF 55-60%, no regional wall motion abnormalities  . Shellfish allergy     anaphylaxis  . Osteopenia   . Vitamin d deficiency   . Fracture of rib of left side 12/2009  . Asthma 01/27/2011   Past Surgical History:  Past Surgical History  Procedure Date  . Ankle surgery     Right, by Ortho in [place]  . Tracheostomy 2010    anaphylactic episode on vent  . Tubal ligation     PT Assessment/Plan/Recommendation PT Assessment Clinical Impression Statement: Pt presents with SOB and history  of moderate dementia with decreased mobility and endurance.  Tolerated ambulation well with cues for safety and sequencing/placement.  Pt will benefit from skilled PT in acute venue to address deficits.  PT discussed D/C options with family who states that they will not be able to provide approapriate amount of care, therefore PT recommends SNF for follow up therapy at D/C to increase pt safety and decrease burden of care.   PT Recommendation/Assessment: Patient will need skilled PT in the acute care venue PT Problem List: Decreased strength;Decreased activity tolerance;Decreased balance;Decreased mobility;Decreased knowledge of use of DME;Decreased cognition;Decreased safety awareness Barriers to Discharge: Decreased caregiver support PT Therapy Diagnosis : Difficulty walking;Generalized weakness;Abnormality of gait PT Plan PT Frequency: Min 3X/week PT Treatment/Interventions: DME instruction;Gait training;Functional mobility training;Therapeutic activities;Therapeutic exercise;Patient/family education PT Recommendation Recommendations for Other Services: OT consult Follow Up Recommendations: Skilled nursing facility Equipment Recommended: Defer to next venue PT Goals  Acute Rehab PT Goals PT Goal Formulation: With patient Time For Goal Achievement: 2 weeks Pt will go Supine/Side to Sit: with supervision PT Goal: Supine/Side to Sit - Progress: Goal set today Pt will go Sit to Supine/Side: with supervision PT Goal: Sit to Supine/Side - Progress: Goal set today Pt will go Sit to Stand: with supervision PT Goal: Sit to Stand -  Progress: Goal set today Pt will go Stand to Sit: with supervision PT Goal: Stand to Sit - Progress: Goal set today Pt will Ambulate: >150 feet;with supervision;with least restrictive assistive device PT Goal: Ambulate - Progress: Goal set today  PT Evaluation Precautions/Restrictions  Precautions Precautions: Fall Required Braces or Orthoses:  No Restrictions Weight Bearing Restrictions: No Prior Functioning  Home Living Lives With: Daughter Receives Help From: Family Type of Home: House Home Layout: One level Home Access: Ramped entrance Home Adaptive Equipment: Bedside commode/3-in-1;Straight cane;Walker - rolling;Wheelchair - manual;Grab bars around toilet Prior Function Level of Independence: Requires assistive device for independence;Needs assistance with ADLs;Independent with transfers Driving: No Vocation: Retired Producer, television/film/video: Awake/alert Overall Cognitive Status: Appears within functional limits for tasks assessed Orientation Level: Oriented X4 Sensation/Coordination Sensation Light Touch: Appears Intact Coordination Gross Motor Movements are Fluid and Coordinated: Yes Extremity Assessment RLE Assessment RLE Assessment: Exceptions to [facility] RLE Strength RLE Overall Strength Comments: Grossly WFL per functional assessment.  LLE Assessment LLE Assessment: Exceptions to [facility] LLE Strength LLE Overall Strength Comments: Grossly WFL per functional assessment.  Mobility (including Balance) Bed Mobility Bed Mobility: Yes Supine to Sit: 1: +2 Total assist;Patient percentage (comment) Supine to Sit Details (indicate cue type and reason): Pt assist 65%.  +2 for safety with cues for hand placement and encouragement to complete task.  Transfers Transfers: Yes Sit to Stand: 1: +2 Total assist;Patient percentage (comment);From elevated surface;With upper extremity assist;From bed;From chair/3-in-1;With armrests Sit to Stand Details (indicate cue type and reason): Pt assist 70%.  +2 for safety and line management with cues for hand placement and safety.  Performed x 2 from bed and from 3in1.  Stand to Sit: 1: +2 Total assist;Patient percentage (comment);With upper extremity assist;With armrests;To chair/3-in-1 Stand to Sit Details: Pt assist 75%.  Some assist for controlled descent with cues for  hand placement and safety.  Performed x 2 to 3in1 and to chair.  Ambulation/Gait Ambulation/Gait: Yes Ambulation/Gait Assistance: 1: +2 Total assist;Patient percentage (comment) Ambulation/Gait Assistance Details (indicate cue type and reason): Pt assist 75%.  Some assist for steadying with cues for RW placement and for upright posture.  Ambulation Distance (Feet): 80 Feet Assistive device: Rolling walker Gait Pattern: Step-through pattern;Trunk flexed;Decreased stride length Gait velocity: decreased Stairs: No Wheelchair Mobility Wheelchair Mobility: No    Exercise    End of Session PT - End of Session Equipment Utilized During Treatment: Gait belt Activity Tolerance: Patient tolerated treatment well;Patient limited by fatigue Patient left: in chair;with call bell in reach;Other (comment) (with chair alarm. ) Nurse Communication: Mobility status for transfers;Mobility status for ambulation General Behavior During Session: [facility] for tasks performed Cognition: [facility] for tasks performed  Page, Meribeth Mattes 05/21/2011, 3:14 PM

## 2011-05-21 NOTE — Progress Notes (Signed)
  Echocardiogram 2D Echocardiogram has been performed.  Alicia Kent L 05/21/2011, 8:30 AM

## 2011-05-22 ENCOUNTER — Other Ambulatory Visit: Payer: Self-pay

## 2011-05-22 LAB — GLUCOSE, CAPILLARY
Glucose-Capillary: 114 mg/dL — ABNORMAL HIGH (ref 70–99)
Glucose-Capillary: 229 mg/dL — ABNORMAL HIGH (ref 70–99)
Glucose-Capillary: 361 mg/dL — ABNORMAL HIGH (ref 70–99)
Glucose-Capillary: 333 mg/dL — ABNORMAL HIGH (ref 70–99)

## 2011-05-22 MED ORDER — VITAMINS A & D EX OINT
TOPICAL_OINTMENT | CUTANEOUS | Status: AC
  Administered 2011-05-22: 5
  Filled 2011-05-22: qty 5

## 2011-05-22 NOTE — Progress Notes (Signed)
Subjective: Up in chair eyes closed. Opens eyes to verbal stimuli. Denies pain/discomfort/SOB. Reports 'i walked down hall yesterday"  Objective: Vital signs Filed Vitals:   05/21/11 1321 05/21/11 1456 05/21/11 2206 05/22/11 0610  BP: 122/71 124/72 138/81 116/88  Pulse:  66 119 109  Temp:  97.5 F (36.4 C) 97.4 F (36.3 C) 97.7 F (36.5 C)  TempSrc:  Oral Oral Oral  Resp:  18 18 19   Height:      Weight:    80 kg (176 lb 5.9 oz)  SpO2:  94% 100% 98%   Weight change: 0.4 kg (14.1 oz) Last BM Date: 05/21/11  Intake/Output from previous day: 03/19 0701 - 03/20 0700 In: 325 [P.O.:120; I.V.:205] Out: 1220 [Urine:1220]     Physical Exam: General: Alert, awake, oriented x2 , in no acute distress. Pleasant cooperative responds appropriately HEENT: No bruits, no goiter.PERRL mucus membranes mouth moist/pink Heart:  Irregular rhythm, regular rate, without murmurs, rubs, gallops. No LEE Lungs:  Normal effort. Breath sounds with rhonchi scattered bilaterally otherwise clear to auscultation bilaterally. No wheeze Abdomen: Soft, nontender, nondistended, positive bowel sounds. Extremities: No clubbing cyanosis or edema with positive pedal pulses. Neuro: Grossly intact, nonfocal. Speech clear    Lab Results: Basic Metabolic Panel:  Basename 05/21/11 0507 05/20/11 1753  NA 132* 130*  K 4.1 4.2  CL 97 94*  CO2 25 25  GLUCOSE 244* 443*  BUN 22 20  CREATININE 0.68 0.82  CALCIUM 8.9 8.5  MG -- --  PHOS -- --   Liver Function Tests:  Basename 05/20/11 0350 05/19/11 1704  AST 10 12  ALT 7 7  ALKPHOS 72 66  BILITOT 0.3 0.4  PROT 8.3 7.9  ALBUMIN 2.8* 2.8*   No results found for this basename: LIPASE:2,AMYLASE:2 in the last 72 hours No results found for this basename: AMMONIA:2 in the last 72 hours CBC:  Basename 05/20/11 0350 05/19/11 1704  WBC 22.5* 18.0*  NEUTROABS -- 14.8*  HGB 10.7* 10.9*  HCT 32.6* 32.0*  MCV 86.5 85.1  PLT 537* 490*   Cardiac  Enzymes:  Basename 05/20/11 1948 05/20/11 1201 05/20/11 0350  CKTOTAL 47 55 65  CKMB 3.1 3.5 4.0  CKMBINDEX -- -- --  TROPONINI <0.30 <0.30 <0.30   BNP:  Basename 05/20/11 0350 05/19/11 1704  PROBNP 1128.0* 1246.0*   D-Dimer: No results found for this basename: DDIMER:2 in the last 72 hours CBG:  Basename 05/22/11 0742 05/21/11 2238 05/21/11 1651 05/21/11 1219 05/21/11 0740 05/20/11 2135  GLUCAP 114* 352* 269* 266* 210* 372*   Hemoglobin A1C:  Basename 05/20/11 0350  HGBA1C 6.4*   Fasting Lipid Panel:  Basename 05/20/11 0350  CHOL 109  HDL 42  LDLCALC 53  TRIG 71  CHOLHDL 2.6  LDLDIRECT --   Thyroid Function Tests:  Basename 05/20/11 0350  TSH 0.910  T4TOTAL --  FREET4 --  T3FREE --  THYROIDAB --   Anemia Panel:  Basename 05/20/11 0350  VITAMINB12 428  FOLATE 18.7  FERRITIN 347*  TIBC 257  IRON 15*  RETICCTPCT 1.5   Coagulation:  Basename 05/20/11 0350  LABPROT 16.4*  INR 1.30   Urine Drug Screen: Drugs of Abuse  No results found for this basename: labopia, cocainscrnur, labbenz, amphetmu, thcu, labbarb    Alcohol Level: No results found for this basename: ETH:2 in the last 72 hours Urinalysis:  Basename 05/19/11 1725  COLORURINE AMBER*  LABSPEC 1.026  PHURINE 5.5  GLUCOSEU NEGATIVE  HGBUR NEGATIVE  BILIRUBINUR SMALL*  KETONESUR NEGATIVE  PROTEINUR 30*  UROBILINOGEN 1.0  NITRITE NEGATIVE  LEUKOCYTESUR NEGATIVE   Misc. Labs:  No results found for this or any previous visit (from the past 240 hour(s)).  Studies/Results: No results found.  Medications: Scheduled Meds:   . aspirin  325 mg Oral Daily  . citalopram  20 mg Oral Daily  . donepezil  10 mg Oral QHS  . enoxaparin  40 mg Subcutaneous Q24H  . feeding supplement  1 Container Oral QPC supper  . furosemide  20 mg Oral Daily  . hydrALAZINE  10 mg Oral Q8H  . insulin aspart  0-20 Units Subcutaneous TID WC  . insulin aspart  0-5 Units Subcutaneous QHS  . insulin  glargine  45 Units Subcutaneous QHS  . isosorbide mononitrate  30 mg Oral Daily  . memantine  5 mg Oral BID  . metFORMIN  500 mg Oral BID WC  . metoprolol tartrate  50 mg Oral BID  . montelukast  10 mg Oral QHS  . moxifloxacin  400 mg Oral q1800  . mulitivitamin with minerals  1 tablet Oral Daily  . pantoprazole  40 mg Oral Q0600  . predniSONE  30 mg Oral Q breakfast  . risperiDONE  0.5 mg Oral BID  . simvastatin  40 mg Oral QHS  . DISCONTD: moxifloxacin  400 mg Intravenous Q24H  . DISCONTD: moxifloxacin  400 mg Intravenous Q24H  . DISCONTD: predniSONE  40 mg Oral Q breakfast   Continuous Infusions:   . sodium chloride 10 mL/hr at 05/22/11 0700   PRN Meds:.acetaminophen, acetaminophen, fluticasone, levalbuterol, nitroGLYCERIN, nystatin, ondansetron (ZOFRAN) IV, ondansetron, oxyCODONE  Assessment/Plan:  Principal Problem:  *SOB (shortness of breath) Active Problems:  DIABETES MELLITUS, TYPE II  Leukocytosis  Dementia  Anemia  Hyponatremia SOB (shortness of breath):  The most likely cause of her shortness of breath acute bronchitis. Much improved.  Avelox day #4.  Lasix home dose resumed after holding for 2 days for hyponatremia .Echo results 60-65% with grade 2 diastolic dysfunction.   Afib: new. Tele and 12 lead yield afib. Chart review yields no documented history. Currently rate controled . Consider cardiology consult. Unclear if good coumadin candidate.  Continue BB Will monitor closely.   Hypertension. Fair control. Lasix resumed yesterday. Continue BB and hydralazine. Continue to monitor.     DIABETES MELLITUS, TYPE II  Poor control likely related to  Steroids. CBG range 114-352. Will quickly taper prednisone.  Continue lantus and metformin.  Leukocytosis:  Afebrile 2/2 to steroids.  Dementia  Stable.  Anemia  Stable.  Hyponatremia  Improving. Will continue to monitor.   Dispo: SNF when ready    LOS: 3 days   [facility] M 05/22/2011, 10:04 AM

## 2011-05-22 NOTE — Progress Notes (Signed)
Pt tele showing a-fib,  Rate controlled, [name] NP notified. Stormi Vandevelde A

## 2011-05-22 NOTE — Progress Notes (Signed)
Provided Pt and her family with bed offers.  Family considering [facility].  Family to visit facility and notify CSW of decision.  CSW to continue to follow.  Providence Crosby, LCSWA Clinical Social Work [phone]

## 2011-05-22 NOTE — Progress Notes (Signed)
Pt tele converted back to NSR HR 80's. [name] NP notified. Alicia Kent A

## 2011-05-22 NOTE — Progress Notes (Signed)
Patient seen and independently examined. Data reviewed. Transient atrial fibrillation. Currently in sinus rhythm. Monitor for recurrence but at this point would not pursue further treatment.  I agree with exam, assessment and plan.

## 2011-05-23 LAB — BASIC METABOLIC PANEL
Calcium: 8.8 mg/dL (ref 8.4–10.5)
Creatinine, Ser: 0.66 mg/dL (ref 0.50–1.10)
GFR calc Af Amer: >90 mL/min (ref >90)
GFR calc non Af Amer: 82 mL/min — ABNORMAL LOW (ref >90)
Sodium: 141 mEq/L (ref 135–145)

## 2011-05-23 LAB — GLUCOSE, CAPILLARY
Glucose-Capillary: 110 mg/dL — ABNORMAL HIGH (ref 70–99)
Glucose-Capillary: 165 mg/dL — ABNORMAL HIGH (ref 70–99)

## 2011-05-23 MED ORDER — HYDRALAZINE HCL 10 MG PO TABS
10.0000 mg | ORAL_TABLET | Freq: Three times a day (TID) | ORAL | Status: DC | PRN
  Filled 2011-05-23: qty 1

## 2011-05-23 MED ORDER — PREDNISONE 20 MG PO TABS
20.0000 mg | ORAL_TABLET | Freq: Every day | ORAL | Status: DC
  Administered 2011-05-24: 20 mg via ORAL
  Filled 2011-05-23 (×2): qty 1

## 2011-05-23 MED ORDER — NIFEDIPINE ER 30 MG PO TB24
30.0000 mg | ORAL_TABLET | Freq: Every day | ORAL | Status: DC
  Administered 2011-05-23 – 2011-05-24 (×2): 30 mg via ORAL
  Filled 2011-05-23 (×2): qty 1

## 2011-05-23 NOTE — Progress Notes (Addendum)
PROGRESS NOTE  Alicia Kent ZOX:[id number] DOB: January 30, 1931 DOA: 05/19/2011 PCP: Oliver Barre, MD, MD  Brief narrative: 78 year old woman presented to the emergency department with chest pain, shortness of breath and cough with productive sputum. Chest pain resolved with nitroglycerin. Presented with complaint of chest wall pain and shortness of breath. Noncompliant with medications including Augmentin and Lasix.  Chart review:  05/14/2011 primary care physician office visit: Urinary incontinence, possible UTI, cough, generalized weakness. Started on Augmentin and Lasix.  Past medical history: Dementia, diabetes mellitus type 2, hyperlipidemia, hypertension, GERD, depression, stroke, left bundle branch block, AAA, right carotid stenosis  Consultants:  Physical therapy: skilled nursing facility.  Procedures:  March 19: 2-D echocardiogram: Normal systolic function. Left ventricular ejection fraction 60-65%. Grade 2 diastolic dysfunction.  Antibiotics:  March 17: Avelox  Interim History: Chart reviewed in detail. Summarized as above.  Subjective: Feels okay.  Objective: Filed Vitals:   05/23/11 0203 05/23/11 0541 05/23/11 0702 05/23/11 0905  BP: 156/76 193/103 184/98 190/99  Pulse:  69  72  Temp:  97.5 F (36.4 C)    TempSrc:  Oral    Resp:  20    Height:      Weight:  80.6 kg (177 lb 11.1 oz)    SpO2:  100%      Intake/Output Summary (Last 24 hours) at 05/23/11 1202 Last data filed at 05/23/11 0700  Gross per 24 hour  Intake 683.83 ml  Output      0 ml  Net 683.83 ml  Weight without appreciable change.  Exam:   General:  Calm and comfortable in appearance.  Cardiovascular: Regular rate and rhythm. No murmur, rub, gallop. No significant lower extremity edema.  Telemetry: Sinus rhythm. Few episodes of NSVT. No recurrent atrial fibrillation.  Respiratory: Clear to auscultation bilaterally. No wheezes, rales, rhonchi. Normal respiratory effort.  Data  Reviewed: Basic Metabolic Panel:  Lab 05/23/11 8119 05/21/11 0507 05/20/11 1753 05/20/11 0350 05/19/11 1704  NA 141 132* 130* 132* 129*  K 3.8 4.1 -- -- --  CL 104 97 94* 95* 93*  CO2 31 25 25 27 25   GLUCOSE 138* 244* 443* 248* 211*  BUN 17 22 20 9 8   CREATININE 0.66 0.68 0.82 0.62 0.57  CALCIUM 8.8 8.9 8.5 8.9 8.7  MG -- -- -- -- --  PHOS -- -- -- -- --   Liver Function Tests:  Lab 05/20/11 0350 05/19/11 1704  AST 10 12  ALT 7 7  ALKPHOS 72 66  BILITOT 0.3 0.4  PROT 8.3 7.9  ALBUMIN 2.8* 2.8*   CBC:  Lab 05/20/11 0350 05/19/11 1704  WBC 22.5* 18.0*  NEUTROABS -- 14.8*  HGB 10.7* 10.9*  HCT 32.6* 32.0*  MCV 86.5 85.1  PLT 537* 490*   Cardiac Enzymes:  Lab 05/20/11 1948 05/20/11 1201 05/20/11 0350 05/19/11 2025 05/19/11 1704  CKTOTAL 47 55 65 -- 101  CKMB 3.1 3.5 4.0 -- 6.3*  CKMBINDEX -- -- -- -- --  TROPONINI <0.30 <0.30 <0.30 <0.30 <0.30   CBG:  Lab 05/23/11 1145 05/23/11 0834 05/23/11 0802 05/22/11 2034 05/22/11 1651  GLUCAP 218* 90 65* 333* 361*   Studies: Dg Chest 2 View  05/19/2011  *RADIOLOGY REPORT*  Clinical Data: Shortness of breath and chest pain  CHEST - 2 VIEW  Comparison: 05/14/2011  Findings: Heart size appears normal.  No pleural effusion or edema.  Chronic interstitial coarsening is noted bilaterally.  No superimposed airspace consolidation.  Review of the visualized osseous structures is significant  for mild spondylosis.  IMPRESSION:  1.  No acute cardiopulmonary abnormalities. 2.  Chronic interstitial changes, similar to previous exam.  Original Report Authenticated By: Rosealee Albee, M.D.   Scheduled Meds:   . aspirin  325 mg Oral Daily  . citalopram  20 mg Oral Daily  . donepezil  10 mg Oral QHS  . enoxaparin  40 mg Subcutaneous Q24H  . feeding supplement  1 Container Oral QPC supper  . furosemide  20 mg Oral Daily  . insulin aspart  0-20 Units Subcutaneous TID WC  . insulin aspart  0-5 Units Subcutaneous QHS  . insulin glargine   45 Units Subcutaneous QHS  . isosorbide mononitrate  30 mg Oral Daily  . memantine  5 mg Oral BID  . metFORMIN  500 mg Oral BID WC  . metoprolol tartrate  50 mg Oral BID  . montelukast  10 mg Oral QHS  . moxifloxacin  400 mg Oral q1800  . mulitivitamin with minerals  1 tablet Oral Daily  . NIFEdipine  30 mg Oral Daily  . pantoprazole  40 mg Oral Q0600  . predniSONE  20 mg Oral Q breakfast  . risperiDONE  0.5 mg Oral BID  . simvastatin  40 mg Oral QHS  . vitamin A & D      . DISCONTD: hydrALAZINE  10 mg Oral Q8H  . DISCONTD: predniSONE  30 mg Oral Q breakfast   Continuous Infusions:   . sodium chloride 10 mL/hr at 05/22/11 0700     Assessment/Plan: 1. Acute bronchitis: Improving. Wean steroids. Avelox total 5 days. 2. Acute diastolic congestive heart failure: Stable. Continue Lasix. Continue beta blocker.  3. Atypical chest pain: Cardiac enzymes negative. Presumably secondary to acute bronchitis. No further evaluation suggested at this point. 4. Atrial fibrillation: Transient. TSH within normal limits. 2-D echocardiogram reassuring. No further evaluation suggested this point. 5. Hypertension: Somewhat labile. Continue Lasix at 20 mg per day, metoprolol, nifedipine 30 mg by mouth daily 6. Hyponatremia: Resolved. TSH within normal limits. May be secondary to citalopram. Followup as an outpatient. 7. Diabetes mellitus type 2: One episode of hypoglycemia. Blood sugars labile. Rapid steroid taper. Continue Lantus. Discontinue Metformin secondary to heart failure. 8. Normocytic anemia: Stable. 9. Dementia: Stable. Continue Namenda and Aricept. 10. Noncompliance secondary to dementia  Stable for transfer to skilled nursing facility today.  Code Status: Full code Family Communication: None at bedside. Discussed at bedside yesterday with daughter. Disposition Plan: Skilled nursing facility placement.   Brendia Sacks, MD  [facility] Pager [phone] 05/23/2011, 12:02  PM    LOS: 4 days

## 2011-05-23 NOTE — Progress Notes (Signed)
Subjective: Sitting up in bed eating breakfast. Denies pain/dicomfort.   Objective: Vital signs Filed Vitals:   05/22/11 2204 05/23/11 0203 05/23/11 0541 05/23/11 0702  BP: 178/75 156/76 193/103 184/98  Pulse: 79  69   Temp: 98.3 F (36.8 C)  97.5 F (36.4 C)   TempSrc: Oral  Oral   Resp: 20  20   Height:      Weight:   80.6 kg (177 lb 11.1 oz)   SpO2: 100%  100%    Weight change: 0.6 kg (1 lb 5.2 oz) Last BM Date: 05/21/11  Intake/Output from previous day: 03/20 0701 - 03/21 0700 In: 923.8 [P.O.:730; I.V.:193.8] Out: -      Physical Exam: General: Alert, awake, oriented x3, in no acute distress. HEENT: No bruits, no goiter. Mucus membranes mouth moist/pink Heart: Regular rate and rhythm, without murmurs, rubs, gallops. No LEE Lungs: Normal effort. Breath sounds mild scattered rhonchi, otherwise clear to auscultation bilaterally. No wheeze Abdomen: Soft, nontender, nondistended, positive bowel sounds. Extremities: No clubbing cyanosis or edema with positive pedal pulses. Neuro: Grossly intact, nonfocal. Speech clear. Cranial nerve II-XII intact.     Lab Results: Basic Metabolic Panel:  Basename 05/23/11 0450 05/21/11 0507  NA 141 132*  K 3.8 4.1  CL 104 97  CO2 31 25  GLUCOSE 138* 244*  BUN 17 22  CREATININE 0.66 0.68  CALCIUM 8.8 8.9  MG -- --  PHOS -- --   Liver Function Tests: No results found for this basename: AST:2,ALT:2,ALKPHOS:2,BILITOT:2,PROT:2,ALBUMIN:2 in the last 72 hours No results found for this basename: LIPASE:2,AMYLASE:2 in the last 72 hours No results found for this basename: AMMONIA:2 in the last 72 hours CBC: No results found for this basename: WBC:2,NEUTROABS:2,HGB:2,HCT:2,MCV:2,PLT:2 in the last 72 hours Cardiac Enzymes:  Basename 05/20/11 1948 05/20/11 1201  CKTOTAL 47 55  CKMB 3.1 3.5  CKMBINDEX -- --  TROPONINI <0.30 <0.30   BNP: No results found for this basename: PROBNP:3 in the last 72 hours D-Dimer: No results found  for this basename: DDIMER:2 in the last 72 hours CBG:  Basename 05/23/11 0802 05/22/11 2034 05/22/11 1651 05/22/11 1147 05/22/11 0742 05/21/11 2238  GLUCAP 65* 333* 361* 229* 114* 352*   Hemoglobin A1C: No results found for this basename: HGBA1C in the last 72 hours Fasting Lipid Panel: No results found for this basename: CHOL,HDL,LDLCALC,TRIG,CHOLHDL,LDLDIRECT in the last 72 hours Thyroid Function Tests: No results found for this basename: TSH,T4TOTAL,FREET4,T3FREE,THYROIDAB in the last 72 hours Anemia Panel: No results found for this basename: VITAMINB12,FOLATE,FERRITIN,TIBC,IRON,RETICCTPCT in the last 72 hours Coagulation: No results found for this basename: LABPROT:2,INR:2 in the last 72 hours Urine Drug Screen: Drugs of Abuse  No results found for this basename: labopia, cocainscrnur, labbenz, amphetmu, thcu, labbarb    Alcohol Level: No results found for this basename: ETH:2 in the last 72 hours Urinalysis: No results found for this basename: COLORURINE:2,APPERANCEUR:2,LABSPEC:2,PHURINE:2,GLUCOSEU:2,HGBUR:2,BILIRUBINUR:2,KETONESUR:2,PROTEINUR:2,UROBILINOGEN:2,NITRITE:2,LEUKOCYTESUR:2 in the last 72 hours Misc. Labs:  No results found for this or any previous visit (from the past 240 hour(s)).  Studies/Results: No results found.  Medications: Scheduled Meds:   . aspirin  325 mg Oral Daily  . citalopram  20 mg Oral Daily  . donepezil  10 mg Oral QHS  . enoxaparin  40 mg Subcutaneous Q24H  . feeding supplement  1 Container Oral QPC supper  . furosemide  20 mg Oral Daily  . insulin aspart  0-20 Units Subcutaneous TID WC  . insulin aspart  0-5 Units Subcutaneous QHS  . insulin glargine  45 Units Subcutaneous QHS  . isosorbide mononitrate  30 mg Oral Daily  . memantine  5 mg Oral BID  . metFORMIN  500 mg Oral BID WC  . metoprolol tartrate  50 mg Oral BID  . montelukast  10 mg Oral QHS  . moxifloxacin  400 mg Oral q1800  . mulitivitamin with minerals  1 tablet Oral  Daily  . NIFEdipine  30 mg Oral Daily  . pantoprazole  40 mg Oral Q0600  . predniSONE  30 mg Oral Q breakfast  . risperiDONE  0.5 mg Oral BID  . simvastatin  40 mg Oral QHS  . vitamin A & D      . DISCONTD: hydrALAZINE  10 mg Oral Q8H   Continuous Infusions:   . sodium chloride 10 mL/hr at 05/22/11 0700   PRN Meds:.acetaminophen, acetaminophen, fluticasone, hydrALAZINE, levalbuterol, nitroGLYCERIN, nystatin, ondansetron (ZOFRAN) IV, ondansetron, oxyCODONE  Assessment/Plan:  Principal Problem:  *SOB (shortness of breath) Active Problems:  DIABETES MELLITUS, TYPE II  Leukocytosis  Dementia  Anemia  Hyponatremia SOB (shortness of breath):  The most likely cause of her shortness of breath acute bronchitis. Much improved. Avelox day #5. Lasix home dose resumed after holding for 2 days for hyponatremia .Echo results 60-65% with grade 2 diastolic dysfunction. Sats 100 on 1.5L  Afib: new. Transient. Chart review yields no documented history. Currently SR. SR since yesterday afternoon.    Hypertension. Poor control. Lasix resumed 3/19. Continue BB. Will resume procardia and have hydralazine prn. Continue to monitor.  DIABETES MELLITUS, TYPE II  Poor control likely related to Steroids. CBG range 65-.333 Will quickly taper prednisone. Continue lantus and metformin.  Leukocytosis:  Afebrile 2/2 to steroids.  Dementia  Stable.  Anemia  Stable.  Hyponatremia  Resolved.  Dispo: to facility when bed available.        LOS: 4 days   [facility] M 05/23/2011, 8:21 AM

## 2011-05-23 NOTE — Progress Notes (Signed)
Nutrition Follow-up  Per RN, pt to discharge to SNF today. RN states pt drinks >50% of resource breeze drinks daily. Documented PO intake 75-100% of most meals. Pt increasing ambulation with PT.  Diet Order:  Carb Modified Medium (1600-2000kcal)  Meds: Scheduled Meds:   . aspirin  325 mg Oral Daily  . citalopram  20 mg Oral Daily  . donepezil  10 mg Oral QHS  . enoxaparin  40 mg Subcutaneous Q24H  . feeding supplement  1 Container Oral QPC supper  . furosemide  20 mg Oral Daily  . insulin aspart  0-20 Units Subcutaneous TID WC  . insulin aspart  0-5 Units Subcutaneous QHS  . insulin glargine  45 Units Subcutaneous QHS  . isosorbide mononitrate  30 mg Oral Daily  . memantine  5 mg Oral BID  . metoprolol tartrate  50 mg Oral BID  . montelukast  10 mg Oral QHS  . moxifloxacin  400 mg Oral q1800  . mulitivitamin with minerals  1 tablet Oral Daily  . NIFEdipine  30 mg Oral Daily  . pantoprazole  40 mg Oral Q0600  . predniSONE  20 mg Oral Q breakfast  . risperiDONE  0.5 mg Oral BID  . simvastatin  40 mg Oral QHS  . vitamin A & D      . DISCONTD: hydrALAZINE  10 mg Oral Q8H  . DISCONTD: metFORMIN  500 mg Oral BID WC  . DISCONTD: predniSONE  30 mg Oral Q breakfast   Continuous Infusions:   . sodium chloride 10 mL/hr at 05/22/11 0700   PRN Meds:.acetaminophen, acetaminophen, fluticasone, hydrALAZINE, levalbuterol, nitroGLYCERIN, nystatin, ondansetron (ZOFRAN) IV, ondansetron, oxyCODONE  Labs:  CMP     Component Value Date/Time   NA 141 05/23/2011 0450   K 3.8 05/23/2011 0450   CL 104 05/23/2011 0450   CO2 31 05/23/2011 0450   GLUCOSE 138* 05/23/2011 0450   BUN 17 05/23/2011 0450   CREATININE 0.66 05/23/2011 0450   CALCIUM 8.8 05/23/2011 0450   CALCIUM 9.0 09/18/2009 2257   PROT 8.3 05/20/2011 0350   ALBUMIN 2.8* 05/20/2011 0350   AST 10 05/20/2011 0350   ALT 7 05/20/2011 0350   ALKPHOS 72 05/20/2011 0350   BILITOT 0.3 05/20/2011 0350   GFRNONAA 82* 05/23/2011 0450   GFRAA >90  05/23/2011 0450   CBG (last 3)   Basename 05/23/11 1145 05/23/11 0834 05/23/11 0802  GLUCAP 218* 90 65*     Intake/Output Summary (Last 24 hours) at 05/23/11 1452 Last data filed at 05/23/11 1300  Gross per 24 hour  Intake    885 ml  Output      0 ml  Net    885 ml  Last BM: 05/23/11  Weight Status:  80.6 kg (trending up from 78.1 kg on 3/18)  Estimated needs:  1800-1950 kcal and 67-80 grams protein  Nutrition Dx:  Inadequate oral intake, ongoing.  Goal:    Food/Beverage: pt to consume >50% of meals consistently.  Met. New Goal: Food/Beverage: PO intake to meet >/=90% of estimated needs.  Intervention:   1. Supplement: continue [employer] once daily.  Monitor:  PO intake, weight trend  Karenann Cai Pager #:  [phone] Hoyt Koch

## 2011-05-23 NOTE — Progress Notes (Signed)
CBG: 65  Treatment: 15 GM carbohydrate snack  Symptoms: None  Follow-up CBG: [id number] CBG Result:90  Possible Reasons for Event: Unknown  Comments/MD notified:Scottie Stanish-NP    Orlando Penner

## 2011-05-23 NOTE — Progress Notes (Signed)
Physical Therapy Treatment Patient Details Name: Alicia Kent MRN: [id number] DOB: March 25, 1930 Today's Date: 05/23/2011  PT Assessment/Plan  PT - Assessment/Plan Comments on Treatment Session: Pt able to increase ambulation distance however fatigues quickly.  Vitals pre-gait: 164/74 mmHg, 70 bpm, 100% room air and post-gait 165/36mmHg, 69 bpm, 97% room air.  RN made aware of SaO2 and Lambert not replaced. PT Plan: Discharge plan remains appropriate Follow Up Recommendations: Skilled nursing facility Equipment Recommended: Defer to next venue PT Goals  Acute Rehab PT Goals PT Goal: Supine/Side to Sit - Progress: Progressing toward goal PT Goal: Sit to Stand - Progress: Progressing toward goal PT Goal: Stand to Sit - Progress: Progressing toward goal PT Goal: Ambulate - Progress: Progressing toward goal  PT Treatment Precautions/Restrictions  Precautions Precautions: Fall Required Braces or Orthoses: No Restrictions Weight Bearing Restrictions: No Mobility (including Balance) Bed Mobility Bed Mobility: Yes Supine to Sit: 4: Min assist Supine to Sit Details (indicate cue type and reason): assist for trunk, verbal cues for technique Transfers Transfers: Yes Sit to Stand: 4: Min assist;With upper extremity assist;From bed Sit to Stand Details (indicate cue type and reason): verbal cues for hand placement and forward lean Stand to Sit: 4: Min assist;With upper extremity assist;To chair/3-in-1 Stand to Sit Details: verbal cue for armrests Ambulation/Gait Ambulation/Gait: Yes Ambulation/Gait Assistance: 4: Min assist Ambulation/Gait Assistance Details (indicate cue type and reason): min/guard, verbal cues for safe use of RW, pt with occasional knees buckling near end of ambulation 2* fatigue Ambulation Distance (Feet): 260 Feet Assistive device: Rolling walker Gait Pattern: Step-through pattern;Decreased stride length Gait velocity: decreased    Exercise  General Exercises - Lower  Extremity Ankle Circles/Pumps: AROM;Both;10 reps;Seated Long Arc Quad: AROM;Strengthening;Both;10 reps;Seated Hip Flexion/Marching: AROM;Strengthening;Both;10 reps;Seated End of Session PT - End of Session Activity Tolerance: Patient limited by fatigue Patient left: in chair;with call bell in reach;with family/visitor present General Behavior During Session: [facility] for tasks performed Cognition: [facility] for tasks performed  Jim Philemon,KATHrine E 05/23/2011, 12:02 PM Pager: [phone]

## 2011-05-23 NOTE — Progress Notes (Signed)
Pt BP elevated this am, paged on call MD and gave morning dose of hydralazine. BP continues to be elevated paged MD again. Will continue to monitor pt.

## 2011-05-23 NOTE — Progress Notes (Signed)
   CARE MANAGEMENT NOTE 05/23/2011  Patient:  [facility]   Account Number:  [id number]  Date Initiated:  05/20/2011  Documentation initiated by:  [facility]  Subjective/Objective Assessment:   ADMITTED W/SOB.     Action/Plan:   FROM HOME W/DTR.   Anticipated DC Date:  05/24/2011   Anticipated DC Plan:  SKILLED NURSING FACILITY  In-house referral  Clinical Social Worker         Choice offered to / List presented to:             Status of service:  In process, will continue to follow Medicare Important Message given?   (If response is "NO", the following Medicare IM given date fields will be blank) Date Medicare IM given:   Date Additional Medicare IM given:    Discharge Disposition:    Per UR Regulation:  Reviewed for med. necessity/level of care/duration of stay  If discussed at Long Length of Stay Meetings, dates discussed:    Comments:  05/23/11 [facility] RN,BSN NCM [phone] CSW-SNF.  05/20/11 Valencia Kassa RN,BSN NCM [phone]

## 2011-05-23 NOTE — Progress Notes (Signed)
Patient is cleared for discharge. CSW spoke with patients daughter, requesting [facility]. Notified [facility] of same. They are going to try and get [place].  Chandria Rookstool C. Mardy Hoppe MSW, LCSW [phone]

## 2011-05-24 LAB — GLUCOSE, CAPILLARY
Glucose-Capillary: 65 mg/dL — ABNORMAL LOW (ref 70–99)
Glucose-Capillary: 114 mg/dL — ABNORMAL HIGH (ref 70–99)

## 2011-05-24 MED ORDER — HYDROCODONE-ACETAMINOPHEN 5-500 MG PO TABS: 1.0000 | ORAL_TABLET | Freq: Four times a day (QID) | ORAL | Status: DC | PRN

## 2011-05-24 MED ORDER — INSULIN GLARGINE 100 UNIT/ML ~~LOC~~ SOLN: 40.0000 [IU] | Freq: Every day | SUBCUTANEOUS | Status: DC

## 2011-05-24 MED ORDER — BOOST / RESOURCE BREEZE PO LIQD: 1.0000 | Freq: Every day | ORAL | Status: DC

## 2011-05-24 MED ORDER — LABETALOL HCL 100 MG PO TABS: 100.0000 mg | ORAL_TABLET | Freq: Two times a day (BID) | ORAL | Status: DC

## 2011-05-24 MED ORDER — PREDNISONE 5 MG PO TABS: 5.0000 mg | ORAL_TABLET | Freq: Every day | ORAL | Status: AC

## 2011-05-24 NOTE — Discharge Summary (Signed)
Physician Discharge Summary  Patient ID: Alicia Kent MRN: [id number] DOB/AGE: 1930-08-02 78 y.o.  Admit date: 05/19/2011 Discharge date: 05/24/2011  Primary Care Physician:  Oliver Barre, MD, MD   Discharge Diagnoses:    Principal Problem:  *SOB (shortness of breath) Active Problems:  DIABETES MELLITUS, TYPE II  Leukocytosis  Dementia  Anemia  Hyponatremia   Medication List  As of 05/24/2011  2:45 PM   STOP taking these medications         cephALEXin 500 MG capsule      metFORMIN 500 MG 24 hr tablet         TAKE these medications         aspirin 81 MG EC tablet   Take 81 mg by mouth daily.      BD ULTRA-FINE PEN NEEDLES 29G X 12.7MM Misc   Generic drug: Insulin Pen Needle   USE AS DIRECTED      citalopram 20 MG tablet   Commonly known as: CELEXA   Take 20 mg by mouth daily.      cyclobenzaprine 5 MG tablet   Commonly known as: FLEXERIL   Take 5 mg by mouth 3 (three) times daily as needed. For muscle spasms.      donepezil 10 MG tablet   Commonly known as: ARICEPT   TAKE ONE TABLET BY MOUTH EVERY DAY      famotidine 40 MG tablet   Commonly known as: PEPCID   Take 40 mg by mouth at bedtime.      feeding supplement Liqd   Take 1 Container by mouth daily after supper.      fluticasone 50 MCG/ACT nasal spray   Commonly known as: FLONASE   Place 2 sprays into the nose daily as needed. For allergies.      furosemide 20 MG tablet   Commonly known as: LASIX   Take 1 tablet (20 mg total) by mouth daily.      HYDROcodone-acetaminophen 5-500 MG per tablet   Commonly known as: VICODIN   Take 1 tablet by mouth every 6 (six) hours as needed. For pain.      insulin glargine 100 UNIT/ML injection   Commonly known as: LANTUS   Inject 45 Units into the skin at bedtime.      labetalol 100 MG tablet   Commonly known as: NORMODYNE   Take 1 tablet (100 mg total) by mouth 2 (two) times daily.      memantine 5 MG tablet   Commonly known as: NAMENDA   Take 5 mg by  mouth 2 (two) times daily.      montelukast 10 MG tablet   Commonly known as: SINGULAIR   Take 10 mg by mouth at bedtime.      mulitivitamin with minerals Tabs   Take 1 tablet by mouth daily.      NIFEDICAL XL 30 MG 24 hr tablet   Generic drug: NIFEdipine   TAKE ONE TABLET BY MOUTH DAILY      nystatin powder   Commonly known as: MYCOSTATIN   Apply 100,000 g topically 2 (two) times daily as needed. For yeast infection between breasts.      predniSONE 5 MG tablet   Commonly known as: DELTASONE   Take 1 tablet (5 mg total) by mouth daily. Take 3 tabs 3/23, take 2 tabs 3/24 take 1 tab 3/25 then stop.      risperiDONE 0.5 MG tablet   Commonly known as: RISPERDAL   Take 0.5 mg  by mouth 2 (two) times daily.      simvastatin 40 MG tablet   Commonly known as: ZOCOR   Take 40 mg by mouth at bedtime.             Disposition and Follow-up: Pt medically stable and ready for discharge to facility. Will need follow up 1 week evaluate BP and CBG's.  Consults:  Physical Therapy: rec skilled nursing  Physical exam: General: Alert, awake, oriented x3, in no acute distress.  HEENT: No bruits, no goiter. Mucus membranes mouth moist/pink  Heart: Regular rate and rhythm, without murmurs, rubs, gallops. No LEE  Lungs: Normal effort. Breath sounds mild scattered rhonchi, otherwise clear to auscultation bilaterally. No wheeze  Abdomen: Soft, nontender, nondistended, positive bowel sounds.  Extremities: No clubbing cyanosis or edema with positive pedal pulses.  Neuro: Grossly intact, nonfocal. Speech clear. Cranial nerve II-XII intact.     Significant Diagnostic Studies:  Dg Chest 2 View  05/19/2011  *RADIOLOGY REPORT*  Clinical Data: Shortness of breath and chest pain  CHEST - 2 VIEW  Comparison: 05/14/2011  Findings: Heart size appears normal.  No pleural effusion or edema.  Chronic interstitial coarsening is noted bilaterally.  No superimposed airspace consolidation.  Review of the  visualized osseous structures is significant for mild spondylosis.  IMPRESSION:  1.  No acute cardiopulmonary abnormalities. 2.  Chronic interstitial changes, similar to previous exam.  Original Report Authenticated By: Rosealee Albee, M.D.    Labs Reviewed  GLUCOSE, CAPILLARY - Abnormal; Notable for the following:    Glucose-Capillary 226 (*)    All other components within normal limits  CBC - Abnormal; Notable for the following:    WBC 18.0 (*)    RBC 3.76 (*)    Hemoglobin 10.9 (*)    HCT 32.0 (*)    Platelets 490 (*)    All other components within normal limits  DIFFERENTIAL - Abnormal; Notable for the following:    Neutrophils Relative 83 (*)    Neutro Abs 14.8 (*)    Lymphocytes Relative 8 (*)    Monocytes Absolute 1.6 (*)    All other components within normal limits  COMPREHENSIVE METABOLIC PANEL - Abnormal; Notable for the following:    Sodium 129 (*)    Chloride 93 (*)    Glucose, Bld 211 (*)    Albumin 2.8 (*)    GFR calc non Af Amer 87 (*)    All other components within normal limits  URINALYSIS, ROUTINE W REFLEX MICROSCOPIC - Abnormal; Notable for the following:    Color, Urine AMBER (*) BIOCHEMICALS MAY BE AFFECTED BY COLOR   APPearance CLOUDY (*)    Bilirubin Urine SMALL (*)    Protein, ur 30 (*)    All other components within normal limits  CARDIAC PANEL(CRET KIN+CKTOT+MB+TROPI) - Abnormal; Notable for the following:    CK, MB 6.3 (*)    Relative Index 6.2 (*)    All other components within normal limits  PRO B NATRIURETIC PEPTIDE - Abnormal; Notable for the following:    Pro B Natriuretic peptide (BNP) 1246.0 (*)    All other components within normal limits  IRON AND TIBC - Abnormal; Notable for the following:    Iron 15 (*)    Saturation Ratios 6 (*)    All other components within normal limits  FERRITIN - Abnormal; Notable for the following:    Ferritin 347 (*)    All other components within normal limits  RETICULOCYTES - Abnormal;  Notable for the  following:    RBC. 3.77 (*)    All other components within normal limits  GLUCOSE, CAPILLARY - Abnormal; Notable for the following:    Glucose-Capillary 228 (*)    All other components within normal limits  COMPREHENSIVE METABOLIC PANEL - Abnormal; Notable for the following:    Sodium 132 (*)    Chloride 95 (*)    Glucose, Bld 248 (*)    Albumin 2.8 (*)    GFR calc non Af Amer 84 (*)    All other components within normal limits  CBC - Abnormal; Notable for the following:    WBC 22.5 (*) REPEATED TO VERIFY   RBC 3.77 (*)    Hemoglobin 10.7 (*)    HCT 32.6 (*)    Platelets 537 (*)    All other components within normal limits  PROTIME-INR - Abnormal; Notable for the following:    Prothrombin Time 16.4 (*)    All other components within normal limits  APTT - Abnormal; Notable for the following:    aPTT 40 (*)    All other components within normal limits  PRO B NATRIURETIC PEPTIDE - Abnormal; Notable for the following:    Pro B Natriuretic peptide (BNP) 1128.0 (*)    All other components within normal limits  HEMOGLOBIN A1C - Abnormal; Notable for the following:    Hemoglobin A1C 6.4 (*)    Mean Plasma Glucose 137 (*)    All other components within normal limits  GLUCOSE, CAPILLARY - Abnormal; Notable for the following:    Glucose-Capillary 202 (*)    All other components within normal limits  GLUCOSE, CAPILLARY - Abnormal; Notable for the following:    Glucose-Capillary 287 (*)    All other components within normal limits  GLUCOSE, CAPILLARY - Abnormal; Notable for the following:    Glucose-Capillary 446 (*)    All other components within normal limits  BASIC METABOLIC PANEL - Abnormal; Notable for the following:    Sodium 130 (*)    Chloride 94 (*)    Glucose, Bld 443 (*)    GFR calc non Af Amer 67 (*)    GFR calc Af Amer 77 (*)    All other components within normal limits  BASIC METABOLIC PANEL - Abnormal; Notable for the following:    Sodium 132 (*)    Glucose, Bld  244 (*)    GFR calc non Af Amer 82 (*)    All other components within normal limits  GLUCOSE, CAPILLARY - Abnormal; Notable for the following:    Glucose-Capillary 447 (*)    All other components within normal limits  GLUCOSE, CAPILLARY - Abnormal; Notable for the following:    Glucose-Capillary 433 (*)    All other components within normal limits  GLUCOSE, CAPILLARY - Abnormal; Notable for the following:    Glucose-Capillary 372 (*)    All other components within normal limits  GLUCOSE, CAPILLARY - Abnormal; Notable for the following:    Glucose-Capillary 210 (*)    All other components within normal limits  GLUCOSE, CAPILLARY - Abnormal; Notable for the following:    Glucose-Capillary 266 (*)    All other components within normal limits  GLUCOSE, CAPILLARY - Abnormal; Notable for the following:    Glucose-Capillary 269 (*)    All other components within normal limits  GLUCOSE, CAPILLARY - Abnormal; Notable for the following:    Glucose-Capillary 352 (*)    All other components within normal limits  GLUCOSE, CAPILLARY -  Abnormal; Notable for the following:    Glucose-Capillary 114 (*)    All other components within normal limits  GLUCOSE, CAPILLARY - Abnormal; Notable for the following:    Glucose-Capillary 229 (*)    All other components within normal limits  BASIC METABOLIC PANEL - Abnormal; Notable for the following:    Glucose, Bld 138 (*)    GFR calc non Af Amer 82 (*)    All other components within normal limits  GLUCOSE, CAPILLARY - Abnormal; Notable for the following:    Glucose-Capillary 361 (*)    All other components within normal limits  GLUCOSE, CAPILLARY - Abnormal; Notable for the following:    Glucose-Capillary 333 (*)    All other components within normal limits  GLUCOSE, CAPILLARY - Abnormal; Notable for the following:    Glucose-Capillary 65 (*)    All other components within normal limits  GLUCOSE, CAPILLARY - Abnormal; Notable for the following:     Glucose-Capillary 218 (*)    All other components within normal limits  GLUCOSE, CAPILLARY - Abnormal; Notable for the following:    Glucose-Capillary 110 (*)    All other components within normal limits  GLUCOSE, CAPILLARY - Abnormal; Notable for the following:    Glucose-Capillary 165 (*)    All other components within normal limits  GLUCOSE, CAPILLARY - Abnormal; Notable for the following:    Glucose-Capillary 65 (*)    All other components within normal limits  GLUCOSE, CAPILLARY - Abnormal; Notable for the following:    Glucose-Capillary 114 (*)    All other components within normal limits  URINE MICROSCOPIC-ADD ON  TROPONIN I  LIPID PANEL  PROCALCITONIN  VITAMIN B12  FOLATE  OSMOLALITY  OSMOLALITY, URINE  TSH  CARDIAC PANEL(CRET KIN+CKTOT+MB+TROPI)  CARDIAC PANEL(CRET KIN+CKTOT+MB+TROPI)  CARDIAC PANEL(CRET KIN+CKTOT+MB+TROPI)  GLUCOSE, CAPILLARY        Dg Chest 2 View  05/19/2011  *RADIOLOGY REPORT*  Clinical Data: Shortness of breath and chest pain  CHEST - 2 VIEW  Comparison: 05/14/2011  Findings: Heart size appears normal.  No pleural effusion or edema.  Chronic interstitial coarsening is noted bilaterally.  No superimposed airspace consolidation.  Review of the visualized osseous structures is significant for mild spondylosis.  IMPRESSION:  1.  No acute cardiopulmonary abnormalities. 2.  Chronic interstitial changes, similar to previous exam.  Original Report Authenticated By: Rosealee Albee, M.D.   Dg Chest 2 View  05/14/2011  *RADIOLOGY REPORT*  Clinical Data: Cough, shortness of breath  CHEST - 2 VIEW  Comparison: 03/16/2011  Findings: Stable cardiomegaly.  Mild thoracic dextroscoliosis. There is mild perihilar and bibasilar interstitial edema or infiltrates, new since previous exam.  There are new small pleural effusions.  Minimal spurring in the mid thoracic spine.  Tortuous atheromatous thoracic aorta.  IMPRESSION:  1.  Cardiomegaly with new perihilar and  bibasilar interstitial edema and small effusions suggesting CHF.  Original Report Authenticated By: Osa Craver, M.D.       Brief H and P: For complete details please refer to admission H and P, but in brief   78 year old lady with no prior h/o CHF, brought in by her daughter on 3/17/13for worsening shortness of breath several day duration.Associated with Cough with productive sputum. On arrival to ER she complained of substernal chest pain which has resolved with nitroglycerin. She was recently seen at her PCP office for similar complaints and was given a prescription for lasix and Augmentin. The Patient has moderate dementia and most  of the history was obtained from the daughter at bedside who lives with her. As per the daughter, the patient hasn't taken any the medication prescribed by her PCP. The daughter also reports decreased po intake over the last 4 weeks, increased sleepiness. No fevers. Loose bowel movements last week, which have resolved. Pt had a nuclear stress test in 2011 and was told it was within normal limits. Pt also complains of pain along her ribs on the sides. She denies any abd pain , nausea or vomiting. She was admitted for further eval and treatment.    Hospital Course:   Principal Problem:  *SOB (shortness of breath) Active Problems:  DIABETES MELLITUS, TYPE II  Leukocytosis  Dementia  Anemia  Hyponatremia SOB (shortness of breath): Pt admitted to [facility]. Chest xray as above. Pro bnp 1246 with unknown baseline. The most likely cause of her shortness of breath acute bronchitis. She received 5 days  Avelox. Remained afebrile. Has leukocytosis but likely related to prednisone started on admission. Will wean prednisone quickly as above. At time of discharge SOB resolved. Sats 95-98% on ra.   Acute diastolic congestive HF.  Lasix home dose resumed after holding for 2 days for hyponatremia .Echo results 60-65% with grade 2 diastolic dysfunction. Stable. Will continue  lasix at discharge. Initially metoprolol given instead of home labetolol. Will discharge on pre-admission labetalol but with 1/2 dose. BP to be monitored daily by facility and follow up with PCP 1week to evaluate trends and adjust as needed.    Afib: new. Transient. Chart review yields no documented history. Currently SR. TSH o.90.  2decho as above.  Hypertension. Labile. Lasix resumed 3/19. Continue BB pre-admission at 1/2 dose. Will contine procardia.  Atypical chest pain: CE negative. Presumably secondary to acute bronchitis. No further evaluation suggested. Marland Kitchen  DIABETES MELLITUS, TYPE II  Poor control likely related to Steroids. HGA1c 6.3. Anticipate improvement once prednisone taper complete. Will Discharge on preadmission lantus. Will discontinue metformin. Follow up with PCP 1 week evaluate CBG's and adjust as needed. Leukocytosis:  Afebrile 2/2 to steroids.  Dementia  Stable.  Anemia  Stable.  Hyponatremia  Resolved.      Time spent on Discharge: 40 min  Signed: Gwenyth Bender 05/24/2011, 2:45 PM

## 2011-05-24 NOTE — Discharge Summary (Addendum)
Patient seen and examined. Data reviewed. Agree with exam and discharge today.  1. Acute bronchitis: Improving. Wean steroids. Avelox total 5 days. 2. Acute diastolic congestive heart failure: Stable. Continue Lasix. Continue beta blocker.  3. Atypical chest pain: Cardiac enzymes negative. Presumably secondary to acute bronchitis. No further evaluation suggested at this point. 4. Atrial fibrillation: Transient. TSH within normal limits. 2-D echocardiogram reassuring. No further evaluation suggested this point. 5. Hypertension: Somewhat labile. Continue Lasix at 20 mg per day, metoprolol, nifedipine 30 mg by mouth daily 6. Hyponatremia: Resolved. TSH within normal limits. May be secondary to citalopram. Followup as an outpatient. 7. Diabetes mellitus type 2: One episode of hypoglycemia. Blood sugars labile. Rapid steroid taper. Continue Lantus. Discontinue Metformin secondary to heart failure. 8. Normocytic anemia: Stable. 9. Dementia: Stable. Continue Namenda and Aricept. 10. Noncompliance secondary to dementia

## 2011-05-24 NOTE — Progress Notes (Signed)
Patient accepted at [facility]. [employer] auth obtained. Daughter to go to [facility] to complete paperwork at 1pm.  [facility] Sant [facility]. Majed Pellegrin MSW, LCSW [phone]

## 2011-05-29 ENCOUNTER — Ambulatory Visit: Payer: Medicare PPO | Admitting: Internal Medicine

## 2011-06-04 ENCOUNTER — Emergency Department (HOSPITAL_COMMUNITY): Payer: Medicare PPO

## 2011-06-04 ENCOUNTER — Other Ambulatory Visit (HOSPITAL_COMMUNITY): Payer: Medicare PPO

## 2011-06-04 ENCOUNTER — Encounter (HOSPITAL_COMMUNITY): Payer: Self-pay | Admitting: Emergency Medicine

## 2011-06-04 ENCOUNTER — Emergency Department (HOSPITAL_COMMUNITY)
Admission: EM | Admit: 2011-06-04 | Discharge: 2011-06-04 | Disposition: A | Discharge status: Home / Self Care | Payer: Medicare PPO | Attending: Emergency Medicine | Admitting: Emergency Medicine

## 2011-06-04 DIAGNOSIS — W19XXXA Unspecified fall, initial encounter: Secondary | ICD-10-CM | POA: Insufficient documentation

## 2011-06-04 DIAGNOSIS — R296 Repeated falls: Secondary | ICD-10-CM

## 2011-06-04 DIAGNOSIS — M545 Low back pain, unspecified: Secondary | ICD-10-CM | POA: Insufficient documentation

## 2011-06-04 DIAGNOSIS — Z9181 History of falling: Secondary | ICD-10-CM | POA: Insufficient documentation

## 2011-06-04 DIAGNOSIS — F3289 Other specified depressive episodes: Secondary | ICD-10-CM | POA: Insufficient documentation

## 2011-06-04 DIAGNOSIS — IMO0002 Reserved for concepts with insufficient information to code with codable children: Secondary | ICD-10-CM | POA: Insufficient documentation

## 2011-06-04 DIAGNOSIS — K219 Gastro-esophageal reflux disease without esophagitis: Secondary | ICD-10-CM | POA: Insufficient documentation

## 2011-06-04 DIAGNOSIS — Z794 Long term (current) use of insulin: Secondary | ICD-10-CM | POA: Insufficient documentation

## 2011-06-04 DIAGNOSIS — E119 Type 2 diabetes mellitus without complications: Secondary | ICD-10-CM | POA: Insufficient documentation

## 2011-06-04 DIAGNOSIS — F0390 Unspecified dementia without behavioral disturbance: Secondary | ICD-10-CM | POA: Insufficient documentation

## 2011-06-04 DIAGNOSIS — Z8673 Personal history of transient ischemic attack (TIA), and cerebral infarction without residual deficits: Secondary | ICD-10-CM | POA: Insufficient documentation

## 2011-06-04 DIAGNOSIS — I10 Essential (primary) hypertension: Secondary | ICD-10-CM | POA: Insufficient documentation

## 2011-06-04 DIAGNOSIS — J45909 Unspecified asthma, uncomplicated: Secondary | ICD-10-CM | POA: Insufficient documentation

## 2011-06-04 DIAGNOSIS — E785 Hyperlipidemia, unspecified: Secondary | ICD-10-CM | POA: Insufficient documentation

## 2011-06-04 DIAGNOSIS — F329 Major depressive disorder, single episode, unspecified: Secondary | ICD-10-CM | POA: Insufficient documentation

## 2011-06-04 DIAGNOSIS — M25559 Pain in unspecified hip: Secondary | ICD-10-CM | POA: Insufficient documentation

## 2011-06-04 DIAGNOSIS — Z79899 Other long term (current) drug therapy: Secondary | ICD-10-CM | POA: Insufficient documentation

## 2011-06-04 DIAGNOSIS — Z7982 Long term (current) use of aspirin: Secondary | ICD-10-CM | POA: Insufficient documentation

## 2011-06-04 LAB — URINALYSIS, ROUTINE W REFLEX MICROSCOPIC
Bilirubin Urine: NEGATIVE
Glucose, UA: 250 mg/dL — AB
Hgb urine dipstick: NEGATIVE
Specific Gravity, Urine: 1.018 (ref 1.005–1.030)
Urobilinogen, UA: 0.2 mg/dL (ref 0.0–1.0)

## 2011-06-04 LAB — CBC
HCT: 38.8 % (ref 36.0–46.0)
MCHC: 32.2 g/dL (ref 30.0–36.0)
MCV: 88.6 fL (ref 78.0–100.0)
RDW: 15.3 % (ref 11.5–15.5)
WBC: 10.4 10*3/uL (ref 4.0–10.5)

## 2011-06-04 LAB — POCT I-STAT, CHEM 8
BUN: 7 mg/dL (ref 6–23)
Calcium, Ion: 1.17 mmol/L (ref 1.12–1.32)
Chloride: 104 mEq/L (ref 96–112)
Creatinine, Ser: 0.7 mg/dL (ref 0.50–1.10)
Glucose, Bld: 64 mg/dL — ABNORMAL LOW (ref 70–99)
HCT: 39 % (ref 36.0–46.0)
Hemoglobin: 13.3 g/dL (ref 12.0–15.0)
Potassium: 4 mEq/L (ref 3.5–5.1)
Sodium: 141 mEq/L (ref 135–145)
TCO2: 27 mmol/L (ref 0–100)

## 2011-06-04 MED ORDER — DEXTROSE 50 % IV SOLN
INTRAVENOUS | Status: AC
  Filled 2011-06-04: qty 50

## 2011-06-04 NOTE — ED Notes (Signed)
ZOX:[id number]<AV> Expected date:06/04/11<BR> Expected time: 1:09 AM<BR> Means of arrival:Ambulance<BR> Comments:<BR> M211. 78 yo f from from. Hip pain recent falls and illness URI. 15 mins

## 2011-06-04 NOTE — ED Provider Notes (Signed)
History     CSN: [id number]  Arrival date & time 06/04/11  0120   First MD Initiated Contact with Patient 06/04/11 [phone]      Chief Complaint  Patient presents with  . Fall  . Hip Pain    (Consider location/radiation/quality/duration/timing/severity/associated sxs/prior treatment) HPI Comments: Patient has had frequent falls in the past 3 days landing on her back or on hips.  She's also been sleeping very little worse at night appears to be "sundowning" she was recently hospitalized for the same sent to a rehabilitation facility for one week.  She was home for one day before she started falling again.  Report by EMS tonight to to pain in her bilateral hips and lower back.  Patient is a 78 y.o. female presenting with fall and hip pain. The history is provided by a caregiver.  Fall The accident occurred 1 to 2 hours ago. The fall occurred while walking. She fell from a height of 3 to 5 ft. She landed on a hard floor. The point of impact was the left hip and right hip. Pertinent negatives include no fever and no vomiting.  Hip Pain Pertinent negatives include no chills, congestion, coughing, fever, vomiting or weakness.    Past Medical History  Diagnosis Date  . Diabetes mellitus type II   . Hyperlipidemia   . HTN (hypertension)   . GERD (gastroesophageal reflux disease)   . Allergic rhinitis   . Depression   . Dementia     mild  . History of CVA (cerebrovascular accident)   . LBBB (left bundle branch block)   . AAA (abdominal aortic aneurysm)     small, last check in 05/2009, due for repeat in 05/2010  . Carotid stenosis 09/2009    40-59% RICA, 0-39% LICA  . LVH (left ventricular hypertrophy) 10/2009    Moderate, EF 55-60%, no regional wall motion abnormalities  . Shellfish allergy     anaphylaxis  . Osteopenia   . Vitamin d deficiency   . Fracture of rib of left side 12/2009  . Asthma 01/27/2011    Past Surgical History  Procedure Date  . Ankle surgery     Right, by  Ortho in [place]  . Tracheostomy 2010    anaphylactic episode on vent  . Tubal ligation     Family History  Problem Relation Age of Onset  . Rheum arthritis Sister   . Stroke Brother   . Hypertension Brother   . Diabetes type II Other     2 siblings    History  Substance Use Topics  . Smoking status: Current Everyday Smoker  . Smokeless tobacco: Not on file   Comment: 3/4 ppd  . Alcohol Use: No    OB History    Grav Para Term Preterm Abortions TAB SAB Ect Mult Living                  Review of Systems  Constitutional: Negative for fever and chills.  HENT: Negative for congestion and rhinorrhea.   Eyes: Negative for visual disturbance.  Respiratory: Negative for cough.   Cardiovascular: Negative for leg swelling.  Gastrointestinal: Negative for vomiting and diarrhea.  Genitourinary: Negative for decreased urine volume.  Musculoskeletal: Positive for back pain.  Neurological: Negative for weakness.  Psychiatric/Behavioral: Positive for agitation.    Allergies  Ibuprofen; Shellfish-derived products; and Ace inhibitors  Home Medications   Current Outpatient Rx  Name Route Sig Dispense Refill  . ASPIRIN 81 MG PO TBEC Oral  Take 81 mg by mouth daily.      Marland Kitchen CITALOPRAM HYDROBROMIDE 20 MG PO TABS Oral Take 20 mg by mouth daily.      . CYCLOBENZAPRINE HCL 5 MG PO TABS Oral Take 5 mg by mouth 3 (three) times daily as needed. For muscle spasms.    . DONEPEZIL HCL 10 MG PO TABS  TAKE ONE TABLET BY MOUTH EVERY DAY 30 tablet 11  . FAMOTIDINE 40 MG PO TABS Oral Take 40 mg by mouth at bedtime.      . RESOURCE BREEZE PO LIQD Oral Take 1 Container by mouth daily after supper.    Marland Kitchen FLUTICASONE PROPIONATE 50 MCG/ACT NA SUSP Nasal Place 2 sprays into the nose daily as needed. For allergies.    . FUROSEMIDE 20 MG PO TABS Oral Take 1 tablet (20 mg total) by mouth daily. 30 tablet 5  . HYDROCODONE-ACETAMINOPHEN 5-500 MG PO TABS Oral Take 1 tablet by mouth every 6 (six) hours as needed.  For pain. 30 tablet 0  . INSULIN GLARGINE 100 UNIT/ML Ceiba SOLN Subcutaneous Inject 45 Units into the skin at bedtime. 10 mL 4  . LABETALOL HCL 100 MG PO TABS Oral Take 1 tablet (100 mg total) by mouth 2 (two) times daily.    Marland Kitchen MEMANTINE HCL 5 MG PO TABS Oral Take 5 mg by mouth 2 (two) times daily.    Marland Kitchen METFORMIN HCL 500 MG PO TABS Oral Take 1,500 mg by mouth daily with breakfast.    . MONTELUKAST SODIUM 10 MG PO TABS Oral Take 10 mg by mouth at bedtime.    . ADULT MULTIVITAMIN W/MINERALS CH Oral Take 1 tablet by mouth daily.      Marland Kitchen NIFEDICAL XL 30 MG PO TB24  TAKE ONE TABLET BY MOUTH DAILY 90 tablet 1  . NYSTATIN 100000 UNIT/GM EX POWD Topical Apply 100,000 g topically 2 (two) times daily as needed. For yeast infection between breasts.    Marland Kitchen PRAVASTATIN SODIUM 80 MG PO TABS Oral Take 80 mg by mouth daily.    Marland Kitchen RISPERIDONE 0.5 MG PO TABS Oral Take 0.5 mg by mouth 2 (two) times daily.      Marland Kitchen PREDNISONE 5 MG PO TABS Oral Take 1 tablet (5 mg total) by mouth daily. Take 3 tabs 3/23, take 2 tabs 3/24 take 1 tab 3/25 then stop.      BP 141/62  Pulse 76  Temp(Src) 98.2 F (36.8 C) (Oral)  SpO2 98%  Physical Exam  Constitutional: She appears well-developed and well-nourished.  HENT:  Head: Normocephalic and atraumatic.  Eyes: Pupils are equal, round, and reactive to light.  Neck: Normal range of motion.  Cardiovascular: Normal rate.   Pulmonary/Chest: Effort normal.  Abdominal: Soft. She exhibits no distension. There is no tenderness.  Musculoskeletal: Normal range of motion. She exhibits no edema and no tenderness.       Pelvis is stable  Neurological: She is alert.  Skin: Skin is warm. No erythema.  Psychiatric: Her affect is labile. She is communicative.    ED Course  Procedures (including critical care time)  Labs Reviewed  GLUCOSE, CAPILLARY - Abnormal; Notable for the following:    Glucose-Capillary 107 (*)    All other components within normal limits  CBC - Abnormal; Notable  for the following:    Platelets 445 (*)    All other components within normal limits  URINALYSIS, ROUTINE W REFLEX MICROSCOPIC - Abnormal; Notable for the following:    Glucose, UA  250 (*)    All other components within normal limits  POCT I-STAT, CHEM 8 - Abnormal; Notable for the following:    Glucose, Bld 64 (*)    All other components within normal limits   Dg Chest 2 View  06/04/2011  *RADIOLOGY REPORT*  Clinical Data: Cough, congestion, weakness.  Multiple falls.  CHEST - 2 VIEW  Comparison: 05/19/2011  Findings: Borderline heart size with normal pulmonary vascularity. Calcified and tortuous aorta.  Mild interstitial fibrosis.  No focal airspace consolidation in the lungs.  No blunting of costophrenic angles.  Degenerative changes in the thoracic spine. No significant change since the prior study.  IMPRESSION: No evidence of active pulmonary disease.  Original Report Authenticated By: Marlon Pel, M.D.   Dg Pelvis 1-2 Views  06/04/2011  *RADIOLOGY REPORT*  Clinical Data: Bilateral hip pain after multiple recent falls.  PELVIS - 1-2 VIEW  Comparison: CT abdomen and pelvis 03/07/2011  Findings: Degenerative changes in the lower lumbar spine and in both hips.  The hips appear otherwise intact.  No evidence of acute fracture or subluxation.  The pelvis appears intact.  SI joints and symphysis pubis appear intact.  Sacral struts appear symmetrical. Calcification in the pelvis suggesting a phlebolith or vascular calcification.  Additional vascular calcifications also present.  IMPRESSION: Degenerative changes in the hips.  No evidence of acute fracture or subluxation.  Original Report Authenticated By: Marlon Pel, M.D.   Ct Head Wo Contrast  06/04/2011  *RADIOLOGY REPORT*  Clinical Data: Multiple falls over the last 4 days.  CT HEAD WITHOUT CONTRAST  Technique:  Contiguous axial images were obtained from the base of the skull through the vertex without contrast.  Comparison: 03/16/2011   Findings: Diffuse cerebral atrophy.  Low attenuation change in the deep white matter consistent with central atrophy.  Lacunar infarcts in the basal ganglia.  No mass effect or midline shift. No abnormal extra-axial fluid collections.  No ventricular dilatation.  Gray-white matter junctions are distinct.  Basal cisterns are not effaced.  No evidence of acute intracranial hemorrhage.  No significant change since previous study.  No depressed skull fractures.  Visualized paranasal sinuses demonstrate diffuse mucosal membrane thickening without acute air- fluid level.  Opacification of the right mastoid air cells.  IMPRESSION: Chronic atrophy and small vessel ischemic changes.  Old lacunar infarcts.  No acute intracranial abnormalities.  Opacification of right mastoid air cells.  Mucosal thickening in the paranasal sinuses.  Original Report Authenticated By: Marlon Pel, M.D.     1. Frequent falls   2. Dementia     Discussed lab findings and x-ray findings with daughter.  She would like to take her mother, whom she does not want to explore the option of nursing home placement at this time  MDM  Frequent falls with concern for pelvic fracture hip fracture        Arman Filter, NP 06/04/11 0518  Arman Filter, NP 06/04/11 [phone]

## 2011-06-04 NOTE — ED Notes (Signed)
Brought in by EMS with c/o pain after her fall. Per daughter, pt has been falling since Friday--- had a fall each day. Pt reports back pain, bilateral hip pain radiating to flank area. Also reported by EMS that upon arrival at pt's home, pt's CBG was 52--- was given D50 25 ml en route. Pt denies dizziness, headache or nausea.

## 2011-06-04 NOTE — Discharge Instructions (Signed)
Fall Prevention  Falls cause injuries and can affect all age groups. It is possible to prevent falls.  HOME CARE  Wear shoes with non-slip soles (not slippers).   Have your home and outside area well lit.   Use night lights throughout your home.   Remove clutter.   Clean up floor spills.   Remove throw rugs or fasten them to the floor with carpet tape.   Do not place electrical cords across pathways.   Put grab bars by your tub, shower, and toilet. Do not use towel bars as grab bars.   Put handrails on both sides of the stairway.   Do not climb on stools or stepladders.   Do not wax your floors.   Repair uneven or unsafe sidewalks, walkways, or stairs.   Keep items you use a lot within reach.   Be aware of pets.   Change positions slowly.   Sit up on the side of your bed for a few minutes before standing. This prevents dizziness.   Keep a bedside commode by your bed at night.   If you have a cane or walker, be sure to use it whenever you are walking.   Have your eyes and hearing checked every year.  Ask your doctor what other things you can do to prevent falls. GET HELP RIGHT AWAY IF:  You feel dizzy, weak, or unsteady on your feet.   You feel confused.   You fall and hurt yourself.  Document Released: 12/15/2008 Document Revised: 02/07/2011 Document Reviewed: 12/15/2008 [facility] Patient Information 2012 [place], [place].Fall Prevention, Elderly Falls are the leading cause of injuries, accidents, and accidental deaths in people over the age of 86. Falling is a real threat to your ability to live on your own. CAUSES   Poor eyesight or poor hearing can make you more likely to fall.   Illnesses and physical conditions can affect your strength and balance.   Poor lighting, throw rugs and pets in your home can make you more likely to trip or slip.   The side effects of some medicines can upset your balance and lead to falling. These include medicines for  depression, sleep problems, high blood pressure, diabetes, and heart conditions.  PREVENTION  Be sure your home is as safe as possible. Here are some tips:  Wear shoes with non-skid soles (not house slippers).   Be sure your home and outside area are well lit.   Use night lights throughout your house, including hallways and stairways.   Remove clutter and clean up spills on floors and walkways.   Remove throw rugs or fasten them to the floor with carpet tape. Tack down carpet edges.   Do not place electrical cords across pathways.   Install grab bars in your bathtub, shower, and toilet area. Towel bars should not be used as a grab bar.   Install handrails on both sides of stairways.   Do not climb on stools or stepladders. Get someone else to help with jobs that require climbing.   Do not wax your floors at all, or use a non-skid wax.   Repair uneven or unsafe sidewalks, walkways or stairs.   Keep frequently used items within reach.   Be aware of pets so you do not trip.  Get regular check-ups from your doctor, and take good care of yourself:  Have your eyes checked every year for vision changes, cataracts, glaucoma, and other eye problems. Wear eyeglasses as directed.   Have your hearing  checked every 2 years, or anytime you or others think that you cannot hear well. Use hearing aids as directed.   See your caregiver if you have foot pain or corns. Sore feet can contribute to falls.   Let your caregiver know if a medicine is making you feel dizzy or making you lose your balance.   Use a cane, walker, or wheelchair as directed. Use walker or wheelchair brakes when getting in and out.   When you get up from bed, sit on the side of the bed for 1 to 2 minutes before you stand up. This will give your blood pressure time to adjust, and you will feel less dizzy.   If you need to go to the bathroom often, consider using a bedside commode.  Keep your body in good shape:  Get  regular exercise, especially walking.   Do exercises to strengthen the muscles you use for walking and lifting.   Do not smoke.   Minimize use of alcohol.  SEEK IMMEDIATE MEDICAL CARE IF:   You feel dizzy, weak, or unsteady on your feet.   You feel confused.   You fall.  Document Released: 02/18/2005 Document Revised: 02/07/2011 Document Reviewed: 08/15/2006 [facility] Patient Information 2012 [place], [place].Home Safety and Preventing Falls Falls are a leading cause of injury and while they affect all age groups, falls have greater short-term and long-term impact on older age groups. However, falls should not be a part of life or aging. It is possible for individuals and their families to use preventive measures to significantly decrease the likelihood that anyone, especially an older adult, will fall. There are many simple measures which can make your home safer with respect to preventing falls. The following actions can help reduce falls among all members of your family and are especially important as you age, when your balance, lower limb strength, coordination, and eyesight may be declining. The use of preventive measures will help to reduce you and your family's risk of falls and serious medical consequences. OUTDOORS  Repair cracks and edges of walkways and driveways.   Remove high doorway thresholds and trim shrubbery on the main path into your home.   Ensure there is good outside lighting at main entrances and along main walkways.   Clear walkways of tools, rocks, debris, and clutter.   Check that handrails are not broken and are securely fastened. Both sides of steps should have handrails.   In the garage, be attentive to and clean up grease or oil spills on the cement. This can make the surface extremely slippery.   In winter, have leaves, snow, and ice cleared regularly.   Use sand or salt on walkways during winter months.  BATHROOM  Install grab bars by the toilet and  in the tub and shower.   Use non-skid mats or decals in the tub or shower.   If unable to easily stand unsupported while showering, place a plastic non slip stool in the shower to sit on when needed.   Install night lights.   Keep floors dry and clean up all water on the floor immediately.   Remove soap buildup in tub or shower on a regular basis.   Secure bath mats with non-slip, double-sided rug tape.   Remove tripping hazards from the floors.  BEDROOMS  Install night lights.   Do not use oversized bedding.   Make sure a bedside light is easy to reach.   Keep a telephone by your bedside.  Make sure that you can get in and out of your bed easily.   Have a firm chair, with side arms, to use for getting dressed.   Remove clutter from around closets.   Store clothing, bed coverings, and other household items where you can reach them comfortably.   Remove tripping hazards from the floor.  LIVING AREAS AND STAIRWAYS  Turn on lights to avoid having to walk through dark areas.   Keep lighting uniform in each room. Place brighter lightbulbs in darker areas, including stairways.   Replace lightbulbs that burn out in stairways immediately.   Arrange furniture to provide for clear pathways.   Keep furniture in the same place.   Eliminate or tape down electrical cables in high traffic areas.   Place handrails on both sides of stairways. Use handrails when going up or down stairs.   Most falls occur on the top or bottom 3 steps.   Fix any loose handrails. Make sure handrails on both sides of the stairways are as long as the stairs.   Remove all walkway obstacles.   Coil or tape electrical cords off to the side of walking areas and out of the way. If using many extension cords, have an electrician put in a new wall outlet to reduce or eliminate them.   Make sure spills are cleaned up quickly and allow time for drying before walking on freshly cleaned floors.   Firmly  attach carpet with non-skid or two-sided tape.   Keep frequently used items within easy reach.   Remove tripping hazards such as throw rugs and clutter in walkways. Never leave objects on stairs.   Get rid of throw rugs elsewhere if possible.   Eliminate uneven floor surfaces.   Make sure couches and chairs are easy to get into and out of.   Check carpeting to make sure it is firmly attached along stairs.   Make repairs to worn or loose carpet promptly.   Select a carpet pattern that does not visually hide the edge of steps.   Avoid placing throw rugs or scatter rugs at the top or bottom of stairways, or properly secure with carpet tape to prevent slippage.   Have an electrician put in a light switch at the top and bottom of the stairs.   Get light switches that glow.   Avoid the following practices: hurrying, inattention, obscured vision, carrying large loads, and wearing slip-on shoes.   Be aware of all pets.  KITCHEN  Place items that are used frequently, such as dishes and food, within easy reach.   Keep handles on pots and pans toward the center of the stove. Use back burners when possible.   Make sure spills are cleaned up quickly and allow time for drying.   Avoid walking on wet floors.   Avoid hot utensils and knives.   Position shelves so they are not too high or low.   Place commonly used objects within easy reach.   If necessary, use a sturdy step stool with a grab bar when reaching.   Make sure electrical cables are out of the way.   Do not use floor polish or wax that makes floors slippery.  OTHER HOME FALL PREVENTION STRATEGIES  Wear low heel or rubber sole shoes that are supportive and fit well.   Wear closed toe shoes.   Know and watch for side effects of medications. Have your caregiver or pharmacist look at all your medicines, even over-the-counter medicines. Some medicines  can make you sleepy or dizzy.   Exercise regularly. Exercise makes you  stronger and improves your balance and coordination.   Limit use of alcohol.   Use eyeglasses if necessary and keep them clean. Have your vision checked every year.   Organize your household in a manner that minimizes the need to walk distances when hurried, or go up and down stairs unnecessarily. For example, have a phone placed on at least each floor of your home. If possible, have a phone beside each sitting or lying area where you spend the most time at home. Keep emergency numbers posted at all phones.   Use non-skid floor wax.   When using a ladder, make sure:   The base is firm.   All ladder feet are on level ground.   The ladder is angled against the wall properly.   When climbing a ladder, face the ladder and hold the ladder rungs firmly.   If reaching, always keep your hips and body weight centered between the rails.   When using a stepladder, make sure it is fully opened and both spreaders are firmly locked.   Do not climb a closed stepladder.   Avoid climbing beyond the second step from the top of a stepladder and the 4th rung from the top of an extension ladder.   Learn and use mobility aids as needed.   Change positions slowly. Arise slowly from sitting and lying positions. Sit on the edge of your bed before getting to your feet.   If you have a history of falls, ask someone to add color or contrast paint or tape to grab bars and handrails in your home.   If you have a history of falls, ask someone to place contrasting color strips on first and last steps.   Install an electrical emergency response system if you need one, and know how to use it.   If you have a medical or other condition that causes you to have limited physical strength, it is important that you reach out to family and friends for occasional help.  FOR CHILDREN:  If young children are in the home, use safety gates. At the top of stairs use screw-mounted gates; use pressure-mounted gates for the  bottom of the stairs and doorways between rooms.   Young children should be taught to descend stairs on their stomachs, feet first, and later using the handrail.   Keep drawers fully closed to prevent them from being climbed on or pulled out entirely.   Move chairs, cribs, beds and other furniture away from windows.   Consider installing window guards on windows ground floor and up, unless they are emergency fire exits. Make sure they have easy release mechanisms.   Consider installing special locks that only allow the window to be opened to a certain height.   Never rely on window screens to prevent falls.   Never leave babies alone on changing tables, beds or sofas. Use a changing table that has a restraining strap.   When a child can pull to a standing position, the crib mattress should be adjusted to its lowest position. There should be at least 26 inches between the top rails of the crib drop side and the mattress. Toys, bumper pads, and other objects that can be used as steps to climb out should be removed from the crib.   On bunk beds never allow a child under age 36 to sleep on the top bunk. For older children, if  the upper bunk is not against a wall, use guard rails on both sides. No matter how old a child is, keep the guard rails in place on the top bunk since children roll during sleep. Do not permit horseplay on bunks.   Grass and soil surfaces beneath backyard playground equipment should be replaced with hardwood chips, shredded wood mulch, sand, pea gravel, rubber, crushed stone, or another safer material at depths of at least 9 to 12 inches.   When riding bikes or using skates, skateboards, skis, or snowboards, require children to wear helmets. Look for those that have stickers stating that they meet or exceed safety standards.   Vertical posts or pickets in deck, balcony, and stairway railings should be no more than 3 1/2 inches apart if a young baby will have access to the  area. The space between horizontal rails or bars, and between the floor and the first horizontal rail or bar, should be no more than 3 1/2 inches.  Document Released: 02/08/2002 Document Revised: 02/07/2011 Document Reviewed: 12/08/2008 [facility] Patient Information 2012 [place], [place]. As discussed.  Her mom does not have any fractures in her hips and femurs, lower back.  Her chest x-ray is normal.  No pneumonia.  Urine is normal, as well as her head CT

## 2011-06-05 ENCOUNTER — Ambulatory Visit (INDEPENDENT_AMBULATORY_CARE_PROVIDER_SITE_OTHER): Payer: Medicare PPO | Admitting: Internal Medicine

## 2011-06-05 VITALS — BP 144/82 | HR 111 | Temp 98.3°F | Wt 170.8 lb

## 2011-06-05 DIAGNOSIS — R269 Unspecified abnormalities of gait and mobility: Secondary | ICD-10-CM

## 2011-06-05 DIAGNOSIS — R296 Repeated falls: Secondary | ICD-10-CM | POA: Insufficient documentation

## 2011-06-05 DIAGNOSIS — Z8679 Personal history of other diseases of the circulatory system: Secondary | ICD-10-CM

## 2011-06-05 DIAGNOSIS — F068 Other specified mental disorders due to known physiological condition: Secondary | ICD-10-CM

## 2011-06-05 DIAGNOSIS — Z9181 History of falling: Secondary | ICD-10-CM

## 2011-06-05 DIAGNOSIS — R531 Weakness: Secondary | ICD-10-CM

## 2011-06-05 DIAGNOSIS — R5383 Other fatigue: Secondary | ICD-10-CM

## 2011-06-05 MED ORDER — RISPERIDONE 0.5 MG PO TABS: ORAL_TABLET | ORAL | Status: DC

## 2011-06-05 NOTE — Patient Instructions (Addendum)
Ok to increase the risperdal as discussed Continue all other medications as before You will be contacted regarding the referral for: Home health, and physical therapy No need for further blood work today Please return in August 2013 with Lab testing done 3-5 days before, or sooner if needed

## 2011-06-05 NOTE — ED Provider Notes (Signed)
Medical screening examination/treatment/procedure(s) were performed by non-physician practitioner and as supervising physician I was immediately available for consultation/collaboration.  Blakely Gluth, MD 06/05/11 2129 

## 2011-06-09 ENCOUNTER — Encounter: Payer: Self-pay | Admitting: Internal Medicine

## 2011-06-09 NOTE — Assessment & Plan Note (Signed)
Overall worsening, end stage, for increased risperdal, Continue all other medications as before,  to f/u any worsening symptoms or concerns

## 2011-06-09 NOTE — Assessment & Plan Note (Signed)
stable overall by hx and exam, and pt to continue medical treatment as before 

## 2011-06-09 NOTE — Assessment & Plan Note (Signed)
May not be able to improve overall strength given her severe co-morbids, will try PT to help moblize and seemed to improve somewhat in rehab per daughter

## 2011-06-09 NOTE — Progress Notes (Signed)
Subjective:    Patient ID: Alicia Kent, female    DOB: Aug 17, 1930, 78 y.o.   MRN: [id number]  HPI  Here to f/u with daughter, and several other family members, at their wits end due to pt needs becoming overwhelming.  Pt hosp mar 17-22, then rehab for one wk, home since mar 28, fallen 5 times, seen in ER 2 nights ago in ER, neg eval for acute, pt requires now 2 people to lift and turn her as she is not helpful, has bedside commode, hallucinations seem overall worse in general day and night now, appetite ok, cbg's usually in the lower 100's though was 52 recently,  Wt from 172 to 170 over the past mo, o/w takes po ok, and will take meds but is a chore as well.   Past Medical History  Diagnosis Date  . Diabetes mellitus type II   . Hyperlipidemia   . HTN (hypertension)   . GERD (gastroesophageal reflux disease)   . Allergic rhinitis   . Depression   . Dementia     mild  . History of CVA (cerebrovascular accident)   . LBBB (left bundle branch block)   . AAA (abdominal aortic aneurysm)     small, last check in 05/2009, due for repeat in 05/2010  . Carotid stenosis 09/2009    40-59% RICA, 0-39% LICA  . LVH (left ventricular hypertrophy) 10/2009    Moderate, EF 55-60%, no regional wall motion abnormalities  . Shellfish allergy     anaphylaxis  . Osteopenia   . Vitamin d deficiency   . Fracture of rib of left side 12/2009  . Asthma 01/27/2011   Past Surgical History  Procedure Date  . Ankle surgery     Right, by Ortho in [place]  . Tracheostomy 2010    anaphylactic episode on vent  . Tubal ligation     reports that she has been smoking.  She does not have any smokeless tobacco history on file. She reports that she does not drink alcohol or use illicit drugs. family history includes Diabetes type II in her other; Hypertension in her brother; Rheum arthritis in her sister; and Stroke in her brother. Allergies  Allergen Reactions  . Ibuprofen Anaphylaxis  . Shellfish-Derived Products  Anaphylaxis    Swelling tongue and throat  . Ace Inhibitors Other (See Comments)    unknown   Current Outpatient Prescriptions on File Prior to Visit  Medication Sig Dispense Refill  . aspirin 81 MG EC tablet Take 81 mg by mouth daily.        . citalopram (CELEXA) 20 MG tablet Take 20 mg by mouth daily.        . cyclobenzaprine (FLEXERIL) 5 MG tablet Take 5 mg by mouth 3 (three) times daily as needed. For muscle spasms.      Marland Kitchen donepezil (ARICEPT) 10 MG tablet TAKE ONE TABLET BY MOUTH EVERY DAY  30 tablet  11  . famotidine (PEPCID) 40 MG tablet Take 40 mg by mouth at bedtime.        . feeding supplement (RESOURCE BREEZE) LIQD Take 1 Container by mouth daily after supper.      . fluticasone (FLONASE) 50 MCG/ACT nasal spray Place 2 sprays into the nose daily as needed. For allergies.      . furosemide (LASIX) 20 MG tablet Take 1 tablet (20 mg total) by mouth daily.  30 tablet  5  . HYDROcodone-acetaminophen (VICODIN) 5-500 MG per tablet Take 1 tablet by mouth every  6 (six) hours as needed. For pain.  30 tablet  0  . insulin glargine (LANTUS) 100 UNIT/ML injection Inject 45 Units into the skin at bedtime.  10 mL  4  . labetalol (NORMODYNE) 100 MG tablet Take 1 tablet (100 mg total) by mouth 2 (two) times daily.      . memantine (NAMENDA) 5 MG tablet Take 5 mg by mouth 2 (two) times daily.      . metFORMIN (GLUCOPHAGE) 500 MG tablet Take 1,500 mg by mouth daily with breakfast.      . montelukast (SINGULAIR) 10 MG tablet Take 10 mg by mouth at bedtime.      . Multiple Vitamin (MULITIVITAMIN WITH MINERALS) TABS Take 1 tablet by mouth daily.        Marland Kitchen NIFEDICAL XL 30 MG 24 hr tablet TAKE ONE TABLET BY MOUTH DAILY  90 tablet  1  . nystatin (MYCOSTATIN) powder Apply 100,000 g topically 2 (two) times daily as needed. For yeast infection between breasts.      . pravastatin (PRAVACHOL) 80 MG tablet Take 80 mg by mouth daily.      . risperiDONE (RISPERDAL) 0.5 MG tablet 1-2 tabs by mouth twice per day  120  tablet  11   Review of Systems Unable due to dementia     Objective:   Physical Exam BP 144/82  Pulse 111  Temp 98.3 F (36.8 C)  Wt 170 lb 12.8 oz (77.474 kg)  SpO2 95% Physical Exam  VS noted, examined in wheelchair Constitutional: Pt appears well-developed and well-nourished.  HENT: Head: Normocephalic.  Right Ear: External ear normal.  Left Ear: External ear normal.  Eyes: Conjunctivae and EOM are normal. Pupils are equal, round, and reactive to light.  Neck: Normal range of motion. Neck supple.  Cardiovascular: Normal rate and regular rhythm.   Pulmonary/Chest: Effort normal and breath sounds normal.  Abd:  Soft, NT, non-distended, + BS Neurological: Pt is alert. No cranial nerve deficit.  Skin: Skin is warm. No erythema.  Psychiatric: .Pt not speaking, severe dementia, does not appear depressed affect    Assessment & Plan:

## 2011-06-09 NOTE — Assessment & Plan Note (Signed)
For PT as above 

## 2011-06-09 NOTE — Assessment & Plan Note (Addendum)
Exam bening, c/w geriatric decline, most recent data reviewed with pt, and pt to continue medical treatment as before, for Home health eval for needs Lab Results  Component Value Date   WBC 10.4 06/04/2011   HGB 13.3 06/04/2011   HCT 39.0 06/04/2011   PLT 445* 06/04/2011   GLUCOSE 64* 06/04/2011   CHOL 109 05/20/2011   TRIG 71 05/20/2011   HDL 42 05/20/2011   LDLCALC 53 05/20/2011   ALT 7 05/20/2011   AST 10 05/20/2011   NA 141 06/04/2011   K 4.0 06/04/2011   CL 104 06/04/2011   CREATININE 0.70 06/04/2011   BUN 7 06/04/2011   CO2 31 05/23/2011   TSH 0.910 05/20/2011   INR 1.30 05/20/2011   HGBA1C 6.4* 05/20/2011   MICROALBUR 1.5 09/19/2010    Note: time for pt hx and exam, review of record with family and pt in room, determination of diagnosis and plan for eval and tx is > 40 min

## 2011-06-11 ENCOUNTER — Telehealth: Payer: Self-pay

## 2011-06-11 NOTE — Telephone Encounter (Signed)
Verbal ok?

## 2011-06-11 NOTE — Telephone Encounter (Signed)
[facility] informed of verbal ok by  MD

## 2011-06-11 NOTE — Telephone Encounter (Signed)
Alicia Kent called to request verbal order for hospital bed with get overlay and a trapeze bar or a patient lift. Verbal authorization okay?

## 2011-06-27 ENCOUNTER — Telehealth: Payer: Self-pay

## 2011-06-27 NOTE — Telephone Encounter (Signed)
Pt's daughter called stating that pt has been experiencing fecal incontinence several times a day and cannot remember where the restroom is.  Daughter also says that pt is still have hallucination, worse a night, which causes her to stay awake most of the night.  Daughter is concerned that pt's dementia is worsening and is requesting advisement for MD.

## 2011-06-27 NOTE — Telephone Encounter (Signed)
I agree this is likely the case since this seemed to be the case at the last visit;  she is most recently on the risperdal to help with the behavior symtpoms in addition to other meds;   we could consider increased risperdal but this is risky and the current dose is the usual one, I would like to continue as is for now

## 2011-06-27 NOTE — Telephone Encounter (Signed)
Patient daughter informed of MD's response.

## 2011-07-08 ENCOUNTER — Telehealth: Payer: Self-pay | Admitting: Internal Medicine

## 2011-07-08 NOTE — Telephone Encounter (Signed)
Informed [facility] at [phone] informed of MD's response.  [facility] will call and inform the patients family.

## 2011-07-08 NOTE — Telephone Encounter (Signed)
Needs OV, I would not normally dx CHF or tx over the phone  Consider UC or ER if worsens

## 2011-07-08 NOTE — Telephone Encounter (Signed)
Called back number given but is incorrect number.  Called [facility] [phone] ext. [phone] left [facility] RN a message to call back.

## 2011-07-08 NOTE — Telephone Encounter (Signed)
Call from [facility] Nurse relates that per patient daughter and assessment pt is experiencing increased SOB, Wheezing and Dry Cough, VS as follows B/P 130/62, o2 sat 96% on RA, Pulse 70, Resp rate 20. Also reports pt weight today 154 (but she is not weighed daily. HH nurse states at during her last hospital stay pt was Dx. With CHF  [facility] nurse is questioning possible need for  home CXR, labs, and Diuretic.... Please Advise

## 2011-07-09 ENCOUNTER — Ambulatory Visit: Payer: Medicare PPO | Admitting: Internal Medicine

## 2011-07-09 DIAGNOSIS — Z0289 Encounter for other administrative examinations: Secondary | ICD-10-CM

## 2011-07-10 ENCOUNTER — Telehealth: Payer: Self-pay

## 2011-07-10 NOTE — Telephone Encounter (Signed)
Ok for verbal 

## 2011-07-10 NOTE — Telephone Encounter (Signed)
[facility] would is requesting a verbal ok to continue PT and home health aid twice weekly for 4 more weeks. Call back number of [facility] [phone]

## 2011-07-11 NOTE — Telephone Encounter (Signed)
Called [facility] Olegario Messier) left detailed message verbal ok per MD on request and to call back to confirm received message.

## 2011-07-12 ENCOUNTER — Ambulatory Visit (INDEPENDENT_AMBULATORY_CARE_PROVIDER_SITE_OTHER)
Admission: RE | Admit: 2011-07-12 | Discharge: 2011-07-12 | Disposition: A | Discharge status: Home / Self Care | Payer: Medicare PPO | Source: Ambulatory Visit | Attending: Internal Medicine | Admitting: Internal Medicine

## 2011-07-12 ENCOUNTER — Ambulatory Visit (INDEPENDENT_AMBULATORY_CARE_PROVIDER_SITE_OTHER): Payer: Medicare PPO | Admitting: Internal Medicine

## 2011-07-12 ENCOUNTER — Encounter: Payer: Self-pay | Admitting: Internal Medicine

## 2011-07-12 VITALS — BP 142/80 | HR 74 | Temp 97.5°F | Resp 16 | Wt 162.0 lb

## 2011-07-12 DIAGNOSIS — E119 Type 2 diabetes mellitus without complications: Secondary | ICD-10-CM

## 2011-07-12 DIAGNOSIS — R05 Cough: Secondary | ICD-10-CM

## 2011-07-12 DIAGNOSIS — I10 Essential (primary) hypertension: Secondary | ICD-10-CM

## 2011-07-12 DIAGNOSIS — R059 Cough, unspecified: Secondary | ICD-10-CM

## 2011-07-12 DIAGNOSIS — J309 Allergic rhinitis, unspecified: Secondary | ICD-10-CM

## 2011-07-12 DIAGNOSIS — R296 Repeated falls: Secondary | ICD-10-CM

## 2011-07-12 DIAGNOSIS — Z9181 History of falling: Secondary | ICD-10-CM

## 2011-07-12 MED ORDER — METHYLPREDNISOLONE ACETATE 80 MG/ML IJ SUSP
120.0000 mg | Freq: Once | INTRAMUSCULAR | Status: AC
  Administered 2011-07-12: 120 mg via INTRAMUSCULAR

## 2011-07-12 MED ORDER — FEXOFENADINE HCL 180 MG PO TABS: 180.0000 mg | ORAL_TABLET | Freq: Every day | ORAL | Status: DC

## 2011-07-12 NOTE — Patient Instructions (Addendum)
You had the steroid shot today Take all new medications as prescribed - the generic allegra Continue all other medications as before Please have the pharmacy call with any refills you may need. Please check on whether she is taking the furosemide at 20 mg - OK to continue if she is, and OK to start at this time if not taking Please go to XRAY in the Basement for the x-ray test You will be contacted by phone if any changes need to be made immediately.  Otherwise, you will receive a letter about your results with an explanation. You can also take Delsym OTC for cough, and/or Mucinex (or it's generic off brand) for congestion

## 2011-07-13 NOTE — Assessment & Plan Note (Signed)
Mild to mod, for depomedrol IM, and allegra prn,  to f/u any worsening symptoms or concerns

## 2011-07-13 NOTE — Assessment & Plan Note (Signed)
To cont ortho f/u and PT but may not be amenable to signficanit improvement due to dementia

## 2011-07-13 NOTE — Assessment & Plan Note (Addendum)
I suspect most likely related to post nasal gtt;  Exam not c/w chf;  To cont meds as is including the lasix 20 I think she prob is taking (daughter to check), for cxr to r/o pna but less likely than allergies as well  Note:  Time for pt hx, exam, review of record with daughter and pt in room, determination of diagnoses and plan for further eval and tx is > 40 min

## 2011-07-13 NOTE — Assessment & Plan Note (Signed)
stable overall by hx and exam, most recent data reviewed with pt, and pt to continue medical treatment as before Lab Results  Component Value Date   HGBA1C 6.4* 05/20/2011    

## 2011-07-13 NOTE — Progress Notes (Signed)
Subjective:    Patient ID: Alicia Kent, female    DOB: 03-13-1930, 78 y.o.   MRN: [id number]  HPI here with daughter to f/u with some concern for CHF per home nurse due to congested cough;  Chart shows wt loss from 170 to 162, no LE edema but daughter concerned b/c has pulm edema without leg swelling;  Pt with severe dementia, has PT re-eval 2 days ago but not clear pt can understand to cooperate;  Does ok with one therapist but does not continue the excercises when therapist not there.   Does have several wks ongoing nasal allergy symptoms with clear congestion, itch and sneeze , and cough may have been worse in this time as well.  Has known bilat hipo DJD with recurring pain, seeing Dr Gigi Gin [place].  BP slight elevation today.  Daughter realizes on med review today pt may not be taking the lasix but might be and will check bottles at home.  Has had 5 falls since feb but seem more related to pain then dizziness.  Knees no significant pain.   Pt denies fever, wt loss, night sweats, loss of appetite, or other constitutional symptoms.  No worsening GU symptoms - daughter  Denies urinary symptoms such as dysuria, frequency, urgency,or hematuria.  Denies worsening reflux, dysphagia, abd pain, n/v, bowel change or blood. Past Medical History  Diagnosis Date  . Diabetes mellitus type II   . Hyperlipidemia   . HTN (hypertension)   . GERD (gastroesophageal reflux disease)   . Allergic rhinitis   . Depression   . Dementia     mild  . History of CVA (cerebrovascular accident)   . LBBB (left bundle branch block)   . AAA (abdominal aortic aneurysm)     small, last check in 05/2009, due for repeat in 05/2010  . Carotid stenosis 09/2009    40-59% RICA, 0-39% LICA  . LVH (left ventricular hypertrophy) 10/2009    Moderate, EF 55-60%, no regional wall motion abnormalities  . Shellfish allergy     anaphylaxis  . Osteopenia   . Vitamin d deficiency   . Fracture of rib of left side 12/2009  . Asthma  01/27/2011   Past Surgical History  Procedure Date  . Ankle surgery     Right, by Ortho in [place]  . Tracheostomy 2010    anaphylactic episode on vent  . Tubal ligation     reports that she has been smoking.  She does not have any smokeless tobacco history on file. She reports that she does not drink alcohol or use illicit drugs. family history includes Diabetes type II in her other; Hypertension in her brother; Rheum arthritis in her sister; and Stroke in her brother. Allergies  Allergen Reactions  . Ibuprofen Anaphylaxis  . Shellfish-Derived Products Anaphylaxis    Swelling tongue and throat  . Ace Inhibitors Other (See Comments)    unknown   Current Outpatient Prescriptions on File Prior to Visit  Medication Sig Dispense Refill  . aspirin 81 MG EC tablet Take 81 mg by mouth daily.        . citalopram (CELEXA) 20 MG tablet Take 20 mg by mouth daily.        . cyclobenzaprine (FLEXERIL) 5 MG tablet Take 5 mg by mouth 3 (three) times daily as needed. For muscle spasms.      Marland Kitchen donepezil (ARICEPT) 10 MG tablet TAKE ONE TABLET BY MOUTH EVERY DAY  30 tablet  11  . famotidine (PEPCID) 40  MG tablet Take 40 mg by mouth at bedtime.        . feeding supplement (RESOURCE BREEZE) LIQD Take 1 Container by mouth daily after supper.      . fluticasone (FLONASE) 50 MCG/ACT nasal spray Place 2 sprays into the nose daily as needed. For allergies.      . furosemide (LASIX) 20 MG tablet Take 1 tablet (20 mg total) by mouth daily.  30 tablet  5  . HYDROcodone-acetaminophen (VICODIN) 5-500 MG per tablet Take 1 tablet by mouth every 6 (six) hours as needed. For pain.  30 tablet  0  . insulin glargine (LANTUS) 100 UNIT/ML injection Inject 45 Units into the skin at bedtime.  10 mL  4  . labetalol (NORMODYNE) 100 MG tablet Take 1 tablet (100 mg total) by mouth 2 (two) times daily.      . memantine (NAMENDA) 5 MG tablet Take 5 mg by mouth 2 (two) times daily.      . metFORMIN (GLUCOPHAGE) 500 MG tablet Take  1,500 mg by mouth daily with breakfast.      . montelukast (SINGULAIR) 10 MG tablet Take 10 mg by mouth at bedtime.      . Multiple Vitamin (MULITIVITAMIN WITH MINERALS) TABS Take 1 tablet by mouth daily.        Marland Kitchen NIFEDICAL XL 30 MG 24 hr tablet TAKE ONE TABLET BY MOUTH DAILY  90 tablet  1  . nystatin (MYCOSTATIN) powder Apply 100,000 g topically 2 (two) times daily as needed. For yeast infection between breasts.      . pravastatin (PRAVACHOL) 80 MG tablet Take 80 mg by mouth daily.      . risperiDONE (RISPERDAL) 0.5 MG tablet 1-2 tabs by mouth twice per day  120 tablet  11  . fexofenadine (ALLEGRA) 180 MG tablet Take 1 tablet (180 mg total) by mouth daily.  30 tablet  11   No current facility-administered medications on file prior to visit.   Review of Systems Review of Systems  Constitutional: Negative for diaphoresis and unexpected weight change.  HENT: Negative for drooling and tinnitus.   Eyes: Negative for photophobia and visual disturbance.  Respiratory: Negative for choking and stridor.   Gastrointestinal: Negative for vomiting and blood in stool.  Genitourinary: Negative for hematuria and decreased urine volume.  Musculoskeletal: Negative for gait problem.  Skin: Negative for color change and wound.     Objective:   Physical Exam BP 142/80  Pulse 74  Temp(Src) 97.5 F (36.4 C) (Oral)  Resp 16  Wt 162 lb (73.483 kg)  SpO2 97% Physical Exam  VS noted Constitutional: Pt appears well-developed and well-nourished.  HENT: Head: Normocephalic.  Right Ear: External ear normal.  Left Ear: External ear normal.  Bilat tm's mild erythema.  Sinus nontender.  Pharynx mild erythema Eyes: Conjunctivae and EOM are normal. Pupils are equal, round, and reactive to light.  Neck: Normal range of motion. Neck supple.  Cardiovascular: Normal rate and regular rhythm.   Pulmonary/Chest: Effort normal and breath sounds mild decreased, no rales or wheezing, has some bronchial congestion.  Abd:   Soft, NT, non-distended, + BS, no flank tender Neurological: Pt is alert. Not confused Skin: Skin is warm. No erythema. No edema LE's Psychiatric: Pt behavior is normal. Thought content c/w mod to severe dementia    Assessment & Plan:

## 2011-07-13 NOTE — Assessment & Plan Note (Signed)
stable overall by hx and exam, most recent data reviewed with pt, and pt to continue medical treatment as before BP Readings from Last 3 Encounters:  07/12/11 142/80  06/09/11 144/82  06/04/11 141/62

## 2011-07-23 ENCOUNTER — Telehealth: Payer: Self-pay | Admitting: Internal Medicine

## 2011-07-23 NOTE — Telephone Encounter (Signed)
Caller: Tracey/Child; PCP: Oliver Barre; CB#: [phone]; ; ; Call regarding Incontinence, Diarrhea; this is progressively worsening.  States her mom tells her she does not feel the urge at all.  States she is waking up every morning to a mess.  States her diet is not changed, her medications are not changed.  Having night sweats.  Afebrile.  Per protocol, emergent symptoms denied; advised appt within 24 hours.  Family declines; prefers appt 07/25/11 or so.   ALSO C/O LOWER BACK PAIN; ONSET BEFORE EASTER 2013; WOULD LIKE REFERRAL FOR EVALUATION OF THE BACK PAIN. ALSO STATES LASIX IS NOT IN HER MEDICATION SUPPLY, IN SPITE OF IT BEING ON MEDICATION LIST.   INFO TO OFFICE FOR STAFF/PROVIDER REVIEW/CALLBACK.   MAY REACH CARETAKER [phone].

## 2011-07-24 NOTE — Telephone Encounter (Signed)
This can wait until Dr. Jonny Ruiz returns

## 2011-07-25 ENCOUNTER — Other Ambulatory Visit: Payer: Self-pay | Admitting: Internal Medicine

## 2011-07-30 NOTE — Telephone Encounter (Signed)
Called informed the patients daughter [name] of MD's instructions. She agreed to all and did schedule appt. This week for the patient.

## 2011-07-30 NOTE — Telephone Encounter (Signed)
After being seen last visit, and now with diarrhea/incontinence problems I think ok to stop the lasix, as she has been off at least 3 months.  OK for immodium nightly before bed prn,  Needs to continue depends.  Ok for UA to assess for infection if the daughter wants (code for urinary incontinence)  Zella Ball to contact pt;  Ok for appt anytime this wk for the back pain

## 2011-08-02 ENCOUNTER — Ambulatory Visit (INDEPENDENT_AMBULATORY_CARE_PROVIDER_SITE_OTHER): Payer: Medicare PPO | Admitting: Internal Medicine

## 2011-08-02 ENCOUNTER — Encounter: Payer: Self-pay | Admitting: Internal Medicine

## 2011-08-02 VITALS — BP 118/66 | HR 69 | Temp 98.1°F | Wt 162.0 lb

## 2011-08-02 DIAGNOSIS — R32 Unspecified urinary incontinence: Secondary | ICD-10-CM

## 2011-08-02 DIAGNOSIS — I509 Heart failure, unspecified: Secondary | ICD-10-CM

## 2011-08-02 DIAGNOSIS — B370 Candidal stomatitis: Secondary | ICD-10-CM

## 2011-08-02 DIAGNOSIS — I5030 Unspecified diastolic (congestive) heart failure: Secondary | ICD-10-CM

## 2011-08-02 DIAGNOSIS — R531 Weakness: Secondary | ICD-10-CM

## 2011-08-02 DIAGNOSIS — M546 Pain in thoracic spine: Secondary | ICD-10-CM | POA: Insufficient documentation

## 2011-08-02 DIAGNOSIS — M5430 Sciatica, unspecified side: Secondary | ICD-10-CM

## 2011-08-02 DIAGNOSIS — E119 Type 2 diabetes mellitus without complications: Secondary | ICD-10-CM

## 2011-08-02 DIAGNOSIS — Z8719 Personal history of other diseases of the digestive system: Secondary | ICD-10-CM

## 2011-08-02 DIAGNOSIS — M5431 Sciatica, right side: Secondary | ICD-10-CM | POA: Insufficient documentation

## 2011-08-02 HISTORY — DX: Unspecified diastolic (congestive) heart failure: I50.30

## 2011-08-02 MED ORDER — HYDROCODONE-ACETAMINOPHEN 5-500 MG PO TABS: ORAL_TABLET | ORAL | Status: DC

## 2011-08-02 MED ORDER — NYSTATIN 100000 UNIT/ML MT SUSP: 500000.0000 [IU] | Freq: Four times a day (QID) | OROMUCOSAL | Status: AC

## 2011-08-02 NOTE — Patient Instructions (Addendum)
Take all new medications as prescribed - the hydrocodone Please go to XRAY in the Basement for the x-ray test Please go to LAB in the Basement for the blood and/or urine tests to be done You will be contacted by phone if any changes need to be made immediately.  Otherwise, you will receive a letter about your results with an explanation. Please also try the metamucil wafers - 1-2 per day

## 2011-08-03 DIAGNOSIS — B370 Candidal stomatitis: Secondary | ICD-10-CM | POA: Insufficient documentation

## 2011-08-03 NOTE — Assessment & Plan Note (Signed)
For UA next draw

## 2011-08-03 NOTE — Progress Notes (Signed)
Subjective:    Patient ID: Alicia Kent, female    DOB: [birth year], 78 y.o.   MRN: [id number]  HPI  Here with daughter to f/u, in wheelchair as she is mostly now, though she can stand and step up to exam table to turn and sit slowly with assist;  Dementia ongoing though pt able to communicate at times;  Daughter and pt confirms c/o worsening right LBP for 1 mo, seemed worse after initial PT so not clear how much PT helped?, was initially radiating to distal RLE for several days without weakness or falls but now mild, radiates to right buttock and lateral hip area but no bowel or bladder change, fever, wt loss,  worsening LE numbness/weakness or falls.  Worse back pain is actually to the mid and lower mid thoracic spine area today;  Has hx of mult falls prior to start of PT, mod to severe and tender to the area, constant, persistent, daughter only giving her half to 1 narcotic med at night as it makes her sleepy during the day even more than usual.  Also with episodes of loose stools/diarrhea and alternating constipation, as well intermittent spells of urinary incontinence and frequency but does not mention dysuria, hematuria, fever, flank pain, n/v,  Also incidentally with a mild rash to the tongue area. Pt denies chest pain, increased sob or doe, wheezing, orthopnea, PND, increased LE swelling, palpitations, dizziness or syncope.  Pt denies polydipsia, polyuria,.   Cont's to have generalized weakness and fatigue.  Past Medical History  Diagnosis Date  . Diabetes mellitus type II   . Hyperlipidemia   . HTN (hypertension)   . GERD (gastroesophageal reflux disease)   . Allergic rhinitis   . Depression   . Dementia     mild  . History of CVA (cerebrovascular accident)   . LBBB (left bundle branch block)   . AAA (abdominal aortic aneurysm)     small, last check in 05/2009, due for repeat in 05/2010  . Carotid stenosis 09/2009    40-59% RICA, 0-39% LICA  . LVH (left ventricular hypertrophy) 10/2009   Moderate, EF 55-60%, no regional wall motion abnormalities  . Shellfish allergy     anaphylaxis  . Osteopenia   . Vitamin d deficiency   . Fracture of rib of left side 12/2009  . Asthma 01/27/2011  . Diastolic CHF 08/02/2011   Past Surgical History  Procedure Date  . Ankle surgery     Right, by Ortho in [place]  . Tracheostomy 2010    anaphylactic episode on vent  . Tubal ligation     reports that she has been smoking.  She does not have any smokeless tobacco history on file. She reports that she does not drink alcohol or use illicit drugs. family history includes Diabetes type II in her other; Hypertension in her brother; Rheum arthritis in her sister; and Stroke in her brother. Allergies  Allergen Reactions  . Ibuprofen Anaphylaxis  . Shellfish-Derived Products Anaphylaxis    Swelling tongue and throat  . Ace Inhibitors Other (See Comments)    unknown   Current Outpatient Prescriptions on File Prior to Visit  Medication Sig Dispense Refill  . aspirin 81 MG EC tablet Take 81 mg by mouth daily.        . citalopram (CELEXA) 20 MG tablet Take 20 mg by mouth daily.        . citalopram (CELEXA) 20 MG tablet TAKE 1 TABLET BY MOUTH DAILY  90 tablet  0  .  cyclobenzaprine (FLEXERIL) 5 MG tablet Take 5 mg by mouth 3 (three) times daily as needed. For muscle spasms.      Marland Kitchen donepezil (ARICEPT) 10 MG tablet TAKE ONE TABLET BY MOUTH EVERY DAY  30 tablet  11  . famotidine (PEPCID) 40 MG tablet TAKE 1 TABLET BY MOUTH EVERY NIGHT AT BEDTIME  90 tablet  0  . feeding supplement (RESOURCE BREEZE) LIQD Take 1 Container by mouth daily after supper.      . fexofenadine (ALLEGRA) 180 MG tablet Take 1 tablet (180 mg total) by mouth daily.  30 tablet  11  . fluticasone (FLONASE) 50 MCG/ACT nasal spray Place 2 sprays into the nose daily as needed. For allergies.      Marland Kitchen insulin glargine (LANTUS) 100 UNIT/ML injection Inject 45 Units into the skin at bedtime.  10 mL  4  . labetalol (NORMODYNE) 100 MG tablet  Take 1 tablet (100 mg total) by mouth 2 (two) times daily.      . memantine (NAMENDA) 5 MG tablet Take 5 mg by mouth 2 (two) times daily.      . metFORMIN (GLUCOPHAGE) 500 MG tablet Take 1,500 mg by mouth daily with breakfast.      . montelukast (SINGULAIR) 10 MG tablet Take 10 mg by mouth at bedtime.      . Multiple Vitamin (MULITIVITAMIN WITH MINERALS) TABS Take 1 tablet by mouth daily.        Marland Kitchen NIFEDICAL XL 30 MG 24 hr tablet TAKE ONE TABLET BY MOUTH DAILY  90 tablet  1  . nystatin (MYCOSTATIN) powder Apply 100,000 g topically 2 (two) times daily as needed. For yeast infection between breasts.      . pravastatin (PRAVACHOL) 80 MG tablet Take 80 mg by mouth daily.      . risperiDONE (RISPERDAL) 0.5 MG tablet 1-2 tabs by mouth twice per day  120 tablet  11   Review of Systems Review of Systems  Constitutional: Negative for diaphoresis and unexpected weight change.  HENT: Negative for drooling and tinnitus.   Eyes: Negative for photophobia and visual disturbance.  Respiratory: Negative for choking and stridor.   Gastrointestinal: Negative for vomiting and blood in stool.  Genitourinary: Negative for hematuria and decreased urine volume. .  Skin: Negative for color change and wound.  Neurological: Negative for tremors and numbness.  Psychiatric/Behavioral; no worsening agitation or apparent hallucinations    Objective:   Physical Exam BP 118/66  Pulse 69  Temp(Src) 98.1 F (36.7 C) (Oral)  Wt 162 lb (73.483 kg)  SpO2 96% Physical Exam  VS noted Constitutional: Pt appears well-developed and well-nourished.  HENT: Head: Normocephalic.  Right Ear: External ear normal.  Left Ear: External ear normal.  + mild redish/white thrush noted to dorsal tongue Eyes: Conjunctivae and EOM are normal. Pupils are equal, round, and reactive to light.  Neck: Normal range of motion. Neck supple.  Cardiovascular: Normal rate and regular rhythm.   Pulmonary/Chest: Effort normal and breath sounds  decreased, no rales or wheeze.  Abd:  Soft, NT, non-distended, + BS, no flank tender Neurological: Pt is alert. Motor/dtr intact to LE's Spine:  Moderate tender to t-spine in midline approx t8,  No lumbar tender but has mild right lumbar paravertebral tender Skin: Skin is warm. No erythema.  Psychiatric: Pt behavior non agitated, mild nervous, not depressed affect    Assessment & Plan:

## 2011-08-03 NOTE — Assessment & Plan Note (Signed)
Exam benign, ok to try metamucil wafers for most recent loose stools,  to f/u any worsening symptoms or concerns

## 2011-08-03 NOTE — Assessment & Plan Note (Signed)
stable overall by hx and exam, most recent data reviewed with pt, and pt to continue medical treatment as before  Lab Results  Component Value Date   WBC 10.4 06/04/2011   HGB 13.3 06/04/2011   HCT 39.0 06/04/2011   PLT 445* 06/04/2011   GLUCOSE 64* 06/04/2011   CHOL 109 05/20/2011   TRIG 71 05/20/2011   HDL 42 05/20/2011   LDLCALC 53 05/20/2011   ALT 7 05/20/2011   AST 10 05/20/2011   NA 141 06/04/2011   K 4.0 06/04/2011   CL 104 06/04/2011   CREATININE 0.70 06/04/2011   BUN 7 06/04/2011   CO2 31 05/23/2011   TSH 0.910 05/20/2011   INR 1.30 05/20/2011   HGBA1C 6.4* 05/20/2011   MICROALBUR 1.5 09/19/2010

## 2011-08-03 NOTE — Assessment & Plan Note (Signed)
stable overall by hx and exam, most recent data reviewed with pt, and pt to continue medical treatment as before Lab Results  Component Value Date   HGBA1C 6.4* 05/20/2011

## 2011-08-03 NOTE — Assessment & Plan Note (Signed)
Has not fallen essentially for 2 mo, but has signficiant t-spine tenderness today , may not be related, but to check films,  to f/u any worsening symptoms or concerns

## 2011-08-03 NOTE — Assessment & Plan Note (Addendum)
I think PT has helped, though right LBP exac may have been related, not clear, Continue all other medications as before , for labs next draw,  to f/u any worsening symptoms or concerns  Note:  Total time for pt hx, exam, review of record with pt and daughter in room, determination of diagnosis and plan for further eval and tx is > 40 min

## 2011-08-03 NOTE — Assessment & Plan Note (Signed)
Ongoing, mild to mod, for pain control,  to f/u any worsening symptoms or concerns, daughter declines ortho eval for now

## 2011-08-03 NOTE — Assessment & Plan Note (Signed)
Mild, for nystatin asd,  to f/u any worsening symptoms or concerns 

## 2011-08-05 ENCOUNTER — Ambulatory Visit (INDEPENDENT_AMBULATORY_CARE_PROVIDER_SITE_OTHER)
Admission: RE | Admit: 2011-08-05 | Discharge: 2011-08-05 | Disposition: A | Discharge status: Home / Self Care | Payer: Medicare PPO | Source: Ambulatory Visit | Attending: Internal Medicine | Admitting: Internal Medicine

## 2011-08-05 ENCOUNTER — Other Ambulatory Visit (INDEPENDENT_AMBULATORY_CARE_PROVIDER_SITE_OTHER): Payer: Medicare PPO

## 2011-08-05 DIAGNOSIS — M5431 Sciatica, right side: Secondary | ICD-10-CM

## 2011-08-05 DIAGNOSIS — M5430 Sciatica, unspecified side: Secondary | ICD-10-CM

## 2011-08-05 DIAGNOSIS — R531 Weakness: Secondary | ICD-10-CM

## 2011-08-05 DIAGNOSIS — R5383 Other fatigue: Secondary | ICD-10-CM

## 2011-08-05 LAB — HEPATIC FUNCTION PANEL
AST: 14 U/L (ref 0–37)
Alkaline Phosphatase: 70 U/L (ref 39–117)
Bilirubin, Direct: 0.1 mg/dL (ref 0.0–0.3)
Total Protein: 7.6 g/dL (ref 6.0–8.3)

## 2011-08-05 LAB — CBC WITH DIFFERENTIAL/PLATELET
Basophils Absolute: 0 10*3/uL (ref 0.0–0.1)
Eosinophils Absolute: 0.2 10*3/uL (ref 0.0–0.7)
Eosinophils Relative: 1.8 % (ref 0.0–5.0)
HCT: 34.7 % — ABNORMAL LOW (ref 36.0–46.0)
Lymphs Abs: 1.5 10*3/uL (ref 0.7–4.0)
MCHC: 32.7 g/dL (ref 30.0–36.0)
MCV: 84.9 fl (ref 78.0–100.0)
Monocytes Absolute: 0.6 10*3/uL (ref 0.1–1.0)
Neutrophils Relative %: 76.5 % (ref 43.0–77.0)
Platelets: 447 10*3/uL — ABNORMAL HIGH (ref 150.0–400.0)
RDW: 15.6 % — ABNORMAL HIGH (ref 11.5–14.6)

## 2011-08-05 LAB — BASIC METABOLIC PANEL
BUN: 11 mg/dL (ref 6–23)
CO2: 26 mEq/L (ref 19–32)
Calcium: 8.9 mg/dL (ref 8.4–10.5)
Chloride: 101 mEq/L (ref 96–112)
Creatinine, Ser: 0.7 mg/dL (ref 0.4–1.2)

## 2011-08-05 LAB — URINALYSIS, ROUTINE W REFLEX MICROSCOPIC
Hgb urine dipstick: NEGATIVE
Nitrite: NEGATIVE
Total Protein, Urine: 30
Urobilinogen, UA: 0.2 (ref 0.0–1.0)

## 2011-08-06 ENCOUNTER — Encounter: Payer: Self-pay | Admitting: Internal Medicine

## 2011-08-06 ENCOUNTER — Telehealth: Payer: Self-pay

## 2011-08-06 DIAGNOSIS — S22080A Wedge compression fracture of T11-T12 vertebra, initial encounter for closed fracture: Secondary | ICD-10-CM

## 2011-08-06 NOTE — Telephone Encounter (Signed)
Message copied by Pincus Sanes on Tue Aug 06, 2011  9:53 AM ------      Message from: Corwin Levins      Created: Mon Aug 05, 2011  6:00 PM      Regarding: ? t-spine film result       I dont see the t-spine xray results, I think should be back by now            Please call radiology, maybe result just wasn't forwarded

## 2011-08-06 NOTE — Telephone Encounter (Signed)
Called radiology and they stated they could not see the order for the T spine at the time and have tried to call back the patient to return but have been unable to contact.  I informed radiology to call [name] the patients daughter who takes care of the patient.

## 2011-08-06 NOTE — Telephone Encounter (Signed)
I reviewed the film again;  The LS spine film actually showed new fx at T12  We can call daughter to inform ok to hold off on the t-spine plain film   We will order T-spine MRI instead, and refer to [facility] for consideration for kyphoplasty/vertebroplasty

## 2011-08-06 NOTE — Telephone Encounter (Signed)
Patients daughter informed

## 2011-08-07 ENCOUNTER — Telehealth: Payer: Self-pay

## 2011-08-07 NOTE — Telephone Encounter (Signed)
Ok for verbal 

## 2011-08-07 NOTE — Telephone Encounter (Signed)
[facility]) called to inform PT and nursing care have expired.  [facility] would like a verbal to continue PT and nursing care for this patient.  Call back number is [phone]

## 2011-08-08 NOTE — Telephone Encounter (Signed)
Called [facility] informed of MD's verbal ok. 

## 2011-08-10 ENCOUNTER — Ambulatory Visit
Admission: RE | Admit: 2011-08-10 | Discharge: 2011-08-10 | Disposition: A | Discharge status: Home / Self Care | Payer: Medicare PPO | Source: Ambulatory Visit | Attending: Internal Medicine | Admitting: Internal Medicine

## 2011-08-10 DIAGNOSIS — S22080A Wedge compression fracture of T11-T12 vertebra, initial encounter for closed fracture: Secondary | ICD-10-CM

## 2011-08-13 ENCOUNTER — Encounter: Payer: Self-pay | Admitting: Internal Medicine

## 2011-08-21 DIAGNOSIS — F0390 Unspecified dementia without behavioral disturbance: Secondary | ICD-10-CM

## 2011-08-21 DIAGNOSIS — M8448XD Pathological fracture, other site, subsequent encounter for fracture with routine healing: Secondary | ICD-10-CM

## 2011-08-21 DIAGNOSIS — I509 Heart failure, unspecified: Secondary | ICD-10-CM

## 2011-08-21 DIAGNOSIS — E119 Type 2 diabetes mellitus without complications: Secondary | ICD-10-CM

## 2011-08-29 ENCOUNTER — Other Ambulatory Visit: Payer: Self-pay | Admitting: Internal Medicine

## 2011-10-04 ENCOUNTER — Telehealth: Payer: Self-pay

## 2011-10-04 NOTE — Telephone Encounter (Signed)
[facility] (Dr. Merril Abbe office) called stating the patient has had no osteoporosis treatment since June.  Do you want to restart, please advise.  Contact number is [phone] Larita Fife).

## 2011-10-05 MED ORDER — ALENDRONATE SODIUM 70 MG PO TABS: 70.0000 mg | ORAL_TABLET | ORAL | Status: DC

## 2011-10-05 NOTE — Telephone Encounter (Signed)
Robin to contact pt family - ok for fosamax 70 q wk if ok with family, with max tx time to be no more than 5 yrs

## 2011-10-07 NOTE — Telephone Encounter (Signed)
Patients daughter [name] informed of MD instructions on medication.

## 2011-10-08 ENCOUNTER — Other Ambulatory Visit: Payer: Self-pay | Admitting: Internal Medicine

## 2011-10-17 ENCOUNTER — Ambulatory Visit: Payer: Medicare PPO | Admitting: Internal Medicine

## 2011-11-05 ENCOUNTER — Ambulatory Visit (INDEPENDENT_AMBULATORY_CARE_PROVIDER_SITE_OTHER): Payer: Medicare PPO | Admitting: Internal Medicine

## 2011-11-05 ENCOUNTER — Encounter: Payer: Self-pay | Admitting: Internal Medicine

## 2011-11-05 ENCOUNTER — Other Ambulatory Visit (INDEPENDENT_AMBULATORY_CARE_PROVIDER_SITE_OTHER): Payer: Medicare PPO

## 2011-11-05 VITALS — BP 124/80 | HR 70 | Temp 97.3°F | Ht 65.0 in | Wt 154.0 lb

## 2011-11-05 DIAGNOSIS — E119 Type 2 diabetes mellitus without complications: Secondary | ICD-10-CM

## 2011-11-05 DIAGNOSIS — Z0000 Encounter for general adult medical examination without abnormal findings: Secondary | ICD-10-CM

## 2011-11-05 DIAGNOSIS — L03031 Cellulitis of right toe: Secondary | ICD-10-CM

## 2011-11-05 DIAGNOSIS — E785 Hyperlipidemia, unspecified: Secondary | ICD-10-CM

## 2011-11-05 DIAGNOSIS — I10 Essential (primary) hypertension: Secondary | ICD-10-CM

## 2011-11-05 DIAGNOSIS — L02619 Cutaneous abscess of unspecified foot: Secondary | ICD-10-CM

## 2011-11-05 DIAGNOSIS — Z79899 Other long term (current) drug therapy: Secondary | ICD-10-CM

## 2011-11-05 LAB — BASIC METABOLIC PANEL
CO2: 26 mEq/L (ref 19–32)
Chloride: 104 mEq/L (ref 96–112)
GFR: 59.53 mL/min — ABNORMAL LOW (ref >60.00)
Glucose, Bld: 146 mg/dL — ABNORMAL HIGH (ref 70–99)
Potassium: 5.6 mEq/L — ABNORMAL HIGH (ref 3.5–5.1)
Sodium: 140 mEq/L (ref 135–145)

## 2011-11-05 LAB — CBC WITH DIFFERENTIAL/PLATELET
Basophils Absolute: 0.1 10*3/uL (ref 0.0–0.1)
HCT: 36.8 % (ref 36.0–46.0)
Lymphs Abs: 2 10*3/uL (ref 0.7–4.0)
Monocytes Relative: 13 % — ABNORMAL HIGH (ref 3.0–12.0)
Platelets: 361 10*3/uL (ref 150.0–400.0)
RDW: 15.6 % — ABNORMAL HIGH (ref 11.5–14.6)

## 2011-11-05 LAB — URINALYSIS, ROUTINE W REFLEX MICROSCOPIC
Bilirubin Urine: NEGATIVE
Ketones, ur: NEGATIVE
Leukocytes, UA: NEGATIVE
Specific Gravity, Urine: >=1.03 (ref 1.000–1.030)
Urobilinogen, UA: 0.2 (ref 0.0–1.0)

## 2011-11-05 LAB — TSH: TSH: 1.75 u[IU]/mL (ref 0.35–5.50)

## 2011-11-05 LAB — HEMOGLOBIN A1C: Hgb A1c MFr Bld: 5.6 % (ref 4.6–6.5)

## 2011-11-05 LAB — MICROALBUMIN / CREATININE URINE RATIO
Creatinine,U: 278.1 mg/dL
Microalb Creat Ratio: 0.8 mg/g (ref 0.0–30.0)
Microalb, Ur: 2.1 mg/dL — ABNORMAL HIGH (ref 0.0–1.9)

## 2011-11-05 LAB — HEPATIC FUNCTION PANEL
ALT: 13 U/L (ref 0–35)
AST: 23 U/L (ref 0–37)
Albumin: 3.5 g/dL (ref 3.5–5.2)
Alkaline Phosphatase: 70 U/L (ref 39–117)
Total Protein: 7.6 g/dL (ref 6.0–8.3)

## 2011-11-05 MED ORDER — CEPHALEXIN 500 MG PO CAPS: 500.0000 mg | ORAL_CAPSULE | Freq: Four times a day (QID) | ORAL | Status: AC

## 2011-11-05 NOTE — Assessment & Plan Note (Signed)
stable overall by hx and exam, most recent data reviewed with pt, and pt to continue medical treatment as before Lab Results  Component Value Date   HGBA1C 5.6 11/05/2011    

## 2011-11-05 NOTE — Patient Instructions (Addendum)
Take all new medications as prescribed - the new antibiotic Please do not take the other sulfa antibiotic further, and this will be listed on your chart as an "allergy" so you are not prescribed this again in the future Continue all other medications as before Please continue the daily dressing changes with neosporin for at least one more week or until healed Please go to LAB in the [facility] for the blood and/or urine tests to be done today (so you dont have to make a special trip back for blood work before your next appointment Sept 20 You will be contacted by phone if any changes need to be made immediately.  Otherwise, you will receive a letter about your results with an explanation. Please return in Sept 20 as planned

## 2011-11-05 NOTE — Assessment & Plan Note (Signed)
stable overall by hx and exam, most recent data reviewed with pt, and pt to continue medical treatment as before Lab Results  Component Value Date   LDLCALC 62 11/05/2011

## 2011-11-05 NOTE — Progress Notes (Signed)
Subjective:    Patient ID: Azra Abrell, female    DOB: 1930/09/28, 78 y.o.   MRN: [id number]  HPI  Here with daughter, pt unfortunately struck the end of the right great toe on her walker, with dislodgement of entire nail, with marked pain and some bleeding, seen at urgent care, bleeding stopped, dressed with neosporin with instruction to change daily, and f/u here; also given septra ds for prophylaxis but unfortuantely pt had n/v so only took 2 or 3 doses;  Great toe about the nail taken off is now mild increased red, tender, swollen, without fluctuance or drainage.   Pt denies fever, wt loss, night sweats, loss of appetite, or other constitutional symptoms   Pt denies polydipsia, polyuria.  Pt denies new neurological symptoms such as new headache, or facial or extremity weakness or numbness Past Medical History  Diagnosis Date  . Diabetes mellitus type II   . Hyperlipidemia   . HTN (hypertension)   . GERD (gastroesophageal reflux disease)   . Allergic rhinitis   . Depression   . Dementia     mild  . History of CVA (cerebrovascular accident)   . LBBB (left bundle branch block)   . AAA (abdominal aortic aneurysm)     small, last check in 05/2009, due for repeat in 05/2010  . Carotid stenosis 09/2009    40-59% RICA, 0-39% LICA  . LVH (left ventricular hypertrophy) 10/2009    Moderate, EF 55-60%, no regional wall motion abnormalities  . Shellfish allergy     anaphylaxis  . Osteopenia   . Vitamin d deficiency   . Fracture of rib of left side 12/2009  . Asthma 01/27/2011  . Diastolic CHF 08/02/2011   Past Surgical History  Procedure Date  . Ankle surgery     Right, by Ortho in [place]  . Tracheostomy 2010    anaphylactic episode on vent  . Tubal ligation     reports that she has been smoking.  She does not have any smokeless tobacco history on file. She reports that she does not drink alcohol or use illicit drugs. family history includes Diabetes type II in her other; Hypertension in  her brother; Rheum arthritis in her sister; and Stroke in her brother. Allergies  Allergen Reactions  . Ibuprofen Anaphylaxis  . Shellfish-Derived Products Anaphylaxis    Swelling tongue and throat  . Ace Inhibitors Other (See Comments)    unknown  . Sulfa Antibiotics Nausea And Vomiting   Current Outpatient Prescriptions on File Prior to Visit  Medication Sig Dispense Refill  . alendronate (FOSAMAX) 70 MG tablet Take 1 tablet (70 mg total) by mouth every 7 (seven) days. Take with a full glass of water on an empty stomach.  12 tablet  3  . aspirin 81 MG EC tablet Take 81 mg by mouth daily.        . citalopram (CELEXA) 20 MG tablet Take 20 mg by mouth daily.        . citalopram (CELEXA) 20 MG tablet TAKE 1 TABLET BY MOUTH DAILY  90 tablet  0  . cyclobenzaprine (FLEXERIL) 5 MG tablet Take 5 mg by mouth 3 (three) times daily as needed. For muscle spasms.      Marland Kitchen donepezil (ARICEPT) 10 MG tablet TAKE ONE TABLET BY MOUTH EVERY DAY  30 tablet  11  . famotidine (PEPCID) 40 MG tablet TAKE 1 TABLET BY MOUTH EVERY NIGHT AT BEDTIME  90 tablet  0  . feeding supplement (RESOURCE BREEZE)  LIQD Take 1 Container by mouth daily after supper.      . fexofenadine (ALLEGRA) 180 MG tablet Take 1 tablet (180 mg total) by mouth daily.  30 tablet  11  . fluticasone (FLONASE) 50 MCG/ACT nasal spray Place 2 sprays into the nose daily as needed. For allergies.      Marland Kitchen HYDROcodone-acetaminophen (VICODIN) 5-500 MG per tablet 1/2 -1 tab by mouth three times per day as needed for pain  90 tablet  1  . insulin glargine (LANTUS) 100 UNIT/ML injection Inject 45 Units into the skin at bedtime.  10 mL  4  . labetalol (NORMODYNE) 100 MG tablet Take 1 tablet (100 mg total) by mouth 2 (two) times daily.      Marland Kitchen labetalol (NORMODYNE) 200 MG tablet TAKE 1 TABLET BY MOUTH TWICE DAILY  60 tablet  5  . memantine (NAMENDA) 5 MG tablet Take 5 mg by mouth 2 (two) times daily.      . metFORMIN (GLUCOPHAGE) 500 MG tablet Take 1,500 mg by  mouth daily with breakfast.      . montelukast (SINGULAIR) 10 MG tablet Take 10 mg by mouth at bedtime.      . Multiple Vitamin (MULITIVITAMIN WITH MINERALS) TABS Take 1 tablet by mouth daily.        Marland Kitchen NIFEDICAL XL 30 MG 24 hr tablet TAKE ONE TABLET BY MOUTH DAILY  90 tablet  1  . nystatin (MYCOSTATIN) powder Apply 100,000 g topically 2 (two) times daily as needed. For yeast infection between breasts.      . pravastatin (PRAVACHOL) 80 MG tablet Take 80 mg by mouth daily.      . risperiDONE (RISPERDAL) 0.5 MG tablet 1-2 tabs by mouth twice per day  120 tablet  11  . simvastatin (ZOCOR) 40 MG tablet TAKE 1 TABLET BY MOUTH DAILY  90 tablet  3   Review of Systems Constitutional: Negative for diaphoresis and unexpected weight change.  HENT: Negative for tinnitus.   Eyes: Negative for photophobia and visual disturbance.  Gastrointestinal: Negative for vomiting and blood in stool.  Genitourinary: Negative for hematuria and decreased urine volume.  Musculoskeletal: Negative for gait problem.   Neurological: Negative for tremors and numbness.  Psychiatric/Behavioral: Negative for decreased concentration. The patient is not hyperactive.      Objective:   Physical Exam BP 124/80  Pulse 70  Temp 97.3 F (36.3 C) (Oral)  Ht 5\' 5"  (1.651 m)  Wt 154 lb (69.854 kg)  BMI 25.63 kg/m2  SpO2 94% Physical Exam  VS noted Constitutional: Pt appears well-developed and well-nourished.  HENT: Head: Normocephalic.  Right Ear: External ear normal.  Left Ear: External ear normal.  Eyes: Conjunctivae and EOM are normal. Pupils are equal, round, and reactive to light.  Neck: Normal range of motion. Neck supple.  Cardiovascular: Normal rate and regular rhythm.   Pulmonary/Chest: Effort normal and breath sounds normal.  Neurological: Pt is alert Skin: right great toe with 1+ distal mild red/tender/swelling, nail is removed, no fluctuance, or drainage; no red streaks or ulcers Psychiatric: Pt behavior is  normal.     Assessment & Plan:

## 2011-11-05 NOTE — Assessment & Plan Note (Signed)
stable overall by hx and exam, most recent data reviewed with pt, and pt to continue medical treatment as before BP Readings from Last 3 Encounters:  11/05/11 124/80  08/02/11 118/66  07/12/11 142/80

## 2011-11-05 NOTE — Assessment & Plan Note (Signed)
Mild to mod, for antibx course,  to f/u any worsening symptoms or concerns, unable to tolerate the septra - to put on allergy list

## 2011-11-06 ENCOUNTER — Telehealth: Payer: Self-pay

## 2011-11-06 NOTE — Telephone Encounter (Signed)
Adjust medication list per MD instructions.  Decreased Lantus from 45 units to 40 units per day.

## 2011-11-22 ENCOUNTER — Ambulatory Visit: Payer: Medicare PPO | Admitting: Internal Medicine

## 2011-12-23 ENCOUNTER — Other Ambulatory Visit: Payer: Self-pay

## 2011-12-23 MED ORDER — FAMOTIDINE 40 MG PO TABS: 40.0000 mg | ORAL_TABLET | Freq: Every day | ORAL | Status: DC

## 2012-01-01 ENCOUNTER — Other Ambulatory Visit: Payer: Self-pay

## 2012-01-01 MED ORDER — INSULIN GLARGINE 100 UNIT/ML ~~LOC~~ SOLN: 40.0000 [IU] | Freq: Every day | SUBCUTANEOUS | Status: DC

## 2012-01-15 ENCOUNTER — Other Ambulatory Visit: Payer: Self-pay | Admitting: *Deleted

## 2012-01-15 MED ORDER — NIFEDIPINE ER OSMOTIC RELEASE 30 MG PO TB24: ORAL_TABLET | ORAL | Status: DC

## 2012-01-15 NOTE — Telephone Encounter (Signed)
R'cd fax from [facility] for refill of Nifedical.

## 2012-01-26 ENCOUNTER — Other Ambulatory Visit: Payer: Self-pay | Admitting: Internal Medicine

## 2012-01-27 NOTE — Telephone Encounter (Signed)
Done per emr 

## 2012-01-31 ENCOUNTER — Other Ambulatory Visit: Payer: Self-pay | Admitting: Internal Medicine

## 2012-02-03 NOTE — Telephone Encounter (Signed)
Done hardcopy to robin  

## 2012-02-03 NOTE — Telephone Encounter (Signed)
Faxed hardcopy to pharmacy. 

## 2012-02-16 ENCOUNTER — Other Ambulatory Visit: Payer: Self-pay | Admitting: Internal Medicine

## 2012-03-11 ENCOUNTER — Other Ambulatory Visit: Payer: Self-pay

## 2012-03-11 MED ORDER — INSULIN GLARGINE 100 UNIT/ML ~~LOC~~ SOLN: 40.0000 [IU] | Freq: Every day | SUBCUTANEOUS | Status: DC

## 2012-03-11 MED ORDER — CITALOPRAM HYDROBROMIDE 20 MG PO TABS: 20.0000 mg | ORAL_TABLET | Freq: Every day | ORAL | Status: DC

## 2012-03-11 MED ORDER — NIFEDIPINE ER OSMOTIC RELEASE 30 MG PO TB24: ORAL_TABLET | ORAL | Status: DC

## 2012-03-11 MED ORDER — MONTELUKAST SODIUM 10 MG PO TABS: 10.0000 mg | ORAL_TABLET | Freq: Every day | ORAL | Status: DC

## 2012-03-12 ENCOUNTER — Other Ambulatory Visit: Payer: Self-pay

## 2012-03-12 MED ORDER — INSULIN PEN NEEDLE 31G X 5 MM MISC: Status: DC

## 2012-04-06 ENCOUNTER — Ambulatory Visit (INDEPENDENT_AMBULATORY_CARE_PROVIDER_SITE_OTHER): Payer: Medicare Other | Admitting: Internal Medicine

## 2012-04-06 ENCOUNTER — Encounter: Payer: Self-pay | Admitting: Internal Medicine

## 2012-04-06 VITALS — BP 120/68 | HR 66 | Temp 97.4°F | Ht 66.0 in | Wt 175.0 lb

## 2012-04-06 DIAGNOSIS — J209 Acute bronchitis, unspecified: Secondary | ICD-10-CM | POA: Insufficient documentation

## 2012-04-06 DIAGNOSIS — E119 Type 2 diabetes mellitus without complications: Secondary | ICD-10-CM

## 2012-04-06 DIAGNOSIS — F068 Other specified mental disorders due to known physiological condition: Secondary | ICD-10-CM

## 2012-04-06 DIAGNOSIS — I509 Heart failure, unspecified: Secondary | ICD-10-CM

## 2012-04-06 DIAGNOSIS — Z23 Encounter for immunization: Secondary | ICD-10-CM

## 2012-04-06 DIAGNOSIS — I5030 Unspecified diastolic (congestive) heart failure: Secondary | ICD-10-CM

## 2012-04-06 DIAGNOSIS — J45909 Unspecified asthma, uncomplicated: Secondary | ICD-10-CM

## 2012-04-06 DIAGNOSIS — Z0000 Encounter for general adult medical examination without abnormal findings: Secondary | ICD-10-CM

## 2012-04-06 MED ORDER — AZITHROMYCIN 250 MG PO TABS: ORAL_TABLET | ORAL | Status: DC

## 2012-04-06 MED ORDER — BENZONATATE 100 MG PO CAPS: ORAL_CAPSULE | ORAL | Status: DC

## 2012-04-06 MED ORDER — RISPERIDONE 1 MG PO TABS: 1.0000 mg | ORAL_TABLET | Freq: Two times a day (BID) | ORAL | Status: DC

## 2012-04-06 NOTE — Assessment & Plan Note (Signed)
Mild to mod, for antibx course,  to f/u any worsening symptoms or concerns 

## 2012-04-06 NOTE — Assessment & Plan Note (Signed)
With worsening behavioral symptoms, near end -stage dementia, cont meds and increase risperdal to 1 mg bid, watch for increased sedation

## 2012-04-06 NOTE — Assessment & Plan Note (Signed)
stable overall by history and exam, recent data reviewed with pt, and pt to continue medical treatment as before,  to f/u any worsening symptoms or concerns Lab Results  Component Value Date   HGBA1C 5.6 11/05/2011

## 2012-04-06 NOTE — Patient Instructions (Addendum)
You had the flu shot today Please take all new medication as prescribed - the antibiotic, and pills for cough Please continue all other medications as before Please have the pharmacy call with any other refills you may need. You will be contacted regarding the referral for: [facility] ([facility]), and [facility] Please remember to sign up for My Chart if you have not done so, as this will be important to you in the future with finding out test results, communicating by private email, and scheduling acute appointments online when needed. I think we can hold off on a CXR today, or lab work Please return in 3 months, or sooner if needed, with Lab testing done 3-5 days before

## 2012-04-06 NOTE — Progress Notes (Signed)
Subjective:    Patient ID: Alicia Kent, female    DOB: 06-17-1930, 79 y.o.   MRN: [id number]  HPI  Here with acute onset mild to mod 2-3 days ST, HA, general weakness and malaise, with prod cough greenish sputum, but Pt denies chest pain, increased sob or doe, wheezing, orthopnea, PND, increased LE swelling, palpitations, dizziness or syncope. Also with back pain last 2 days to mid back with coughing, has hx of thoracic spine fx 2013, followed by ortho in [facility]. Pt denies new neurological symptoms such as new headache, or facial or extremity weakness or numbness   Pt denies polydipsia, polyuria.  Has hx of CHF, used to see [facility], was getting PT, still uses walker.  Also neds [facility]., Due for flu shot today. Dementia overall stable symptomatically with mild worsening including hallucination at night with children and dead family, and sometimes with behavioral changes such as paranoia and agitation, even before the cough.  symtpoms have been ongoing for yrs, but worse now lasting days at a time. Past Medical History  Diagnosis Date  . Diabetes mellitus type II   . Hyperlipidemia   . HTN (hypertension)   . GERD (gastroesophageal reflux disease)   . Allergic rhinitis   . Depression   . Dementia     mild  . History of CVA (cerebrovascular accident)   . LBBB (left bundle branch block)   . AAA (abdominal aortic aneurysm)     small, last check in 05/2009, due for repeat in 05/2010  . Carotid stenosis 09/2009    40-59% RICA, 0-39% LICA  . LVH (left ventricular hypertrophy) 10/2009    Moderate, EF 55-60%, no regional wall motion abnormalities  . Shellfish allergy     anaphylaxis  . Osteopenia   . Vitamin D deficiency   . Fracture of rib of left side 12/2009  . Asthma 01/27/2011  . Diastolic CHF 08/02/2011   Past Surgical History  Procedure Date  . Ankle surgery     Right, by Ortho in [place]  . Tracheostomy 2010    anaphylactic episode on vent  . Tubal ligation     reports that  she has been smoking.  She does not have any smokeless tobacco history on file. She reports that she does not drink alcohol or use illicit drugs. family history includes Diabetes type II in her other; Hypertension in her brother; Rheum arthritis in her sister; and Stroke in her brother. Allergies  Allergen Reactions  . Ibuprofen Anaphylaxis  . Shellfish-Derived Products Anaphylaxis    Swelling tongue and throat  . Ace Inhibitors Other (See Comments)    unknown  . Sulfa Antibiotics Nausea And Vomiting   Current Outpatient Prescriptions on File Prior to Visit  Medication Sig Dispense Refill  . alendronate (FOSAMAX) 70 MG tablet Take 1 tablet (70 mg total) by mouth every 7 (seven) days. Take with a full glass of water on an empty stomach.  12 tablet  3  . aspirin 81 MG EC tablet Take 81 mg by mouth daily.        . citalopram (CELEXA) 20 MG tablet TAKE 1 TABLET BY MOUTH DAILY  90 tablet  0  . citalopram (CELEXA) 20 MG tablet Take 1 tablet (20 mg total) by mouth daily.  30 tablet  11  . cyclobenzaprine (FLEXERIL) 5 MG tablet Take 5 mg by mouth 3 (three) times daily as needed. For muscle spasms.      Marland Kitchen donepezil (ARICEPT) 10 MG tablet  TAKE ONE TABLET BY MOUTH EVERY DAY  30 tablet  11  . famotidine (PEPCID) 40 MG tablet Take 1 tablet (40 mg total) by mouth daily.  90 tablet  3  . feeding supplement (RESOURCE BREEZE) LIQD Take 1 Container by mouth daily after supper.      . fexofenadine (ALLEGRA) 180 MG tablet Take 1 tablet (180 mg total) by mouth daily.  30 tablet  11  . fluticasone (FLONASE) 50 MCG/ACT nasal spray Place 2 sprays into the nose daily as needed. For allergies.      Marland Kitchen HYDROcodone-acetaminophen (VICODIN) 5-500 MG per tablet TAKE 1/2 TO 1 TABLET BY MOUTH THREE TIMES DAILY AS NEEDED FOR PAIN  90 tablet  1  . insulin glargine (LANTUS) 100 UNIT/ML injection Inject 40 Units into the skin at bedtime.  10 mL  11  . Insulin Pen Needle (B-D UF III MINI PEN NEEDLES) 31G X 5 MM MISC Use as  directed once daily to check blood sugar.  Diagnosis code 250.00  100 each  11  . labetalol (NORMODYNE) 100 MG tablet Take 1 tablet (100 mg total) by mouth 2 (two) times daily.      Marland Kitchen labetalol (NORMODYNE) 200 MG tablet TAKE 1 TABLET BY MOUTH TWICE DAILY  60 tablet  5  . memantine (NAMENDA) 5 MG tablet Take 5 mg by mouth 2 (two) times daily.      . metFORMIN (GLUCOPHAGE) 500 MG tablet Take 1,500 mg by mouth daily with breakfast.      . montelukast (SINGULAIR) 10 MG tablet Take 1 tablet (10 mg total) by mouth at bedtime.  30 tablet  11  . Multiple Vitamin (MULITIVITAMIN WITH MINERALS) TABS Take 1 tablet by mouth daily.        Marland Kitchen NAMENDA 5 MG tablet TAKE 1 TABLET BY MOUTH TWICE DAILY  180 tablet  3  . NIFEdipine (NIFEDICAL XL) 30 MG 24 hr tablet TAKE ONE TABLET BY MOUTH DAILY  90 tablet  1  . nystatin (MYCOSTATIN) powder Apply 100,000 g topically 2 (two) times daily as needed. For yeast infection between breasts.      . pravastatin (PRAVACHOL) 80 MG tablet Take 80 mg by mouth daily.      . simvastatin (ZOCOR) 40 MG tablet TAKE 1 TABLET BY MOUTH DAILY  90 tablet  3  . risperiDONE (RISPERDAL) 1 MG tablet Take 1 tablet (1 mg total) by mouth 2 (two) times daily.  180 tablet  3   Review of Systems Unable due to dementia, hx per daughter    Objective:   Physical Exam BP 120/68  Pulse 66  Temp 97.4 F (36.3 C) (Oral)  Ht 5\' 6"  (1.676 m)  Wt 175 lb (79.379 kg)  BMI 28.25 kg/m2  SpO2 97% VS noted, mild ill appearing Constitutional: Pt appears well-developed and well-nourished.  HENT: Head: NCAT.  Right Ear: External ear normal.  Left Ear: External ear normal.  Bilat tm's with mild erythema.  Max sinus areas mild tender.  Pharynx with mild erythema, no exudate Eyes: Conjunctivae and EOM are normal. Pupils are equal, round, and reactive to light.  Neck: Normal range of motion. Neck supple.  Cardiovascular: Normal rate and regular rhythm.   Pulmonary/Chest: Effort normal and breath sounds normal.   Neurological: Pt is alert. Not confused , motor/dtr intact, in wheelchair for exam today Skin: Skin is warm. No erythema. No rash. Has 1+ bilat pedal edema Psychiatric: Pt behavior is normal. Thought content c/w severe dementia, but pleasant,  follows simple commands     Assessment & Plan:

## 2012-04-06 NOTE — Assessment & Plan Note (Signed)
With mild worsening pedal edema, Please continue all other medications as before, and refills have been done if requested., for compression stocking

## 2012-04-08 ENCOUNTER — Other Ambulatory Visit: Payer: Self-pay

## 2012-04-08 MED ORDER — LABETALOL HCL 200 MG PO TABS: 200.0000 mg | ORAL_TABLET | Freq: Two times a day (BID) | ORAL | Status: DC

## 2012-04-20 ENCOUNTER — Telehealth: Payer: Self-pay

## 2012-04-20 DIAGNOSIS — J209 Acute bronchitis, unspecified: Secondary | ICD-10-CM

## 2012-04-20 DIAGNOSIS — F068 Other specified mental disorders due to known physiological condition: Secondary | ICD-10-CM

## 2012-04-20 DIAGNOSIS — J45909 Unspecified asthma, uncomplicated: Secondary | ICD-10-CM

## 2012-04-20 DIAGNOSIS — I509 Heart failure, unspecified: Secondary | ICD-10-CM

## 2012-04-20 NOTE — Telephone Encounter (Signed)
Ok for verbal 

## 2012-04-20 NOTE — Telephone Encounter (Signed)
HHRN informed of verbal ok per MD 

## 2012-04-20 NOTE — Telephone Encounter (Signed)
HHRN would like verbal ok for PT eval. And home health aide.  Please advise at [phone]

## 2012-04-21 ENCOUNTER — Telehealth: Payer: Self-pay | Admitting: Internal Medicine

## 2012-04-21 ENCOUNTER — Emergency Department (HOSPITAL_COMMUNITY): Payer: Medicare Other

## 2012-04-21 ENCOUNTER — Encounter (HOSPITAL_COMMUNITY): Payer: Self-pay

## 2012-04-21 ENCOUNTER — Emergency Department (HOSPITAL_COMMUNITY)
Admission: EM | Admit: 2012-04-21 | Discharge: 2012-04-21 | Disposition: A | Discharge status: Home / Self Care | Payer: Medicare Other | Attending: Emergency Medicine | Admitting: Emergency Medicine

## 2012-04-21 DIAGNOSIS — F329 Major depressive disorder, single episode, unspecified: Secondary | ICD-10-CM | POA: Insufficient documentation

## 2012-04-21 DIAGNOSIS — K219 Gastro-esophageal reflux disease without esophagitis: Secondary | ICD-10-CM | POA: Insufficient documentation

## 2012-04-21 DIAGNOSIS — Z8781 Personal history of (healed) traumatic fracture: Secondary | ICD-10-CM | POA: Insufficient documentation

## 2012-04-21 DIAGNOSIS — I10 Essential (primary) hypertension: Secondary | ICD-10-CM | POA: Insufficient documentation

## 2012-04-21 DIAGNOSIS — M899 Disorder of bone, unspecified: Secondary | ICD-10-CM | POA: Insufficient documentation

## 2012-04-21 DIAGNOSIS — F3289 Other specified depressive episodes: Secondary | ICD-10-CM | POA: Insufficient documentation

## 2012-04-21 DIAGNOSIS — F0390 Unspecified dementia without behavioral disturbance: Secondary | ICD-10-CM | POA: Insufficient documentation

## 2012-04-21 DIAGNOSIS — Z79899 Other long term (current) drug therapy: Secondary | ICD-10-CM | POA: Insufficient documentation

## 2012-04-21 DIAGNOSIS — E119 Type 2 diabetes mellitus without complications: Secondary | ICD-10-CM | POA: Insufficient documentation

## 2012-04-21 DIAGNOSIS — W010XXA Fall on same level from slipping, tripping and stumbling without subsequent striking against object, initial encounter: Secondary | ICD-10-CM | POA: Insufficient documentation

## 2012-04-21 DIAGNOSIS — S46909A Unspecified injury of unspecified muscle, fascia and tendon at shoulder and upper arm level, unspecified arm, initial encounter: Secondary | ICD-10-CM | POA: Insufficient documentation

## 2012-04-21 DIAGNOSIS — F17200 Nicotine dependence, unspecified, uncomplicated: Secondary | ICD-10-CM | POA: Insufficient documentation

## 2012-04-21 DIAGNOSIS — Z8679 Personal history of other diseases of the circulatory system: Secondary | ICD-10-CM | POA: Insufficient documentation

## 2012-04-21 DIAGNOSIS — W19XXXA Unspecified fall, initial encounter: Secondary | ICD-10-CM

## 2012-04-21 DIAGNOSIS — J45909 Unspecified asthma, uncomplicated: Secondary | ICD-10-CM | POA: Insufficient documentation

## 2012-04-21 DIAGNOSIS — Z7982 Long term (current) use of aspirin: Secondary | ICD-10-CM | POA: Insufficient documentation

## 2012-04-21 DIAGNOSIS — Y9301 Activity, walking, marching and hiking: Secondary | ICD-10-CM | POA: Insufficient documentation

## 2012-04-21 DIAGNOSIS — S4980XA Other specified injuries of shoulder and upper arm, unspecified arm, initial encounter: Secondary | ICD-10-CM | POA: Insufficient documentation

## 2012-04-21 DIAGNOSIS — S4992XA Unspecified injury of left shoulder and upper arm, initial encounter: Secondary | ICD-10-CM

## 2012-04-21 DIAGNOSIS — I5030 Unspecified diastolic (congestive) heart failure: Secondary | ICD-10-CM | POA: Insufficient documentation

## 2012-04-21 DIAGNOSIS — Z794 Long term (current) use of insulin: Secondary | ICD-10-CM | POA: Insufficient documentation

## 2012-04-21 DIAGNOSIS — Y92009 Unspecified place in unspecified non-institutional (private) residence as the place of occurrence of the external cause: Secondary | ICD-10-CM | POA: Insufficient documentation

## 2012-04-21 DIAGNOSIS — Z8673 Personal history of transient ischemic attack (TIA), and cerebral infarction without residual deficits: Secondary | ICD-10-CM | POA: Insufficient documentation

## 2012-04-21 DIAGNOSIS — E785 Hyperlipidemia, unspecified: Secondary | ICD-10-CM | POA: Insufficient documentation

## 2012-04-21 DIAGNOSIS — J309 Allergic rhinitis, unspecified: Secondary | ICD-10-CM | POA: Insufficient documentation

## 2012-04-21 MED ORDER — HYDROCODONE-ACETAMINOPHEN 5-325 MG PO TABS: 1.0000 | ORAL_TABLET | ORAL | Status: DC | PRN

## 2012-04-21 NOTE — ED Provider Notes (Signed)
History     CSN: [id number]  Arrival date & time 04/21/12  1037   First MD Initiated Contact with Patient 04/21/12 1054      Chief Complaint  Patient presents with  . Fall  . Shoulder Pain    (Consider location/radiation/quality/duration/timing/severity/associated sxs/prior treatment) HPI Comments: Patient is a 79 year old female who presents after a fall that occurred prior to arrival. Patient reports walking to the bathroom using her walker and she mis-stepped and fell. She denies LOC or head trauma. She reports left shoulder and left chest wall pain that started after she fell. The pain is aching and moderate without radiation. The patient took 1 vicodin for pain which provided relief. Palpation of the affected areas makes the pain worse. She denies any other injury or associated symptoms. Patient denies dizziness, visual changes, NVD, SOB.   Patient is a 79 y.o. female presenting with fall and shoulder pain.  Fall  Shoulder Pain Associated symptoms include arthralgias and chest pain.    Past Medical History  Diagnosis Date  . Diabetes mellitus type II   . Hyperlipidemia   . HTN (hypertension)   . GERD (gastroesophageal reflux disease)   . Allergic rhinitis   . Depression   . Dementia     mild  . History of CVA (cerebrovascular accident)   . LBBB (left bundle branch block)   . AAA (abdominal aortic aneurysm)     small, last check in 05/2009, due for repeat in 05/2010  . Carotid stenosis 09/2009    40-59% RICA, 0-39% LICA  . LVH (left ventricular hypertrophy) 10/2009    Moderate, EF 55-60%, no regional wall motion abnormalities  . Shellfish allergy     anaphylaxis  . Osteopenia   . Vitamin D deficiency   . Fracture of rib of left side 12/2009  . Asthma 01/27/2011  . Diastolic CHF 08/02/2011    Past Surgical History  Procedure Laterality Date  . Ankle surgery      Right, by Ortho in [place]  . Tracheostomy  2010    anaphylactic episode on vent  . Tubal ligation       Family History  Problem Relation Age of Onset  . Rheum arthritis Sister   . Stroke Brother   . Hypertension Brother   . Diabetes type II Other     2 siblings    History  Substance Use Topics  . Smoking status: Current Every Day Smoker  . Smokeless tobacco: Not on file     Comment: 3/4 ppd  . Alcohol Use: No    OB History   Grav Para Term Preterm Abortions TAB SAB Ect Mult Living                  Review of Systems  Cardiovascular: Positive for chest pain.  Musculoskeletal: Positive for arthralgias.  All other systems reviewed and are negative.    Allergies  Ibuprofen; Shellfish-derived products; Ace inhibitors; and Sulfa antibiotics  Home Medications   Current Outpatient Rx  Name  Route  Sig  Dispense  Refill  . alendronate (FOSAMAX) 70 MG tablet   Oral   Take 1 tablet (70 mg total) by mouth every 7 (seven) days. Take with a full glass of water on an empty stomach.   12 tablet   3   . aspirin 81 MG EC tablet   Oral   Take 81 mg by mouth daily.           Marland Kitchen azithromycin (  ZITHROMAX Z-PAK) 250 MG tablet      Use as directed   6 each   1   . benzonatate (TESSALON) 100 MG capsule      1-2 tabs by mouth three times per day for cough   90 capsule   0   . citalopram (CELEXA) 20 MG tablet   Oral   Take 1 tablet (20 mg total) by mouth daily.   30 tablet   11   . cyclobenzaprine (FLEXERIL) 5 MG tablet   Oral   Take 5 mg by mouth 3 (three) times daily as needed. For muscle spasms.         Marland Kitchen donepezil (ARICEPT) 10 MG tablet      TAKE ONE TABLET BY MOUTH EVERY DAY   30 tablet   11   . famotidine (PEPCID) 40 MG tablet   Oral   Take 1 tablet (40 mg total) by mouth daily.   90 tablet   3   . feeding supplement (RESOURCE BREEZE) LIQD   Oral   Take 1 Container by mouth daily after supper.         . fexofenadine (ALLEGRA) 180 MG tablet   Oral   Take 1 tablet (180 mg total) by mouth daily.   30 tablet   11   . fluticasone (FLONASE) 50  MCG/ACT nasal spray   Nasal   Place 2 sprays into the nose daily as needed. For allergies.         Marland Kitchen HYDROcodone-acetaminophen (VICODIN) 5-500 MG per tablet      TAKE 1/2 TO 1 TABLET BY MOUTH THREE TIMES DAILY AS NEEDED FOR PAIN   90 tablet   1   . insulin glargine (LANTUS) 100 UNIT/ML injection   Subcutaneous   Inject 40 Units into the skin at bedtime.   10 mL   11   . Insulin Pen Needle (B-D UF III MINI PEN NEEDLES) 31G X 5 MM MISC      Use as directed once daily to check blood sugar.  Diagnosis code 250.00   100 each   11   . labetalol (NORMODYNE) 100 MG tablet   Oral   Take 1 tablet (100 mg total) by mouth 2 (two) times daily.         Marland Kitchen labetalol (NORMODYNE) 200 MG tablet   Oral   Take 1 tablet (200 mg total) by mouth 2 (two) times daily.   60 tablet   11   . memantine (NAMENDA) 5 MG tablet   Oral   Take 5 mg by mouth 2 (two) times daily.         . metFORMIN (GLUCOPHAGE) 500 MG tablet   Oral   Take 1,500 mg by mouth daily with breakfast.         . montelukast (SINGULAIR) 10 MG tablet   Oral   Take 1 tablet (10 mg total) by mouth at bedtime.   30 tablet   11   . Multiple Vitamin (MULITIVITAMIN WITH MINERALS) TABS   Oral   Take 1 tablet by mouth daily.           Marland Kitchen NAMENDA 5 MG tablet      TAKE 1 TABLET BY MOUTH TWICE DAILY   180 tablet   3   . NIFEdipine (NIFEDICAL XL) 30 MG 24 hr tablet      TAKE ONE TABLET BY MOUTH DAILY   90 tablet   1   . nystatin (MYCOSTATIN) powder  Topical   Apply 100,000 g topically 2 (two) times daily as needed. For yeast infection between breasts.         . pravastatin (PRAVACHOL) 80 MG tablet   Oral   Take 80 mg by mouth daily.         . risperiDONE (RISPERDAL) 1 MG tablet   Oral   Take 1 tablet (1 mg total) by mouth 2 (two) times daily.   180 tablet   3   . simvastatin (ZOCOR) 40 MG tablet      TAKE 1 TABLET BY MOUTH DAILY   90 tablet   3     BP 126/78  Pulse 63  Temp(Src) 97.8 F (36.6  C)  Resp 18  SpO2 98%  Physical Exam  Nursing note and vitals reviewed. Constitutional: She is oriented to person, place, and time. She appears well-developed and well-nourished. No distress.  HENT:  Head: Normocephalic and atraumatic.  Eyes: Conjunctivae are normal.  Neck: Normal range of motion.  Cardiovascular: Normal rate and regular rhythm.  Exam reveals no gallop and no friction rub.   No murmur heard. Pulmonary/Chest: Effort normal and breath sounds normal. She has no wheezes. She has no rales. She exhibits tenderness.  Left upper anterior chest tenderness to palpation. No bruising or obvious deformity noted. Bilateral rales noted throughout lung fields.   Abdominal: Soft. There is no tenderness.  Musculoskeletal: Normal range of motion.  Neurological: She is alert and oriented to person, place, and time. Coordination normal.  Upper extremity strength and sensation equal and intact bilaterally. Speech is goal-oriented. Moves limbs without ataxia.   Skin: Skin is warm and dry. She is not diaphoretic.  Psychiatric: She has a normal mood and affect. Her behavior is normal.    ED Course  Procedures (including critical care time)  Labs Reviewed - No data to display Dg Chest 2 View  04/21/2012  *RADIOLOGY REPORT*  Clinical Data: Fall, shoulder.  CHEST - 2 VIEW  Comparison: 07/12/2011  Findings: Cardiomegaly is noted.  No acute infiltrate or pleural effusion.  No pulmonary edema.  No diagnostic pneumothorax. Hyperinflation again noted.  IMPRESSION: No active disease.  No diagnostic pneumothorax.   Original Report Authenticated By: Natasha Mead, M.D.    Dg Shoulder Left  04/21/2012  *RADIOLOGY REPORT*  Clinical Data: Fall, shoulder pain  LEFT SHOULDER - 2+ VIEW  Comparison: None.  Findings: Three views of the left shoulder submitted.  The glenohumeral joint is preserved. Moderate degenerative changes left AC joint.  Mild spurring greater humeral tuberosity.  IMPRESSION: No acute fracture  or subluxation.  Degenerative changes as described above.   Original Report Authenticated By: Natasha Mead, M.D.      1. Fall   2. Injury of left shoulder       MDM  11:22 AM Patient declined pain medication at this time. Chest xray and left shoulder xray pending. No neurovascular compromise.   11:55 AM Xray unremarkable for acute abnormality such as fracture or dislocation. Patient will have vicodin prescription for pain and instructions to rest, ice and elevate affected area. Patient instructed to return with worsening or concerning symptoms. Vitals stable for discharge.         Emilia Beck, PA-C 04/21/12 1202

## 2012-04-21 NOTE — ED Notes (Addendum)
Patient reports that she fell going into the bathroom. patient tripped on her shower chair. Patient denies LOC or hitting her head.Patient is now c/o left shoulder pain. Patient's daughter gave the patient a hydrocodone at 0930.

## 2012-04-21 NOTE — Telephone Encounter (Signed)
Patient Information:  Caller Name: Alicia Kent  Phone: [phone]  Patient: Alicia Kent, Alicia Kent  Gender: Female  DOB: 1930-08-24  Age: 79 Years  PCP: Oliver Barre (Adults only)  Office Follow Up:  Does the office need to follow up with this patient?: Yes  Instructions For The Office: Sent to ED due to inability to take deep breath after chest injury; no appts available immediately krs/can  RN Note:  Due to pain when taking deep breath, advised ED per chest injury protocol; family agrees to take to ED.  [facility].  krs/can  Symptoms  Reason For Call & Symptoms: Patient tripped over her shower chair while going into the bathroom and fell.  States she hit her left side near the shoulder on the commode.  Tender to touch.  Reviewed Health History In EMR: Yes  Reviewed Medications In EMR: Yes  Reviewed Allergies In EMR: Yes  Reviewed Surgeries / Procedures: Yes  Date of Onset of Symptoms: 04/21/2012  Guideline(s) Used:  Chest Injury  Disposition Per Guideline:   Go to ED Now (or to Office with PCP Approval)  Reason For Disposition Reached:   Can't take a deep breath but no respiratory distress (e.g., hurts to take a deep breath)  Advice Given:  N/A

## 2012-04-21 NOTE — ED Provider Notes (Signed)
79 year old female fell injuring her left shoulder. On exam, there is mild tenderness to palpation and mild tenderness on extremes of range of motion but no point tenderness. X-ray show no evidence of fracture. She will be treated symptomatically.  Medical screening examination/treatment/procedure(s) were conducted as a shared visit with non-physician practitioner(s) and myself.  I personally evaluated the patient during the encounter   Dione Booze, MD 04/21/12 1200

## 2012-04-24 ENCOUNTER — Telehealth: Payer: Self-pay | Admitting: Internal Medicine

## 2012-04-24 NOTE — Telephone Encounter (Signed)
HHRN informed 

## 2012-04-24 NOTE — Telephone Encounter (Signed)
Ok for verbal 

## 2012-04-24 NOTE — Telephone Encounter (Signed)
Jae Dire, Physical therapist with [facility], is calling to get a verbal order for PT 2x a day for 6 weeks, she can be reached at [phone]

## 2012-04-27 NOTE — Telephone Encounter (Signed)
[facility] Jae Dire informed of verbal at [phone]

## 2012-04-28 ENCOUNTER — Telehealth: Payer: Self-pay

## 2012-04-28 NOTE — Telephone Encounter (Signed)
HHRN informed 

## 2012-04-28 NOTE — Telephone Encounter (Signed)
Ok for verbal 

## 2012-04-28 NOTE — Telephone Encounter (Signed)
[facility] called requesting verbal order for speech therapy evaluation and treatment for pt, please advise.

## 2012-05-14 ENCOUNTER — Telehealth: Payer: Self-pay

## 2012-05-14 NOTE — Telephone Encounter (Signed)
HHRN informed 

## 2012-05-14 NOTE — Telephone Encounter (Signed)
[facility] called requesting verbal authorization for social work consult per daughter's request. Daughter is feeling overwhelmed caring for mother is would like information regarding additional resources for this pt

## 2012-05-14 NOTE — Telephone Encounter (Signed)
Ok for verbal 

## 2012-05-23 ENCOUNTER — Encounter (HOSPITAL_COMMUNITY): Payer: Self-pay | Admitting: Emergency Medicine

## 2012-05-23 ENCOUNTER — Emergency Department (HOSPITAL_COMMUNITY): Payer: Medicare Other

## 2012-05-23 ENCOUNTER — Inpatient Hospital Stay (HOSPITAL_COMMUNITY)
Admission: EM | Admit: 2012-05-23 | Discharge: 2012-05-26 | DRG: 193 | Disposition: A | Discharge status: Home Health | Payer: Medicare Other | Attending: Internal Medicine | Admitting: Internal Medicine

## 2012-05-23 ENCOUNTER — Other Ambulatory Visit: Payer: Self-pay

## 2012-05-23 DIAGNOSIS — R000 Tachycardia, unspecified: Secondary | ICD-10-CM | POA: Diagnosis present

## 2012-05-23 DIAGNOSIS — S22080D Wedge compression fracture of T11-T12 vertebra, subsequent encounter for fracture with routine healing: Secondary | ICD-10-CM

## 2012-05-23 DIAGNOSIS — J209 Acute bronchitis, unspecified: Secondary | ICD-10-CM | POA: Diagnosis present

## 2012-05-23 DIAGNOSIS — M546 Pain in thoracic spine: Secondary | ICD-10-CM

## 2012-05-23 DIAGNOSIS — J441 Chronic obstructive pulmonary disease with (acute) exacerbation: Secondary | ICD-10-CM | POA: Diagnosis present

## 2012-05-23 DIAGNOSIS — F0391 Unspecified dementia with behavioral disturbance: Secondary | ICD-10-CM | POA: Diagnosis present

## 2012-05-23 DIAGNOSIS — Z8719 Personal history of other diseases of the digestive system: Secondary | ICD-10-CM

## 2012-05-23 DIAGNOSIS — F068 Other specified mental disorders due to known physiological condition: Secondary | ICD-10-CM

## 2012-05-23 DIAGNOSIS — I447 Left bundle-branch block, unspecified: Secondary | ICD-10-CM

## 2012-05-23 DIAGNOSIS — Z79899 Other long term (current) drug therapy: Secondary | ICD-10-CM

## 2012-05-23 DIAGNOSIS — M5431 Sciatica, right side: Secondary | ICD-10-CM

## 2012-05-23 DIAGNOSIS — J9600 Acute respiratory failure, unspecified whether with hypoxia or hypercapnia: Secondary | ICD-10-CM | POA: Diagnosis present

## 2012-05-23 DIAGNOSIS — B370 Candidal stomatitis: Secondary | ICD-10-CM

## 2012-05-23 DIAGNOSIS — I714 Abdominal aortic aneurysm, without rupture, unspecified: Secondary | ICD-10-CM | POA: Diagnosis present

## 2012-05-23 DIAGNOSIS — J189 Pneumonia, unspecified organism: Principal | ICD-10-CM | POA: Diagnosis present

## 2012-05-23 DIAGNOSIS — I5032 Chronic diastolic (congestive) heart failure: Secondary | ICD-10-CM | POA: Diagnosis present

## 2012-05-23 DIAGNOSIS — J45901 Unspecified asthma with (acute) exacerbation: Secondary | ICD-10-CM

## 2012-05-23 DIAGNOSIS — E119 Type 2 diabetes mellitus without complications: Secondary | ICD-10-CM | POA: Diagnosis present

## 2012-05-23 DIAGNOSIS — Z8673 Personal history of transient ischemic attack (TIA), and cerebral infarction without residual deficits: Secondary | ICD-10-CM

## 2012-05-23 DIAGNOSIS — N390 Urinary tract infection, site not specified: Secondary | ICD-10-CM | POA: Diagnosis present

## 2012-05-23 DIAGNOSIS — E871 Hypo-osmolality and hyponatremia: Secondary | ICD-10-CM | POA: Diagnosis present

## 2012-05-23 DIAGNOSIS — R791 Abnormal coagulation profile: Secondary | ICD-10-CM | POA: Diagnosis present

## 2012-05-23 DIAGNOSIS — L03031 Cellulitis of right toe: Secondary | ICD-10-CM

## 2012-05-23 DIAGNOSIS — J9601 Acute respiratory failure with hypoxia: Secondary | ICD-10-CM

## 2012-05-23 DIAGNOSIS — R531 Weakness: Secondary | ICD-10-CM

## 2012-05-23 DIAGNOSIS — D649 Anemia, unspecified: Secondary | ICD-10-CM

## 2012-05-23 DIAGNOSIS — I509 Heart failure, unspecified: Secondary | ICD-10-CM | POA: Diagnosis present

## 2012-05-23 DIAGNOSIS — F17200 Nicotine dependence, unspecified, uncomplicated: Secondary | ICD-10-CM | POA: Diagnosis present

## 2012-05-23 DIAGNOSIS — F0390 Unspecified dementia without behavioral disturbance: Secondary | ICD-10-CM | POA: Diagnosis present

## 2012-05-23 DIAGNOSIS — R296 Repeated falls: Secondary | ICD-10-CM

## 2012-05-23 DIAGNOSIS — F3289 Other specified depressive episodes: Secondary | ICD-10-CM | POA: Diagnosis present

## 2012-05-23 DIAGNOSIS — F329 Major depressive disorder, single episode, unspecified: Secondary | ICD-10-CM

## 2012-05-23 DIAGNOSIS — R32 Unspecified urinary incontinence: Secondary | ICD-10-CM

## 2012-05-23 DIAGNOSIS — J440 Chronic obstructive pulmonary disease with acute lower respiratory infection: Secondary | ICD-10-CM

## 2012-05-23 DIAGNOSIS — I5030 Unspecified diastolic (congestive) heart failure: Secondary | ICD-10-CM | POA: Diagnosis present

## 2012-05-23 DIAGNOSIS — Z794 Long term (current) use of insulin: Secondary | ICD-10-CM

## 2012-05-23 DIAGNOSIS — I10 Essential (primary) hypertension: Secondary | ICD-10-CM | POA: Diagnosis present

## 2012-05-23 DIAGNOSIS — R5381 Other malaise: Secondary | ICD-10-CM

## 2012-05-23 DIAGNOSIS — K219 Gastro-esophageal reflux disease without esophagitis: Secondary | ICD-10-CM | POA: Diagnosis present

## 2012-05-23 DIAGNOSIS — R0902 Hypoxemia: Secondary | ICD-10-CM

## 2012-05-23 DIAGNOSIS — D72829 Elevated white blood cell count, unspecified: Secondary | ICD-10-CM | POA: Diagnosis present

## 2012-05-23 DIAGNOSIS — J45909 Unspecified asthma, uncomplicated: Secondary | ICD-10-CM

## 2012-05-23 DIAGNOSIS — R21 Rash and other nonspecific skin eruption: Secondary | ICD-10-CM

## 2012-05-23 DIAGNOSIS — E785 Hyperlipidemia, unspecified: Secondary | ICD-10-CM | POA: Diagnosis present

## 2012-05-23 LAB — CBC WITH DIFFERENTIAL/PLATELET
Basophils Absolute: 0 10*3/uL (ref 0.0–0.1)
Basophils Relative: 0 % (ref 0–1)
HCT: 38.6 % (ref 36.0–46.0)
Lymphocytes Relative: 9 % — ABNORMAL LOW (ref 12–46)
MCHC: 33.4 g/dL (ref 30.0–36.0)
Neutro Abs: 14.1 10*3/uL — ABNORMAL HIGH (ref 1.7–7.7)
Neutrophils Relative %: 84 % — ABNORMAL HIGH (ref 43–77)
Platelets: 346 10*3/uL (ref 150–400)
RDW: 13.3 % (ref 11.5–15.5)
WBC: 16.9 10*3/uL — ABNORMAL HIGH (ref 4.0–10.5)

## 2012-05-23 LAB — PROTIME-INR: Prothrombin Time: 13.3 seconds (ref 11.6–15.2)

## 2012-05-23 LAB — POCT I-STAT TROPONIN I: Troponin i, poc: 0 ng/mL (ref 0.00–0.08)

## 2012-05-23 LAB — BASIC METABOLIC PANEL
CO2: 23 mEq/L (ref 19–32)
Chloride: 97 mEq/L (ref 96–112)
GFR calc Af Amer: >90 mL/min (ref >90)
Potassium: 4.2 mEq/L (ref 3.5–5.1)
Sodium: 132 mEq/L — ABNORMAL LOW (ref 135–145)

## 2012-05-23 LAB — PRO B NATRIURETIC PEPTIDE: Pro B Natriuretic peptide (BNP): 719 pg/mL — ABNORMAL HIGH (ref 0–450)

## 2012-05-23 MED ORDER — ASPIRIN EC 81 MG PO TBEC
81.0000 mg | DELAYED_RELEASE_TABLET | Freq: Every morning | ORAL | Status: DC
  Administered 2012-05-24 – 2012-05-26 (×3): 81 mg via ORAL
  Filled 2012-05-23 (×3): qty 1

## 2012-05-23 MED ORDER — SODIUM CHLORIDE 0.9 % IJ SOLN
3.0000 mL | Freq: Two times a day (BID) | INTRAMUSCULAR | Status: DC
  Administered 2012-05-23 – 2012-05-25 (×5): 3 mL via INTRAVENOUS

## 2012-05-23 MED ORDER — IPRATROPIUM BROMIDE 0.02 % IN SOLN
0.5000 mg | Freq: Once | RESPIRATORY_TRACT | Status: AC
  Administered 2012-05-23: 0.5 mg via RESPIRATORY_TRACT
  Filled 2012-05-23: qty 2.5

## 2012-05-23 MED ORDER — NIFEDIPINE ER 30 MG PO TB24
30.0000 mg | ORAL_TABLET | Freq: Every morning | ORAL | Status: DC
  Administered 2012-05-24 – 2012-05-26 (×3): 30 mg via ORAL
  Filled 2012-05-23 (×3): qty 1

## 2012-05-23 MED ORDER — LEVALBUTEROL HCL 0.63 MG/3ML IN NEBU
0.6300 mg | INHALATION_SOLUTION | Freq: Four times a day (QID) | RESPIRATORY_TRACT | Status: DC
  Filled 2012-05-23 (×2): qty 3

## 2012-05-23 MED ORDER — ALBUTEROL SULFATE (5 MG/ML) 0.5% IN NEBU
5.0000 mg | INHALATION_SOLUTION | Freq: Once | RESPIRATORY_TRACT | Status: AC
  Administered 2012-05-23: 5 mg via RESPIRATORY_TRACT
  Filled 2012-05-23: qty 1

## 2012-05-23 MED ORDER — SODIUM CHLORIDE 0.9 % IJ SOLN: 3.0000 mL | INTRAMUSCULAR | Status: DC | PRN

## 2012-05-23 MED ORDER — GUAIFENESIN-DM 100-10 MG/5ML PO SYRP: 5.0000 mL | ORAL_SOLUTION | ORAL | Status: DC | PRN

## 2012-05-23 MED ORDER — IPRATROPIUM BROMIDE 0.02 % IN SOLN
0.5000 mg | Freq: Four times a day (QID) | RESPIRATORY_TRACT | Status: DC
  Administered 2012-05-24: 0.5 mg via RESPIRATORY_TRACT
  Filled 2012-05-23: qty 2.5

## 2012-05-23 MED ORDER — FLUTICASONE PROPIONATE 50 MCG/ACT NA SUSP
2.0000 | Freq: Every day | NASAL | Status: DC
  Administered 2012-05-24 – 2012-05-26 (×3): 2 via NASAL
  Filled 2012-05-23: qty 16

## 2012-05-23 MED ORDER — MONTELUKAST SODIUM 10 MG PO TABS
10.0000 mg | ORAL_TABLET | Freq: Every day | ORAL | Status: DC
  Administered 2012-05-24 – 2012-05-25 (×3): 10 mg via ORAL
  Filled 2012-05-23 (×4): qty 1

## 2012-05-23 MED ORDER — LABETALOL HCL 200 MG PO TABS
200.0000 mg | ORAL_TABLET | Freq: Two times a day (BID) | ORAL | Status: DC
  Administered 2012-05-24 – 2012-05-26 (×6): 200 mg via ORAL
  Filled 2012-05-23 (×7): qty 1

## 2012-05-23 MED ORDER — MEMANTINE HCL 5 MG PO TABS
5.0000 mg | ORAL_TABLET | Freq: Two times a day (BID) | ORAL | Status: DC
  Administered 2012-05-24 – 2012-05-26 (×6): 5 mg via ORAL
  Filled 2012-05-23 (×7): qty 1

## 2012-05-23 MED ORDER — ALBUTEROL SULFATE (5 MG/ML) 0.5% IN NEBU
2.5000 mg | INHALATION_SOLUTION | Freq: Four times a day (QID) | RESPIRATORY_TRACT | Status: DC
  Administered 2012-05-24: 2.5 mg via RESPIRATORY_TRACT
  Filled 2012-05-23: qty 0.5

## 2012-05-23 MED ORDER — RISPERIDONE 1 MG PO TABS
1.0000 mg | ORAL_TABLET | Freq: Every day | ORAL | Status: DC
  Administered 2012-05-24 – 2012-05-25 (×3): 1 mg via ORAL
  Filled 2012-05-23 (×4): qty 1

## 2012-05-23 MED ORDER — LEVALBUTEROL HCL 0.63 MG/3ML IN NEBU
0.6300 mg | INHALATION_SOLUTION | RESPIRATORY_TRACT | Status: DC | PRN
  Filled 2012-05-23: qty 3

## 2012-05-23 MED ORDER — SODIUM CHLORIDE 0.9 % IV SOLN: 250.0000 mL | INTRAVENOUS | Status: DC | PRN

## 2012-05-23 MED ORDER — GUAIFENESIN ER 600 MG PO TB12
600.0000 mg | ORAL_TABLET | Freq: Two times a day (BID) | ORAL | Status: DC
  Administered 2012-05-24 – 2012-05-26 (×6): 600 mg via ORAL
  Filled 2012-05-23 (×7): qty 1

## 2012-05-23 MED ORDER — ONDANSETRON HCL 4 MG/2ML IJ SOLN: 4.0000 mg | Freq: Three times a day (TID) | INTRAMUSCULAR | Status: AC | PRN

## 2012-05-23 MED ORDER — SIMVASTATIN 40 MG PO TABS
40.0000 mg | ORAL_TABLET | Freq: Every day | ORAL | Status: DC
  Administered 2012-05-24 – 2012-05-25 (×3): 40 mg via ORAL
  Filled 2012-05-23 (×4): qty 1

## 2012-05-23 MED ORDER — FAMOTIDINE 40 MG PO TABS
40.0000 mg | ORAL_TABLET | Freq: Every morning | ORAL | Status: DC
  Administered 2012-05-24 – 2012-05-26 (×3): 40 mg via ORAL
  Filled 2012-05-23 (×3): qty 1

## 2012-05-23 MED ORDER — INSULIN GLARGINE 100 UNIT/ML ~~LOC~~ SOLN
45.0000 [IU] | Freq: Every day | SUBCUTANEOUS | Status: DC
  Administered 2012-05-24 – 2012-05-25 (×3): 45 [IU] via SUBCUTANEOUS
  Filled 2012-05-23 (×4): qty 0.45

## 2012-05-23 MED ORDER — LEVOFLOXACIN IN D5W 750 MG/150ML IV SOLN
750.0000 mg | Freq: Every day | INTRAVENOUS | Status: DC
  Administered 2012-05-24 – 2012-05-25 (×3): 750 mg via INTRAVENOUS
  Filled 2012-05-23 (×4): qty 150

## 2012-05-23 MED ORDER — SODIUM CHLORIDE 0.9 % IJ SOLN
3.0000 mL | Freq: Two times a day (BID) | INTRAMUSCULAR | Status: DC
  Administered 2012-05-24 – 2012-05-26 (×5): 3 mL via INTRAVENOUS

## 2012-05-23 MED ORDER — CITALOPRAM HYDROBROMIDE 20 MG PO TABS
20.0000 mg | ORAL_TABLET | Freq: Every morning | ORAL | Status: DC
  Administered 2012-05-24 – 2012-05-26 (×3): 20 mg via ORAL
  Filled 2012-05-23 (×3): qty 1

## 2012-05-23 MED ORDER — METFORMIN HCL ER 750 MG PO TB24
1500.0000 mg | ORAL_TABLET | Freq: Every day | ORAL | Status: DC
  Filled 2012-05-23 (×2): qty 2

## 2012-05-23 MED ORDER — DONEPEZIL HCL 10 MG PO TABS
10.0000 mg | ORAL_TABLET | Freq: Every morning | ORAL | Status: DC
  Administered 2012-05-24 – 2012-05-26 (×3): 10 mg via ORAL
  Filled 2012-05-23 (×3): qty 1

## 2012-05-23 MED ORDER — ENOXAPARIN SODIUM 40 MG/0.4ML ~~LOC~~ SOLN
40.0000 mg | Freq: Every day | SUBCUTANEOUS | Status: DC
  Administered 2012-05-24 – 2012-05-25 (×3): 40 mg via SUBCUTANEOUS
  Filled 2012-05-23 (×4): qty 0.4

## 2012-05-23 NOTE — H&P (Signed)
[facility] History and Physical  Keyunna Coco ION:[id number] DOB: Nov 30, 1930 DOA: 05/23/2012  Referring physician: Dr. Radford Pax PCP: Oliver Barre, MD  Specialists:  Chief Complaint: Shortness of breath with exertion  HPI: Alicia Kent is a 79 y.o. female with multiple medical problems as listed below including asthma/COPD, diastolic CHF, dementia who presents with the above complaints. She is obtained from the bedside. She states that today she took patient to walk and noted that she got very short of breath with walking. Patient also had a cough productive of old sputum. She was brought to the ED and per EDP was wheezing on lung exam. She denies chest pain, fevers, dysuria, diarrhea, melena and no hematochezia.  In the ED a chest x-ray was negative for acute infiltrates, and EKG showed a junctional tachycardia at 102 with no acute ischemic changes. Per EDP plan was to discharge her home when O2 was taken off on she ambulated her sats dropped to 87% and she got tachycardic to the 134. she is admitted for further evaluation and management.   Review of Systems: The patient denies anorexia, fever, weight loss,, vision loss, decreased hearing, hoarseness, chest pain, syncope, peripheral edema, hemoptysis, abdominal pain, melena, hematochezia, severe indigestion/heartburn, hematuria, , suspicious skin lesions, transient blindness, difficulty walking, depression, unusual weight change, abnormal bleeding.  Past Medical History  Diagnosis Date  . Diabetes mellitus type II   . Hyperlipidemia   . HTN (hypertension)   . GERD (gastroesophageal reflux disease)   . Allergic rhinitis   . Depression   . Dementia     mild  . History of CVA (cerebrovascular accident)   . LBBB (left bundle branch block)   . AAA (abdominal aortic aneurysm)     small, last check in 05/2009, due for repeat in 05/2010  . Carotid stenosis 09/2009    40-59% RICA, 0-39% LICA  . LVH (left ventricular hypertrophy) 10/2009   Moderate, EF 55-60%, no regional wall motion abnormalities  . Shellfish allergy     anaphylaxis  . Osteopenia   . Vitamin D deficiency   . Fracture of rib of left side 12/2009  . Asthma 01/27/2011  . Diastolic CHF 08/02/2011   Past Surgical History  Procedure Laterality Date  . Ankle surgery      Right, by Ortho in [place]  . Tracheostomy  2010    anaphylactic episode on vent  . Tubal ligation     Social History:  reports that she has been smoking.  She does not have any smokeless tobacco history on file. She reports that she does not drink alcohol or use illicit drugs.  where does patient live--home   Allergies  Allergen Reactions  . Ibuprofen Anaphylaxis  . Shellfish-Derived Products Anaphylaxis    Swelling tongue and throat  . Ace Inhibitors Other (See Comments)    unknown  . Sulfa Antibiotics Nausea And Vomiting    Family History  Problem Relation Age of Onset  . Rheum arthritis Sister   . Stroke Brother   . Hypertension Brother   . Diabetes type II Other     2 siblings    Prior to Admission medications   Medication Sig Start Date End Date Taking? Authorizing Provider  alendronate (FOSAMAX) 70 MG tablet Take 70 mg by mouth every Thursday. 10/05/11 10/04/12 Yes Corwin Levins, MD  aspirin 81 MG EC tablet Take 81 mg by mouth every morning.    Yes Historical Provider, MD  citalopram (CELEXA) 20 MG tablet Take 20 mg by  mouth every morning. 03/11/12  Yes Corwin Levins, MD  donepezil (ARICEPT) 10 MG tablet Take 10 mg by mouth every morning.   Yes Historical Provider, MD  famotidine (PEPCID) 40 MG tablet Take 40 mg by mouth every morning. 12/23/11  Yes Corwin Levins, MD  fluticasone (FLONASE) 50 MCG/ACT nasal spray Place 2 sprays into the nose daily as needed. For allergies.   Yes Corwin Levins, MD  insulin glargine (LANTUS) 100 UNIT/ML injection Inject 45 Units into the skin at bedtime. 03/11/12  Yes Corwin Levins, MD  labetalol (NORMODYNE) 200 MG tablet Take 200 mg by mouth 2 (two)  times daily. 04/08/12  Yes Corwin Levins, MD  memantine (NAMENDA) 5 MG tablet Take 5 mg by mouth 2 (two) times daily.   Yes Historical Provider, MD  metFORMIN (GLUCOPHAGE-XR) 500 MG 24 hr tablet Take 1,500 mg by mouth every morning.   Yes Historical Provider, MD  montelukast (SINGULAIR) 10 MG tablet Take 10 mg by mouth at bedtime. 03/11/12  Yes Corwin Levins, MD  NIFEdipine (PROCARDIA-XL/ADALAT-CC/NIFEDICAL-XL) 30 MG 24 hr tablet Take 30 mg by mouth every morning.   Yes Historical Provider, MD  risperiDONE (RISPERDAL) 1 MG tablet Take 1 mg by mouth at bedtime. 04/06/12  Yes Corwin Levins, MD  simvastatin (ZOCOR) 40 MG tablet Take 40 mg by mouth at bedtime.   Yes Historical Provider, MD   Physical Exam: Filed Vitals:   05/23/12 2008 05/23/12 2100 05/23/12 2200 05/23/12 2211  BP: 151/76 152/74 145/59   Pulse:  95 92   Temp:      TempSrc:      Resp: 29 23 24    SpO2:  99% 94% 95%    Constitutional: Vital signs reviewed.  Patient is a well-developed and well-nourished *in no acute distress and cooperative with exam. Awake and oriented to person and place.  Head: Normocephalic and atraumatic Mouth: no erythema or exudates, MMM Eyes: PERRL, EOMI, conjunctivae normal, No scleral icterus.  Neck: Supple, Trachea midline normal ROM, No JVD, mass, thyromegaly, or carotid bruit present.  Cardiovascular: RRR, S1 normal, S2 normal, no MRG, pulses symmetric and intact bilaterally Pulmonary/Chest: Scattered rhonchi bilaterally, no wheezes. Abdominal: Soft. Non-tender, non-distended, bowel sounds are normal, no masses, organomegaly, or guarding present.  GU: no CVA tenderness Extremities: No cyanosis and no edema  Neurological: Strength is normal and symmetric bilaterally, cranial nerve II-XII are grossly intact, no focal motor deficit, sensory intact to light touch bilaterally.  Skin: Warm, dry and intact. No rash.  Psychiatric: Normal mood and affect.  Labs on Admission:  Basic Metabolic Panel:  Recent  Labs Lab 05/23/12 2000  NA 132*  K 4.2  CL 97  CO2 23  GLUCOSE 159*  BUN 10  CREATININE 0.65  CALCIUM 9.2   Liver Function Tests: No results found for this basename: AST, ALT, ALKPHOS, BILITOT, PROT, ALBUMIN,  in the last 168 hours No results found for this basename: LIPASE, AMYLASE,  in the last 168 hours No results found for this basename: AMMONIA,  in the last 168 hours CBC:  Recent Labs Lab 05/23/12 2000  WBC 16.9*  NEUTROABS 14.1*  HGB 12.9  HCT 38.6  MCV 87.1  PLT 346   Cardiac Enzymes: No results found for this basename: CKTOTAL, CKMB, CKMBINDEX, TROPONINI,  in the last 168 hours  BNP (last 3 results)  Recent Labs  05/23/12 2000  PROBNP 719.0*   CBG: No results found for this basename: GLUCAP,  in the  last 168 hours  Radiological Exams on Admission: Dg Chest 2 View  05/23/2012  *RADIOLOGY REPORT*  Clinical Data: Short of breath and cough  CHEST - 2 VIEW  Comparison: 04/21/2012  Findings: COPD with hyperinflation and prominent lung markings. Negative for pneumonia.  Negative for heart failure or effusion.  IMPRESSION: COPD.  No acute cardiopulmonary abnormality.   Original Report Authenticated By: Janeece Riggers, M.D.     EKG: Independently reviewed. Junctional tachycardia rate of 102, no acute ischemic changes. Incomplete left bundle branch block  Assessment/Plan Active Problems:  Asthma/COPD with Acute bronchitis -Chest x-ray with no acute infiltrates-negative for pneumonia and no pulmonary edema -Will place on empiric antibiotics with Levaquin, nebulized bronchodilators and follow -Check a d-dimer given the hypoxia and tachycardia noted in ED and prompted her admission - follow and if elevated will need CT angio to further eval. DIABETES MELLITUS, TYPE II -Continue on Lantus and metformin, monitor Accu-Cheks with sliding scale insulin DEMENTIA -Continue outpatient medications   Leukocytosis -Possibly secondary to #1, will also obtain a urinalysis with  culture and follow   Hyponatremia -Mild, Follow recheck   Diastolic CHF  -Her BNP is 719, which is improved from that which she had a year ago of 1128 -She has no evidence of volume overload on exam and chest x-ray shows no pulmonary edema -follow, Monitor fluid status closely  And further treat accordingly. H/O transient atrial fibrillation -Continue aspirin, she is usually not on any rate control meds outpatient. Monitor on telemetry and further treat accordingly. Code Status: full Family Communication: daughter (who is POA) at bedside Disposition Plan: admit to Tele  Time spent: >5mins  Kela Millin [facility] Pager [phone]  If 7PM-7AM, please contact night-coverage [email or web address] Password [facility] 05/23/2012, 10:40 PM

## 2012-05-23 NOTE — ED Notes (Signed)
MD at bedside. 

## 2012-05-23 NOTE — ED Notes (Signed)
Writer to bedside to start IV and draw labs, patient not in room, patient gone to XR.

## 2012-05-23 NOTE — ED Provider Notes (Signed)
History     CSN: [id number]  Arrival date & time 05/23/12  4098   First MD Initiated Contact with Patient 05/23/12 1908      Chief Complaint  Patient presents with  . Shortness of Breath  . Cough     HPI Per EMS. Pt from home. Has hx of chronic cough that is worse today. Has yellow productive sputum, pt states she is unable to clear. Pt has dementia. Decreased appetite today, no meds taken today  Past Medical History  Diagnosis Date  . Diabetes mellitus type II   . Hyperlipidemia   . HTN (hypertension)   . GERD (gastroesophageal reflux disease)   . Allergic rhinitis   . Depression   . Dementia     mild  . History of CVA (cerebrovascular accident)   . LBBB (left bundle branch block)   . AAA (abdominal aortic aneurysm)     small, last check in 05/2009, due for repeat in 05/2010  . Carotid stenosis 09/2009    40-59% RICA, 0-39% LICA  . LVH (left ventricular hypertrophy) 10/2009    Moderate, EF 55-60%, no regional wall motion abnormalities  . Shellfish allergy     anaphylaxis  . Osteopenia   . Vitamin D deficiency   . Fracture of rib of left side 12/2009  . Asthma 01/27/2011  . Diastolic CHF 08/02/2011    Past Surgical History  Procedure Laterality Date  . Ankle surgery      Right, by Ortho in [place]  . Tracheostomy  2010    anaphylactic episode on vent  . Tubal ligation      Family History  Problem Relation Age of Onset  . Rheum arthritis Sister   . Stroke Brother   . Hypertension Brother   . Diabetes type II Other     2 siblings    History  Substance Use Topics  . Smoking status: Current Every Day Smoker  . Smokeless tobacco: Not on file     Comment: 3/4 ppd  . Alcohol Use: No    OB History   Grav Para Term Preterm Abortions TAB SAB Ect Mult Living                  Review of Systems  Allergies  Ibuprofen; Shellfish-derived products; Ace inhibitors; and Sulfa antibiotics  Home Medications   No current outpatient prescriptions on  file.  BP 149/64  Pulse 72  Temp(Src) 97.6 F (36.4 C) (Oral)  Resp 18  Ht 5\' 7"  (1.702 m)  Wt 171 lb 4.8 oz (77.7 kg)  BMI 26.82 kg/m2  SpO2 94%  Physical Exam  Nursing note and vitals reviewed. Constitutional: She is oriented to person, place, and time. She appears well-developed and well-nourished. No distress.  HENT:  Head: Normocephalic and atraumatic.  Eyes: Pupils are equal, round, and reactive to light.  Neck: Normal range of motion.  Cardiovascular: Normal rate and intact distal pulses.   Pulmonary/Chest: No accessory muscle usage. She is in respiratory distress (mild). She has wheezes.  Abdominal: Normal appearance. She exhibits no distension.  Musculoskeletal: Normal range of motion.  Neurological: She is alert and oriented to person, place, and time. No cranial nerve deficit.  Skin: Skin is warm and dry. No rash noted.  Psychiatric: She has a normal mood and affect. Her behavior is normal.    ED Course  Procedures (including critical care time)  Labs Reviewed  CBC WITH DIFFERENTIAL - Abnormal; Notable for the following:    WBC  16.9 (*)    Neutrophils Relative 84 (*)    Neutro Abs 14.1 (*)    Lymphocytes Relative 9 (*)    Monocytes Absolute 1.3 (*)    All other components within normal limits  BASIC METABOLIC PANEL - Abnormal; Notable for the following:    Sodium 132 (*)    Glucose, Bld 159 (*)    GFR calc non Af Amer 82 (*)    All other components within normal limits  PRO B NATRIURETIC PEPTIDE - Abnormal; Notable for the following:    Pro B Natriuretic peptide (BNP) 719.0 (*)    All other components within normal limits  BASIC METABOLIC PANEL - Abnormal; Notable for the following:    Glucose, Bld 105 (*)    GFR calc non Af Amer 78 (*)    All other components within normal limits  CBC - Abnormal; Notable for the following:    WBC 13.1 (*)    Hemoglobin 11.5 (*)    HCT 34.7 (*)    All other components within normal limits  URINALYSIS, ROUTINE W  REFLEX MICROSCOPIC - Abnormal; Notable for the following:    Color, Urine AMBER (*)    APPearance CLOUDY (*)    Specific Gravity, Urine 1.036 (*)    Bilirubin Urine SMALL (*)    Leukocytes, UA TRACE (*)    All other components within normal limits  D-DIMER, QUANTITATIVE - Abnormal; Notable for the following:    D-Dimer, Quant 1.45 (*)    All other components within normal limits  URINE MICROSCOPIC-ADD ON - Abnormal; Notable for the following:    Squamous Epithelial / LPF FEW (*)    All other components within normal limits  GLUCOSE, CAPILLARY - Abnormal; Notable for the following:    Glucose-Capillary 106 (*)    All other components within normal limits  CULTURE, EXPECTORATED SPUTUM-ASSESSMENT  URINE CULTURE  CULTURE, BLOOD (ROUTINE X 2)  CULTURE, BLOOD (ROUTINE X 2)  CULTURE, RESPIRATORY (NON-EXPECTORATED)  PROTIME-INR  TROPONIN I  TROPONIN I  TROPONIN I  LEGIONELLA ANTIGEN, URINE  STREP PNEUMONIAE URINARY ANTIGEN  POCT I-STAT TROPONIN I   Dg Chest 2 View  05/23/2012  *RADIOLOGY REPORT*  Clinical Data: Short of breath and cough  CHEST - 2 VIEW  Comparison: 04/21/2012  Findings: COPD with hyperinflation and prominent lung markings. Negative for pneumonia.  Negative for heart failure or effusion.  IMPRESSION: COPD.  No acute cardiopulmonary abnormality.   Original Report Authenticated By: Janeece Riggers, M.D.    Ct Angio Chest Pe W/cm &/or Wo Cm  05/24/2012  *RADIOLOGY REPORT*  Clinical Data: Elevated D-dimer  CT ANGIOGRAPHY CHEST  Technique:  Multidetector CT imaging of the chest using the standard protocol during bolus administration of intravenous contrast. Multiplanar reconstructed images including MIPs were obtained and reviewed to evaluate the vascular anatomy.  Contrast: OMNIPAQUE IOHEXOL 350 MG/ML SOLN  Comparison: 07/12/2011.  Findings: Sagittal images of the spine shows diffuse osteopenia. Mild degenerative changes.  There is probable chronic mild compression deformity  superior endplate of the T4 vertebral body.  Moderate compression fracture T12 vertebral body is stable from 07/12/2011. There is mild spinal canal stenosis due to bony protrusion at T12 level.  Atherosclerotic calcifications and atherosclerotic plaques are noted thoracic aorta.  No aortic aneurysm.  Visualized upper abdomen shows a probable adenoma  in  left adrenal gland measures 1.3 cm.  Stable from prior CT scan 03/07/2011.  The study is of excellent technical quality.  No pulmonary embolus is  noted.  No mediastinal hematoma or adenopathy.  Heart size is within normal limits.  No pericardial effusion.  Mild left infrahilar peribronchial thickening is noted suspicious for bronchitic changes.  There is patchy airspace disease in the left lower lobe posteriorly highly suspicious for pneumonia.  No pulmonary nodules are noted.  No pulmonary edema. Mild emphysematous changes bilateral upper lobe.  IMPRESSION:  1. No pulmonary embolus is noted. 2.  Mild infrahilar peribronchial thickening suspicious for bronchitic changes.  There is patchy airspace disease in the left lower lobe posteriorly highly suspicious for infiltrate/pneumonia. 3.  No adenopathy.  4.  There is mild compression deformity superior endplate of the T4 vertebral body.  This is probable chronic in nature.  Stable moderate compression fracture of T12 vertebral body.  Mild spinal canal stenosis at T12 level.  5.  Stable probable adenoma left adrenal gland.   Original Report Authenticated By: Natasha Mead, M.D.      1. COPD exacerbation   2. Leukocytosis   3. Hypoxia   4. COPD with acute bronchitis   5. Asthma   6. Type II or unspecified type diabetes mellitus without mention of complication, not stated as uncontrolled   7. Unspecified essential hypertension   8. Weakness generalized   9. Acute exacerbation of COPD with asthma   10. Acute respiratory failure with hypoxia   11. CAP (community acquired pneumonia)   12. UTI (urinary tract  infection)       MDM          Nelia Shi, MD 05/25/12 [id number]

## 2012-05-23 NOTE — ED Notes (Signed)
[id number]<AV> Expected date:<BR> Expected time:<BR> Means of arrival:<BR> Comments:<BR> hold

## 2012-05-23 NOTE — ED Notes (Signed)
Per EMS.  Pt from home.  Has hx of chronic cough that is worse today.  Has yellow productive sputum, pt states she is unable to clear.  Pt has dementia.  Decreased appetite today, no meds taken today. EMs reports 94% on RA.  97% on 2L [place].  24-26 RR.

## 2012-05-23 NOTE — ED Notes (Signed)
Pt ambulated with no oxygen and sats was at 97% but as pt was going back to her room, pt's O2 sats drop to 87 % and HR at 134. Environmental manager.

## 2012-05-23 NOTE — ED Notes (Signed)
Patient removed from O2 per Dr. Radford Pax.

## 2012-05-24 ENCOUNTER — Inpatient Hospital Stay (HOSPITAL_COMMUNITY): Payer: Medicare Other

## 2012-05-24 ENCOUNTER — Encounter (HOSPITAL_COMMUNITY): Payer: Self-pay | Admitting: Oncology

## 2012-05-24 DIAGNOSIS — J189 Pneumonia, unspecified organism: Principal | ICD-10-CM

## 2012-05-24 DIAGNOSIS — J441 Chronic obstructive pulmonary disease with (acute) exacerbation: Secondary | ICD-10-CM | POA: Diagnosis present

## 2012-05-24 DIAGNOSIS — J45901 Unspecified asthma with (acute) exacerbation: Secondary | ICD-10-CM

## 2012-05-24 DIAGNOSIS — J9601 Acute respiratory failure with hypoxia: Secondary | ICD-10-CM | POA: Diagnosis present

## 2012-05-24 DIAGNOSIS — J9600 Acute respiratory failure, unspecified whether with hypoxia or hypercapnia: Secondary | ICD-10-CM

## 2012-05-24 DIAGNOSIS — N390 Urinary tract infection, site not specified: Secondary | ICD-10-CM

## 2012-05-24 LAB — EXPECTORATED SPUTUM ASSESSMENT W GRAM STAIN, RFLX TO RESP C

## 2012-05-24 LAB — BASIC METABOLIC PANEL
BUN: 13 mg/dL (ref 6–23)
GFR calc non Af Amer: 78 mL/min — ABNORMAL LOW (ref >90)
Glucose, Bld: 105 mg/dL — ABNORMAL HIGH (ref 70–99)
Potassium: 4 mEq/L (ref 3.5–5.1)

## 2012-05-24 LAB — URINALYSIS, ROUTINE W REFLEX MICROSCOPIC
Glucose, UA: NEGATIVE mg/dL
Hgb urine dipstick: NEGATIVE
Ketones, ur: NEGATIVE mg/dL
Protein, ur: NEGATIVE mg/dL

## 2012-05-24 LAB — CBC
Hemoglobin: 11.5 g/dL — ABNORMAL LOW (ref 12.0–15.0)
MCH: 29 pg (ref 26.0–34.0)
MCHC: 33.1 g/dL (ref 30.0–36.0)

## 2012-05-24 LAB — URINE MICROSCOPIC-ADD ON

## 2012-05-24 LAB — GLUCOSE, CAPILLARY: Glucose-Capillary: 106 mg/dL — ABNORMAL HIGH (ref 70–99)

## 2012-05-24 LAB — D-DIMER, QUANTITATIVE: D-Dimer, Quant: 1.45 ug/mL-FEU — ABNORMAL HIGH (ref 0.00–0.48)

## 2012-05-24 MED ORDER — IPRATROPIUM BROMIDE 0.02 % IN SOLN: 0.5000 mg | RESPIRATORY_TRACT | Status: DC | PRN

## 2012-05-24 MED ORDER — ALBUTEROL SULFATE (5 MG/ML) 0.5% IN NEBU
2.5000 mg | INHALATION_SOLUTION | Freq: Four times a day (QID) | RESPIRATORY_TRACT | Status: DC
  Administered 2012-05-24 (×2): 2.5 mg via RESPIRATORY_TRACT
  Filled 2012-05-24 (×3): qty 0.5

## 2012-05-24 MED ORDER — IOHEXOL 350 MG/ML SOLN
100.0000 mL | Freq: Once | INTRAVENOUS | Status: AC | PRN
  Administered 2012-05-24: 100 mL via INTRAVENOUS

## 2012-05-24 MED ORDER — ALBUTEROL SULFATE (5 MG/ML) 0.5% IN NEBU: 2.5000 mg | INHALATION_SOLUTION | RESPIRATORY_TRACT | Status: DC | PRN

## 2012-05-24 MED ORDER — IPRATROPIUM BROMIDE 0.02 % IN SOLN
0.5000 mg | Freq: Four times a day (QID) | RESPIRATORY_TRACT | Status: DC
  Administered 2012-05-24 (×2): 0.5 mg via RESPIRATORY_TRACT
  Filled 2012-05-24 (×3): qty 2.5

## 2012-05-24 NOTE — Evaluation (Signed)
Physical Therapy Evaluation Patient Details Name: Alicia Kent MRN: [id number] DOB: 03/03/1931 Today's Date: 05/24/2012 Time: 4098-1191 PT Time Calculation (min): 30 min  PT Assessment / Plan / Recommendation Clinical Impression  79 yo female admitted with SOB and inability to stand.  Pt has HHPT sevices in place and daughter available 24/7 . She needs assist for bed mobility , but was able to walk with min assist .She had mild dyspnea, but O2 sats > 92 % throughout.   She may need a 2 wheeled walker for more stabiility and reocommend continued HHPT at home    PT Assessment  Patient needs continued PT services    Follow Up Recommendations  Home health PT    Does the patient have the potential to tolerate intense rehabilitation      Barriers to Discharge        Equipment Recommendations  Rolling walker with 5" wheels    Recommendations for Other Services     Frequency Min 3X/week    Precautions / Restrictions Precautions Precautions: Fall Restrictions Weight Bearing Restrictions: No   Pertinent Vitals/Pain Intermittent back pain      Mobility  Bed Mobility Bed Mobility: Rolling Right;Rolling Left Rolling Right: 3: Mod assist Rolling Left: 3: Mod assist Details for Bed Mobility Assistance: pt needs assist to bend up leg to push to initiate rolling and to reach over to opposite bedrail to complete roll Transfers Transfers: Sit to Stand;Stand to Sit Sit to Stand: 4: Min assist Stand to Sit: 4: Min assist Details for Transfer Assistance: pt needed cues to push up with arms and she was able to complete sit to stand better Ambulation/Gait Ambulation/Gait Assistance: 4: Min assist Ambulation Distance (Feet): 75 Feet Assistive device: 4-wheeled walker Ambulation/Gait Assistance Details: pt needs cues to keep trunk extended during gait.  She did not use brakes of rollator well.  It appeared she needs more stabillity than 4 wheeled walker can offer.  Recommend 2 wheeled walker  next  visit,.  Pt with some dyspnea after ambulation but O2 sats remained > 2% throughout Gait Pattern: Decreased step length - right;Decreased step length - left;Trunk flexed;Right flexed knee in stance;Left flexed knee in stance Gait velocity: slightly uncontolled with swivel wheels on 4 wheeled walker General Gait Details: pt does not appear steady with 4 wheeled walker and maintains a flexed posture with it, pushing it too far out in front Stairs: No Wheelchair Mobility Wheelchair Mobility: No    Exercises General Exercises - Lower Extremity Ankle Circles/Pumps: AROM;Both;5 reps;Supine Quad Sets: AROM;Both;5 reps;Standing Gluteal Sets: AROM;Both;5 reps;Standing Other Exercises Other Exercises: deep breathing Other Exercises: trunk extension activities in sitting and standing Other Exercises: bilateral UE hip to hip for core activiation   PT Diagnosis: Difficulty walking;Generalized weakness  PT Problem List: Decreased strength;Decreased activity tolerance;Decreased balance PT Treatment Interventions: Gait training;Functional mobility training;Therapeutic activities;Therapeutic exercise;Balance training;Patient/family education   PT Goals Acute Rehab PT Goals PT Goal Formulation: With patient Time For Goal Achievement: 06/07/12 Potential to Achieve Goals: Good Pt will Roll Supine to Right Side: with modified independence;with rail PT Goal: Rolling Supine to Right Side - Progress: Goal set today Pt will Roll Supine to Left Side: with modified independence;with rail PT Goal: Rolling Supine to Left Side - Progress: Goal set today Pt will go Supine/Side to Sit: with modified independence;with rail PT Goal: Supine/Side to Sit - Progress: Goal set today Pt will go Sit to Supine/Side: with modified independence;with rail PT Goal: Sit to Supine/Side - Progress: Goal set  today Pt will go Sit to Stand: with modified independence PT Goal: Sit to Stand - Progress: Goal set today Pt will go  Stand to Sit: with modified independence PT Goal: Stand to Sit - Progress: Goal set today Pt will Ambulate: 51 - 150 feet;with modified independence;with rolling walker PT Goal: Ambulate - Progress: Goal set today  Visit Information  Last PT Received On: 05/24/12 Assistance Needed: +1    Subjective Data  Subjective: "I take therapy at home"  I was SOB and I couldn't get up from my chair.  Its a lift chair and I could't get  up" Patient Stated Goal: WALK normal!   Prior Functioning  Home Living Lives With: Daughter Available Help at Discharge: Family;Available 24 hours/day Type of Home: House Home Access: Level entry;Ramped entrance Home Layout: Multi-level;Able to live on main level with bedroom/bathroom Alternate Level Stairs-Number of Steps: "its made funny!  Pt reports house has multi level but she can stay on one level Home Adaptive Equipment: Dan Humphreys - four wheeled;Other (comment);Bedside commode/3-in-1;Wheelchair - manual;Shower chair with back (hospital bed) Prior Function Level of Independence: Independent with assistive device(s) Driving: No Communication Communication: No difficulties Dominant Hand: Right    Cognition  Cognition Overall Cognitive Status: Appears within functional limits for tasks assessed/performed Arousal/Alertness: Awake/alert Orientation Level: Appears intact for tasks assessed Behavior During Session: [facility] for tasks performed    Extremity/Trunk Assessment Right Lower Extremity Assessment RLE ROM/Strength/Tone: Deficits RLE ROM/Strength/Tone Deficits: generalized weakness/muscle atrophy . strenghth ~ 4/5  Left Lower Extremity Assessment LLE ROM/Strength/Tone: Deficits LLE ROM/Strength/Tone Deficits: generalized weakness/ muscle atrophy. strength ~ 4/5 Trunk Assessment Trunk Assessment: Other exceptions Trunk Exceptions: obese abdomen   Balance Balance Balance Assessed: Yes Static Sitting Balance Static Sitting - Balance Support: No upper  extremity supported Static Sitting - Level of Assistance: 7: Independent Static Standing Balance Static Standing - Balance Support: Bilateral upper extremity supported Static Standing - Level of Assistance: 5: Stand by assistance Static Standing - Comment/# of Minutes: > 60 seconds.    End of Session PT - End of Session Equipment Utilized During Treatment: Gait belt Activity Tolerance: Patient tolerated treatment well Patient left: in chair Nurse Communication: Mobility status  GP   Bayard Hugger. [place], [place] [phone] 05/24/2012, 5:23 PM

## 2012-05-24 NOTE — Progress Notes (Signed)
[facility] PROGRESS NOTE  Alicia Kent RUE:[id number] DOB: [birth year] DOA: 05/23/2012 PCP: Oliver Barre, MD  Brief narrative: 79 y.o. female with past medical history of HTN, depression, mild diastolic CHF, dementia who presented to [facility] ED 05/23/2012 with worsening shortness of breath associated with cough productive of whitish sputum. In ED, evaluation included CXR which showed COPD. Patient became hypoxic in ED and d dimer was ordered to evaluate for possible pulmonary embolism. Due to elevated D dimer CT anigo chest was subsequently done abut it was negative for pulmonary embolism. Thre was however a concern for possible infectious process/pneumonia in left lower lobe. Further, WBC count was elevated at 16.9 and urinalysis had trace leukocyte esterases.  Assessment/Plan:  Principal Problem:   Acute respiratory failure with hypoxia  Secondary to acute COPD exacerbation and pneumonia  CT angio chest ruled out pulmonary embolism.  Continue current nebulizer treatments scheduled and prn  Oxygen support via nasal canula to keep O2 saturation above 90%  Pneumonia order set in place; follow up sputum culture, blood cultures, legionella and strep pneumoniae results  Levaquin IV daily Active Problems:   Acute exacerbation of COPD with asthma  Management as above with nebulizer treatments and antibiotics   DIABETES MELLITUS, TYPE II  Continue current insulin regimen   HYPERLIPIDEMIA  Continue simvastatin   DEMENTIA  Continue aricept   DEPRESSION  Continue celexa   HYPERTENSION  Continue labetalol   Leukocytosis  Secondary to pneumonia and/or UTI   Diastolic CHF  Mildly elevated BNP at 719  2 D ECHO in 2013 with normal EF   UTI (urinary tract infection)  Continue  levaquin  Follow up urine culture results   CAP (community acquired pneumonia)  Management as above with Levaquin; follow up blood and resp culture results  Code Status: full code Family Communication:  no family at bedside Disposition Plan: home when stable  Manson Passey, MD  [facility] Pager [phone]  If 7PM-7AM, please contact night-coverage [email or web address] Password [email or web address] 05/24/2012, 11:11 AM   LOS: 1 day   Consultants:  None   Procedures:  None   Antibiotics:  Levaquin 05/23/2012  HPI/Subjective: No acute overnight events.  Objective: Filed Vitals:   05/24/12 0804 05/24/12 0845 05/24/12 1045 05/24/12 1057  BP:      Pulse:      Temp:      TempSrc:      Resp:      Height:      Weight: 78.019 kg (172 lb)     SpO2:  95% 100% 98%    Intake/Output Summary (Last 24 hours) at 05/24/12 1111 Last data filed at 05/24/12 0900  Gross per 24 hour  Intake      0 ml  Output    200 ml  Net   -200 ml    Exam:   General:  Pt is alert, follows commands appropriately, not in acute distress  Cardiovascular: Regular rate and rhythm, S1/S2, no murmurs, no rubs, no gallops  Respiratory: diminished brewth sounds, wheezing in upper lobes  Abdomen: Soft, non tender, non distended, bowel sounds present, no guarding  Extremities: No edema, pulses DP and PT palpable bilaterally  Neuro: Grossly nonfocal  Data Reviewed: Basic Metabolic Panel:  Recent Labs Lab 05/23/12 2000 05/24/12 0505  NA 132* 135  K 4.2 4.0  CL 97 100  CO2 23 24  GLUCOSE 159* 105*  BUN 10 13  CREATININE 0.65 0.77  CALCIUM 9.2 8.8   Liver Function Tests: No results found for this  basename: AST, ALT, ALKPHOS, BILITOT, PROT, ALBUMIN,  in the last 168 hours No results found for this basename: LIPASE, AMYLASE,  in the last 168 hours No results found for this basename: AMMONIA,  in the last 168 hours CBC:  Recent Labs Lab 05/23/12 2000 05/24/12 0505  WBC 16.9* 13.1*  NEUTROABS 14.1*  --   HGB 12.9 11.5*  HCT 38.6 34.7*  MCV 87.1 87.4  PLT 346 323   Cardiac Enzymes:  Recent Labs Lab 05/24/12 0012 05/24/12 0505  TROPONINI <0.30 <0.30   BNP: No components found with this basename: POCBNP,   CBG: No results found for this basename: GLUCAP,  in the last 168 hours  No results found for this or any previous visit (from the past 240 hour(s)).   Studies: Dg Chest 2 View 05/23/2012  *IMPRESSION: COPD.  No acute cardiopulmonary abnormality.   Original Report Authenticated By: Janeece Riggers, M.D.    Ct Angio Chest Pe W/cm &/or Wo Cm 05/24/2012   IMPRESSION:  1. No pulmonary embolus is noted. 2.  Mild infrahilar peribronchial thickening suspicious for bronchitic changes.  There is patchy airspace disease in the left lower lobe posteriorly highly suspicious for infiltrate/pneumonia. 3.  No adenopathy.  4.  There is mild compression deformity superior endplate of the T4 vertebral body.  This is probable chronic in nature.  Stable moderate compression fracture of T12 vertebral body.  Mild spinal canal stenosis at T12 level.  5.  Stable probable adenoma left adrenal gland.   Original Report Authenticated By: Natasha Mead, M.D.     Scheduled Meds: . albuterol  2.5 mg Nebulization Q6H WA  . aspirin EC  81 mg Oral q morning - 10a  . citalopram  20 mg Oral q morning - 10a  . donepezil  10 mg Oral q morning - 10a  . enoxaparin (LOVENOX)   40 mg Subcutaneous QHS  . famotidine  40 mg Oral q morning - 10a  . fluticasone  2 spray Each Nare Daily  . guaiFENesin  600 mg Oral BID  . insulin glargine  45 Units Subcutaneous QHS  . ipratropium  0.5 mg Nebulization Q6H WA  . labetalol  200 mg Oral BID  . levofloxacin (LEVAQUIN)  750 mg Intravenous QHS  . memantine  5 mg Oral BID  . montelukast  10 mg Oral QHS  . NIFEdipine  30 mg Oral q morning - 10a  . risperiDONE  1 mg Oral QHS  . simvastatin  40 mg Oral QHS

## 2012-05-25 LAB — URINE CULTURE: Colony Count: 4000

## 2012-05-25 LAB — GLUCOSE, CAPILLARY
Glucose-Capillary: 67 mg/dL — ABNORMAL LOW (ref 70–99)
Glucose-Capillary: 97 mg/dL (ref 70–99)

## 2012-05-25 MED ORDER — ALBUTEROL SULFATE (5 MG/ML) 0.5% IN NEBU
2.5000 mg | INHALATION_SOLUTION | Freq: Three times a day (TID) | RESPIRATORY_TRACT | Status: DC
  Administered 2012-05-25 – 2012-05-26 (×4): 2.5 mg via RESPIRATORY_TRACT
  Filled 2012-05-25 (×4): qty 0.5

## 2012-05-25 MED ORDER — IPRATROPIUM BROMIDE 0.02 % IN SOLN
0.5000 mg | Freq: Three times a day (TID) | RESPIRATORY_TRACT | Status: DC
  Administered 2012-05-25 – 2012-05-26 (×4): 0.5 mg via RESPIRATORY_TRACT
  Filled 2012-05-25 (×4): qty 2.5

## 2012-05-25 NOTE — Progress Notes (Signed)
Physical Therapy Treatment Patient Details Name: Alicia Kent MRN: [id number] DOB: 1931-02-06 Today's Date: 05/25/2012 Time: 4098-1191 PT Time Calculation (min): 17 min  PT Assessment / Plan / Recommendation Comments on Treatment Session  Pt brushed hair and teeth standing at sink in bathroom then ambulated short distance in hallway however fatigued quickly requesting recliner.    Follow Up Recommendations  Home health PT     Does the patient have the potential to tolerate intense rehabilitation     Barriers to Discharge        Equipment Recommendations  Rolling walker with 5" wheels    Recommendations for Other Services    Frequency     Plan Discharge plan remains appropriate;Frequency remains appropriate    Precautions / Restrictions Precautions Precautions: Fall   Pertinent Vitals/Pain C/o back pain after standing, repositioned in recliner    Mobility  Bed Mobility Bed Mobility: Rolling Left;Left Sidelying to Sit Rolling Left: 1: +2 Total assist Rolling Left: Patient Percentage: 20% Left Sidelying to Sit: 1: +2 Total assist Left Sidelying to Sit: Patient Percentage: 20% Details for Bed Mobility Assistance: she reports son in law in gets her OOB- and she has a hospital bed; verbal cues for technique and increased assist required (?motivation of pt) Transfers Transfers: Sit to Stand;Stand to Sit Sit to Stand: 4: Min assist;From chair/3-in-1;With upper extremity assist Stand to Sit: 4: Min assist;To chair/3-in-1;With upper extremity assist Details for Transfer Assistance: verbal cues for technique, assist to rise and steady and controlling descent Ambulation/Gait Ambulation/Gait Assistance: 4: Min assist Ambulation Distance (Feet): 50 Feet Assistive device: Rolling walker Ambulation/Gait Assistance Details: initially min assist to ambulate into bathroom however min/guard while in hallway,  verbal cues for posture however pt did c/o back pain with standing at sink in  bathroom,  pt fatigues quickly and requested recliner, also reports SOB with SaO2 97% room air. Gait Pattern: Decreased stride length;Trunk flexed;Right flexed knee in stance;Left flexed knee in stance Gait velocity: decreased    Exercises     PT Diagnosis:    PT Problem List:   PT Treatment Interventions:     PT Goals Acute Rehab PT Goals PT Goal: Rolling Supine to Left Side - Progress: Progressing toward goal PT Goal: Supine/Side to Sit - Progress: Progressing toward goal PT Goal: Sit to Stand - Progress: Progressing toward goal PT Goal: Stand to Sit - Progress: Progressing toward goal PT Goal: Ambulate - Progress: Progressing toward goal  Visit Information  Last PT Received On: 05/25/12 Assistance Needed: +1 (+2 for chair following)    Subjective Data  Subjective: I need to sit now.  (during ambulation)   Cognition  Cognition Overall Cognitive Status: Appears within functional limits for tasks assessed/performed Arousal/Alertness: Awake/alert Orientation Level: Appears intact for tasks assessed Behavior During Session: [facility] for tasks performed    Balance  Balance Balance Assessed: Yes Static Standing Balance Static Standing - Balance Support: Left upper extremity supported;During functional activity Static Standing - Level of Assistance: 5: Stand by assistance Static Standing - Comment/# of Minutes: pt able to stand in front of sink with RW and brush hair and then teeth approx 4-5 min standing  End of Session PT - End of Session Activity Tolerance: Patient limited by fatigue Patient left: in chair;with call bell/phone within reach;with chair alarm set Nurse Communication: Mobility status   GP     Kilo Eshelman,KATHrine E 05/25/2012, 2:50 PM Zenovia Jarred, PT, DPT 05/25/2012 Pager: [phone]

## 2012-05-25 NOTE — Evaluation (Signed)
Occupational Therapy Evaluation Patient Details Name: Alicia Kent MRN: [id number] DOB: 07/06/1930 Today's Date: 05/25/2012 Time: 4098-1191 OT Time Calculation (min): 23 min  OT Assessment / Plan / Recommendation Clinical Impression  79 yo female admitted with SOB and inability to stand.  Pt has HHPT sevices in place and daughter available 24/7 . She needs assist for bed mobility , but was able to walk with min assist . Pt will benefit from skilled OT to increase I with ADL activity and return to PLOF    OT Assessment  Patient needs continued OT Services    Follow Up Recommendations  Home health OT       Equipment Recommendations  None recommended by OT       Frequency  Min 2X/week    Precautions / Restrictions Precautions Precautions: Fall       ADL  Grooming: Performed;Wash/dry hands;Teeth care;Brushing hair;Minimal assistance Where Assessed - Grooming: Unsupported standing Upper Body Dressing: Performed;Set up Where Assessed - Upper Body Dressing: Unsupported sitting Lower Body Dressing: Simulated;Maximal assistance Where Assessed - Lower Body Dressing: Unsupported sit to stand Toilet Transfer: Simulated;Minimal assistance Toilet Transfer Method: Sit to stand;Other (comment) (bed to chair after walking) Toileting - Clothing Manipulation and Hygiene: Simulated Where Assessed - Toileting Clothing Manipulation and Hygiene: Standing Tub/Shower Transfer: Minimal assistance    OT Diagnosis: Generalized weakness  OT Treatment Interventions: Patient/family education   OT Goals Acute Rehab OT Goals OT Goal Formulation: With patient Time For Goal Achievement: 06/01/12 Potential to Achieve Goals: Good ADL Goals Pt Will Perform Grooming: with supervision;Standing at sink ADL Goal: Grooming - Progress: Goal set today Pt Will Perform Lower Body Dressing: with supervision;Sit to stand from bed ADL Goal: Lower Body Dressing - Progress: Goal set today Pt Will Transfer to Toilet:  with supervision;Comfort height toilet ADL Goal: Toilet Transfer - Progress: Goal set today Pt Will Perform Toileting - Clothing Manipulation: with supervision;Standing ADL Goal: Toileting - Clothing Manipulation - Progress: Goal set today  Visit Information  Last OT Received On: 06/01/12    Subjective Data  Subjective: I take therapy at home Patient Stated Goal: get home   Prior Functioning     Home Living Lives With: Daughter Available Help at Discharge: Family;Available 24 hours/day Type of Home: House Home Access: Level entry;Ramped entrance Home Layout: Multi-level;Able to live on main level with bedroom/bathroom Alternate Level Stairs-Number of Steps: "its made funny!  Pt reports house has multi level but she can stay on one level Bathroom Shower/Tub: Health visitor: Standard Home Adaptive Equipment: Environmental consultant - four wheeled;Other (comment);Bedside commode/3-in-1;Wheelchair - manual;Shower chair with back (hospital bed) Prior Function Level of Independence: Independent with assistive device(s) Driving: No Communication Communication: No difficulties Dominant Hand: Right               Extremity/Trunk Assessment Right Upper Extremity Assessment RUE ROM/Strength/Tone: WFL for tasks assessed Left Upper Extremity Assessment LUE ROM/Strength/Tone: WFL for tasks assessed Right Lower Extremity Assessment RLE ROM/Strength/Tone Deficits: generalized weakness - but pt able to comb hair and brush teeth standing at sink using BUE     Mobility Bed Mobility Bed Mobility: Rolling Left;Left Sidelying to Sit Rolling Left: 1: +2 Total assist Rolling Left: Patient Percentage: 20% Left Sidelying to Sit: 1: +2 Total assist Left Sidelying to Sit: Patient Percentage: 20% Details for Bed Mobility Assistance: she reports son in law in gets her OOB- and she has a hospital bed Transfers Transfers: Sit to Stand;Stand to Sit Sit to Stand: 4: Min assist;From  chair/3-in-1;With upper extremity assist Stand to Sit: 4: Min assist;To chair/3-in-1;With upper extremity assist           End of Session  Pt left in bed with call bell and phone with in reach. Educated to call for A when she wants to get up  GO     Lochlin Eppinger, [place] Kung 05/25/2012, 2:39 PM

## 2012-05-25 NOTE — Care Management Note (Addendum)
    Page 1 of 2   05/26/2012     2:57:54 PM   CARE MANAGEMENT NOTE 05/26/2012  Patient:  [facility]   Account Number:  [id number]  Date Initiated:  05/25/2012  Documentation initiated by:  Lanier Clam  Subjective/Objective Assessment:   ADMITTED W/SOB.UE:AVWU,JWJXBJ,YNW.     Action/Plan:   FROM HOME W/DTR.HAS CANE,RW,W/C,3N1.USED [facility] IN PAST.   Anticipated DC Date:  05/26/2012   Anticipated DC Plan:  HOME W HOME HEALTH SERVICES      DC Planning Services  CM consult      [facility]  Resumption Of Svcs/PTA Provider   Choice offered to / List presented to:  C-1 Patient   DME arranged  NEBULIZER MACHINE      DME agency  [facility]     HH arranged  HH-2 PT  HH-3 OT      [facility] agency  [facility]   Status of service:  Completed, signed off Medicare Important Message given?   (If response is "NO", the following Medicare IM given date fields will be blank) Date Medicare IM given:   Date Additional Medicare IM given:    Discharge Disposition:  HOME W HOME HEALTH SERVICES  Per UR Regulation:  Reviewed for med. necessity/level of care/duration of stay  If discussed at Long Length of Stay Meetings, dates discussed:    Comments:  05/26/12 Ja Pistole RN,BSN NCM [phone] PATIENT ALREADY D/C BEFORE NEB MACHINE BROUGHT TO RM.[facility] DME [place](REP) AWARE OF ALREADY D/C,& WILL DELIVER TO PATIENT'S HOME. ORDERED FOR HOME NEBS MACHINE.SCRIPT PUT IN FOR MED.[facility] DME [place](REP) AWARE & FOLLOWING. D/C HOME W/[facility] AWARE OF D/C,& HHPT/OT ORDERS.  05/25/12 Munir Victorian RN,BSN NCM [phone] PATIENT IN AGREEMENT TO [facility] FOR HH.[facility] (REP) FOLLOWING FOR HHPT/OT ALREADY ORDERED.

## 2012-05-25 NOTE — Progress Notes (Signed)
[facility] PROGRESS NOTE  Jury Caserta NWG:[id number] DOB: 07/28/1930 DOA: 05/23/2012 PCP: Oliver Barre, MD  Brief narrative: 79 y.o. female with past medical history of HTN, depression, mild diastolic CHF, dementia who presented to [facility] ED 05/23/2012 with worsening shortness of breath associated with cough productive of whitish sputum.  In ED, evaluation included CXR which showed COPD. Patient became hypoxic in ED and d dimer was ordered to evaluate for possible pulmonary embolism. Due to elevated D dimer CT anigo chest was subsequently done abut it was negative for pulmonary embolism. Thre was however a concern for possible infectious process/pneumonia in left lower lobe. Further, WBC count was elevated at 16.9 and urinalysis had trace leukocyte esterases.   Assessment/Plan:   Principal Problem:  Acute respiratory failure with hypoxia  Secondary to acute COPD exacerbation and pneumonia  CT angio chest ruled out pulmonary embolism.  Continue current nebulizer treatments scheduled and prn  Oxygen support via nasal canula to keep O2 saturation above 90%  Blood cultures negative to date. Respiratory culture negative. Legionella and strep pneumonia are still pending Continue Levaquin IV daily. Active Problems:  Acute exacerbation of COPD with asthma  Management as above with nebulizer treatments and antibiotics DIABETES MELLITUS, TYPE II  Continue current insulin regimen HYPERLIPIDEMIA  Continue simvastatin DEMENTIA  Continue aricept DEPRESSION  Continue celexa HYPERTENSION  Continue labetalol Leukocytosis  Secondary to pneumonia and/or UTI Diastolic CHF  Mildly elevated BNP at 719  2 D ECHO in 2013 with normal EF UTI (urinary tract infection)  Continue levaquin  CAP (community acquired pneumonia)  Management as above with Levaquin   Code Status: full code  Family Communication: no family at bedside  Disposition Plan: home when stable   Manson Passey, MD  [facility]  Pager  [phone]    Consultants:  None  Procedures:  None  Antibiotics:  Levaquin 05/23/2012   If 7PM-7AM, please contact night-coverage [email or web address] Password [facility] 05/25/2012, 1:27 PM   LOS: 2 days    HPI/Subjective: No acute overnight events.  Objective: Filed Vitals:   05/24/12 1355 05/24/12 2252 05/25/12 0528 05/25/12 0830  BP: 142/53 119/51 149/64   Pulse: 62 68 72   Temp: 97.4 F (36.3 C) 98.4 F (36.9 C) 97.6 F (36.4 C)   TempSrc: Oral Oral Oral   Resp: 18 16 18    Height:      Weight:   77.7 kg (171 lb 4.8 oz)   SpO2: 98% 97% 94% 94%    Intake/Output Summary (Last 24 hours) at 05/25/12 1327 Last data filed at 05/25/12 0900  Gross per 24 hour  Intake    380 ml  Output   1000 ml  Net   -620 ml    Exam:   General:  Pt is alert, follows commands appropriately, not in acute distress  Cardiovascular: Regular rate and rhythm, S1/S2, no murmurs, no rubs, no gallops  Respiratory: Clear to auscultation bilaterally, no wheezing, no crackles, no rhonchi  Abdomen: Soft, non tender, non distended, bowel sounds present, no guarding  Extremities: No edema, pulses DP and PT palpable bilaterally  Neuro: Grossly nonfocal  Data Reviewed: Basic Metabolic Panel:  Recent Labs Lab 05/23/12 2000 05/24/12 0505  NA 132* 135  K 4.2 4.0  CL 97 100  CO2 23 24  GLUCOSE 159* 105*  BUN 10 13  CREATININE 0.65 0.77  CALCIUM 9.2 8.8   Liver Function Tests: No results found for this basename: AST, ALT, ALKPHOS, BILITOT, PROT, ALBUMIN,  in the last 168 hours  No results found for this basename: LIPASE, AMYLASE,  in the last 168 hours No results found for this basename: AMMONIA,  in the last 168 hours CBC:  Recent Labs Lab 05/23/12 2000 05/24/12 0505  WBC 16.9* 13.1*  NEUTROABS 14.1*  --   HGB 12.9 11.5*  HCT 38.6 34.7*  MCV 87.1 87.4  PLT 346 323   Cardiac Enzymes:  Recent Labs Lab 05/24/12 0012 05/24/12 0505 05/24/12 1145  TROPONINI <0.30 <0.30 <0.30    BNP: No components found with this basename: POCBNP,  CBG:  Recent Labs Lab 05/24/12 2331 05/25/12 0746 05/25/12 0809 05/25/12 1150  GLUCAP 106* 67* 97 74    Recent Results (from the past 240 hour(s))  CULTURE, BLOOD (ROUTINE X 2)     Status: None   Collection Time    05/24/12 12:33 PM      Result Value Range Status   Specimen Description BLOOD RIGHT ARM   Final   Special Requests BOTTLES DRAWN AEROBIC AND ANAEROBIC 5 CC EA   Final   Culture  Setup Time 05/24/2012 20:34   Final   Culture     Final   Value:        BLOOD CULTURE RECEIVED NO GROWTH TO DATE CULTURE WILL BE HELD FOR 5 DAYS BEFORE ISSUING A FINAL NEGATIVE REPORT   Report Status PENDING   Incomplete  CULTURE, BLOOD (ROUTINE X 2)     Status: None   Collection Time    05/24/12 12:34 PM      Result Value Range Status   Specimen Description BLOOD RIGHT HAND   Final   Special Requests BOTTLES DRAWN AEROBIC AND ANAEROBIC 2 CC EA   Final   Culture  Setup Time 05/24/2012 20:34   Final   Culture     Final   Value:        BLOOD CULTURE RECEIVED NO GROWTH TO DATE CULTURE WILL BE HELD FOR 5 DAYS BEFORE ISSUING A FINAL NEGATIVE REPORT   Report Status PENDING   Incomplete  CULTURE, EXPECTORATED SPUTUM-ASSESSMENT     Status: None   Collection Time    05/24/12 12:46 PM      Result Value Range Status   Specimen Description SPUTUM   Final   Special Requests NONE   Final   Sputum evaluation     Final   Value: THIS SPECIMEN IS ACCEPTABLE. RESPIRATORY CULTURE REPORT TO FOLLOW.   Report Status 05/24/2012 FINAL   Final  CULTURE, RESPIRATORY (NON-EXPECTORATED)     Status: None   Collection Time    05/24/12 12:46 PM      Result Value Range Status   Specimen Description SPUTUM   Final   Special Requests NONE   Final   Gram Stain PENDING   Incomplete   Culture NO GROWTH   Final   Report Status PENDING   Incomplete     Studies: Dg Chest 2 View 05/23/2012  *RADIOLOGY REPORT*  Clinical Data: Short of breath and cough  CHEST - 2  VIEW  Comparison: 04/21/2012  Findings: COPD with hyperinflation and prominent lung markings. Negative for pneumonia.  Negative for heart failure or effusion.  IMPRESSION: COPD.  No acute cardiopulmonary abnormality.   Original Report Authenticated By: Janeece Riggers, M.D.    Ct Angio Chest Pe W/cm &/or Wo Cm 05/24/2012  *RADIOLOGY REPORT*  Clinical Data: Elevated D-dimer  CT ANGIOGRAPHY CHEST  Technique:  Multidetector CT imaging of the chest using the standard protocol during bolus administration of intravenous  contrast. Multiplanar reconstructed images including MIPs were obtained and reviewed to evaluate the vascular anatomy.  Contrast: OMNIPAQUE IOHEXOL 350 MG/ML SOLN  Comparison: 07/12/2011.  Findings: Sagittal images of the spine shows diffuse osteopenia. Mild degenerative changes.  There is probable chronic mild compression deformity superior endplate of the T4 vertebral body.  Moderate compression fracture T12 vertebral body is stable from 07/12/2011. There is mild spinal canal stenosis due to bony protrusion at T12 level.  Atherosclerotic calcifications and atherosclerotic plaques are noted thoracic aorta.  No aortic aneurysm.  Visualized upper abdomen shows a probable adenoma  in  left adrenal gland measures 1.3 cm.  Stable from prior CT scan 03/07/2011.  The study is of excellent technical quality.  No pulmonary embolus is noted.  No mediastinal hematoma or adenopathy.  Heart size is within normal limits.  No pericardial effusion.  Mild left infrahilar peribronchial thickening is noted suspicious for bronchitic changes.  There is patchy airspace disease in the left lower lobe posteriorly highly suspicious for pneumonia.  No pulmonary nodules are noted.  No pulmonary edema. Mild emphysematous changes bilateral upper lobe.  IMPRESSION:  1. No pulmonary embolus is noted. 2.  Mild infrahilar peribronchial thickening suspicious for bronchitic changes.  There is patchy airspace disease in the left lower  lobe posteriorly highly suspicious for infiltrate/pneumonia. 3.  No adenopathy.  4.  There is mild compression deformity superior endplate of the T4 vertebral body.  This is probable chronic in nature.  Stable moderate compression fracture of T12 vertebral body.  Mild spinal canal stenosis at T12 level.  5.  Stable probable adenoma left adrenal gland.   Original Report Authenticated By: Natasha Mead, M.D.     Scheduled Meds: . albuterol  2.5 mg Nebulization TID  . aspirin EC  81 mg Oral q morning - 10a  . citalopram  20 mg Oral q morning - 10a  . donepezil  10 mg Oral q morning - 10a  . enoxaparin (LOVENOX)   40 mg Subcutaneous QHS  . famotidine  40 mg Oral q morning - 10a  . fluticasone  2 spray Each Nare Daily  . guaiFENesin  600 mg Oral BID  . insulin glargine  45 Units Subcutaneous QHS  . ipratropium  0.5 mg Nebulization TID  . labetalol  200 mg Oral BID  . levofloxacin (LEVAQUIN)   750 mg Intravenous QHS  . memantine  5 mg Oral BID  . montelukast  10 mg Oral QHS  . NIFEdipine  30 mg Oral q morning - 10a  . risperiDONE  1 mg Oral QHS  . simvastatin  40 mg Oral QHS  . sodium chloride  3 mL Intravenous Q12H  . sodium chloride  3 mL Intravenous Q12H

## 2012-05-26 ENCOUNTER — Telehealth: Payer: Self-pay | Admitting: Internal Medicine

## 2012-05-26 DIAGNOSIS — R296 Repeated falls: Secondary | ICD-10-CM

## 2012-05-26 DIAGNOSIS — F0390 Unspecified dementia without behavioral disturbance: Secondary | ICD-10-CM

## 2012-05-26 DIAGNOSIS — M549 Dorsalgia, unspecified: Secondary | ICD-10-CM

## 2012-05-26 LAB — LEGIONELLA ANTIGEN, URINE

## 2012-05-26 LAB — GLUCOSE, CAPILLARY: Glucose-Capillary: 104 mg/dL — ABNORMAL HIGH (ref 70–99)

## 2012-05-26 LAB — STREP PNEUMONIAE URINARY ANTIGEN: Strep Pneumo Urinary Antigen: NEGATIVE

## 2012-05-26 MED ORDER — ALBUTEROL SULFATE (5 MG/ML) 0.5% IN NEBU: 2.5000 mg | INHALATION_SOLUTION | RESPIRATORY_TRACT | Status: DC | PRN

## 2012-05-26 MED ORDER — LEVOFLOXACIN 750 MG PO TABS: 750.0000 mg | ORAL_TABLET | Freq: Every day | ORAL | Status: DC

## 2012-05-26 NOTE — Telephone Encounter (Signed)
Patient is scheduled for a hospital fu 06/05/12, her daughter is requesting an order for a nurse aid and physical therapy with [facility] to be placed prior to this visit, call daughter with any questions

## 2012-05-26 NOTE — Telephone Encounter (Signed)
Ok for verbal 

## 2012-05-26 NOTE — Discharge Summary (Signed)
Physician Discharge Summary  Alicia Kent WUJ:[id number] DOB: 1931-01-14 DOA: 05/23/2012  PCP: Oliver Barre, MD  Admit date: 05/23/2012 Discharge date: 05/26/2012  Recommendations for Outpatient Follow-up:  1. Continue Levaquin 750 mg daily for 5 days. 2. Followup with primary care physician in one to 2 weeks after discharge or sooner if symptoms persist.  Discharge Diagnoses:  Principal Problem:   Acute respiratory failure with hypoxia Active Problems:   Acute exacerbation of COPD with asthma   DIABETES MELLITUS, TYPE II   HYPERLIPIDEMIA   DEMENTIA   DEPRESSION   HYPERTENSION   Leukocytosis   Diastolic CHF   UTI (urinary tract infection)   CAP (community acquired pneumonia)  Discharge Condition: Medically stable for discharge home today. Order placed for home health PT  Diet recommendation: As tolerated  History of present illness:  79 y.o. female with past medical history of HTN, depression, mild diastolic CHF, dementia who presented to [facility] ED 05/23/2012 with worsening shortness of breath associated with cough productive of whitish sputum.  In ED, evaluation included CXR which showed COPD. Patient became hypoxic in ED and d dimer was ordered to evaluate for possible pulmonary embolism. Due to elevated D dimer CT anigo chest was subsequently done abut it was negative for pulmonary embolism. Thre was however a concern for possible infectious process/pneumonia in left lower lobe. Further, WBC count was elevated at 16.9 and urinalysis had trace leukocyte esterases.   Assessment/Plan:   Principal Problem:  Acute respiratory failure with hypoxia  Secondary to acute COPD exacerbation and pneumonia  CT angio chest ruled out pulmonary embolism.  A. continue current home medications, albuterol nebulizer as needed, Singulair and Flonase Blood cultures negative to date. Respiratory culture negative. Legionella and strep pneumonia negative. Continue Levaquin 750 mg daily for 5 days on  discharge. Active Problems:  Acute exacerbation of COPD with asthma  Management as above with nebulizer treatments and antibiotics DIABETES MELLITUS, TYPE II  Continue current insulin regimen HYPERLIPIDEMIA  Continue simvastatin DEMENTIA  Continue aricept DEPRESSION  Continue celexa HYPERTENSION  Continue labetalol Leukocytosis  Secondary to pneumonia and/or UTI Urine culture shows no growth. Blood cultures show no growth. Diastolic CHF  Mildly elevated BNP at 719  2 D ECHO in 2013 with normal EF UTI (urinary tract infection)  Continue levaquin for 5 days. CAP (community acquired pneumonia)  Management as above with Levaquin   Code Status: full code  Family Communication: no family at bedside   Manson Passey, MD  [facility]  Pager [phone]   Consultants:  Physical therapy Procedures:  None  Antibiotics:  Levaquin 05/23/2012 - continue for 5 days on discharge   Discharge Exam: Filed Vitals:   05/26/12 0958  BP: 154/74  Pulse:   Temp:   Resp:    Filed Vitals:   05/25/12 2147 05/26/12 0633 05/26/12 0940 05/26/12 0958  BP: 135/50 150/61  154/74  Pulse: 74 70    Temp: 97.7 F (36.5 C) 98 F (36.7 C)    TempSrc: Oral Oral    Resp:      Height:      Weight:  78.4 kg (172 lb 13.5 oz)    SpO2: 94% 95% 93%     General: Pt is alert, not in acute distress Cardiovascular: Regular rate and rhythm, S1/S2 +, no murmurs, no rubs, no gallops Respiratory: Clear to auscultation bilaterally, no wheezing, no crackles, no rhonchi Abdominal: Soft, non tender, non distended, bowel sounds +, no guarding Extremities: no edema, no cyanosis, pulses palpable bilaterally DP  and PT Neuro: Grossly nonfocal  Discharge Instructions  Discharge Orders   Future Appointments Provider Department Dept Phone   07/10/2012 3:30 PM Corwin Levins, MD [facility] [phone]   Future Orders Complete By Expires     Call MD for:  difficulty breathing, headache or visual  disturbances  As directed     Call MD for:  persistant dizziness or light-headedness  As directed     Call MD for:  persistant nausea and vomiting  As directed     Call MD for:  severe uncontrolled pain  As directed     Diet - low sodium heart healthy  As directed     Discharge instructions  As directed     Comments:      Continue Levaquin 750 mg daily for 5 days.    Increase activity slowly  As directed         Medication List    TAKE these medications       albuterol (5 MG/ML) 0.5% nebulizer solution  Commonly known as:  PROVENTIL  Take 0.5 mLs (2.5 mg total) by nebulization every 4 (four) hours as needed for wheezing or shortness of breath.     alendronate 70 MG tablet  Commonly known as:  FOSAMAX  Take 70 mg by mouth every Thursday.     aspirin 81 MG EC tablet  Take 81 mg by mouth every morning.     citalopram 20 MG tablet  Commonly known as:  CELEXA  Take 20 mg by mouth every morning.     donepezil 10 MG tablet  Commonly known as:  ARICEPT  Take 10 mg by mouth every morning.     famotidine 40 MG tablet  Commonly known as:  PEPCID  Take 40 mg by mouth every morning.     fluticasone 50 MCG/ACT nasal spray  Commonly known as:  FLONASE  Place 2 sprays into the nose daily as needed. For allergies.     insulin glargine 100 UNIT/ML injection  Commonly known as:  LANTUS  Inject 45 Units into the skin at bedtime.     labetalol 200 MG tablet  Commonly known as:  NORMODYNE  Take 200 mg by mouth 2 (two) times daily.     levofloxacin 750 MG tablet  Commonly known as:  LEVAQUIN  Take 1 tablet (750 mg total) by mouth daily.     memantine 5 MG tablet  Commonly known as:  NAMENDA  Take 5 mg by mouth 2 (two) times daily.     metFORMIN 500 MG 24 hr tablet  Commonly known as:  GLUCOPHAGE-XR  Take 1,500 mg by mouth every morning.     montelukast 10 MG tablet  Commonly known as:  SINGULAIR  Take 10 mg by mouth at bedtime.     NIFEdipine 30 MG 24 hr tablet  Commonly  known as:  PROCARDIA-XL/ADALAT-CC/NIFEDICAL-XL  Take 30 mg by mouth every morning.     risperiDONE 1 MG tablet  Commonly known as:  RISPERDAL  Take 1 mg by mouth at bedtime.     simvastatin 40 MG tablet  Commonly known as:  ZOCOR  Take 40 mg by mouth at bedtime.           Follow-up Information   Follow up with Oliver Barre, MD In 2 weeks.   Contact information:   [address] [place] [address] [phone]  The results of significant diagnostics from this hospitalization (including imaging, microbiology, ancillary and laboratory) are listed below for reference.    Significant Diagnostic Studies: Dg Chest 2 View  05/23/2012  *RADIOLOGY REPORT*  Clinical Data: Short of breath and cough  CHEST - 2 VIEW  Comparison: 04/21/2012  Findings: COPD with hyperinflation and prominent lung markings. Negative for pneumonia.  Negative for heart failure or effusion.  IMPRESSION: COPD.  No acute cardiopulmonary abnormality.   Original Report Authenticated By: Janeece Riggers, M.D.    Ct Angio Chest Pe W/cm &/or Wo Cm  05/24/2012  *RADIOLOGY REPORT*  Clinical Data: Elevated D-dimer  CT ANGIOGRAPHY CHEST  Technique:  Multidetector CT imaging of the chest using the standard protocol during bolus administration of intravenous contrast. Multiplanar reconstructed images including MIPs were obtained and reviewed to evaluate the vascular anatomy.  Contrast: OMNIPAQUE IOHEXOL 350 MG/ML SOLN  Comparison: 07/12/2011.  Findings: Sagittal images of the spine shows diffuse osteopenia. Mild degenerative changes.  There is probable chronic mild compression deformity superior endplate of the T4 vertebral body.  Moderate compression fracture T12 vertebral body is stable from 07/12/2011. There is mild spinal canal stenosis due to bony protrusion at T12 level.  Atherosclerotic calcifications and atherosclerotic plaques are noted thoracic aorta.  No aortic aneurysm.  Visualized  upper abdomen shows a probable adenoma  in  left adrenal gland measures 1.3 cm.  Stable from prior CT scan 03/07/2011.  The study is of excellent technical quality.  No pulmonary embolus is noted.  No mediastinal hematoma or adenopathy.  Heart size is within normal limits.  No pericardial effusion.  Mild left infrahilar peribronchial thickening is noted suspicious for bronchitic changes.  There is patchy airspace disease in the left lower lobe posteriorly highly suspicious for pneumonia.  No pulmonary nodules are noted.  No pulmonary edema. Mild emphysematous changes bilateral upper lobe.  IMPRESSION:  1. No pulmonary embolus is noted. 2.  Mild infrahilar peribronchial thickening suspicious for bronchitic changes.  There is patchy airspace disease in the left lower lobe posteriorly highly suspicious for infiltrate/pneumonia. 3.  No adenopathy.  4.  There is mild compression deformity superior endplate of the T4 vertebral body.  This is probable chronic in nature.  Stable moderate compression fracture of T12 vertebral body.  Mild spinal canal stenosis at T12 level.  5.  Stable probable adenoma left adrenal gland.   Original Report Authenticated By: Natasha Mead, M.D.     Microbiology: Recent Results (from the past 240 hour(s))  URINE CULTURE     Status: None   Collection Time    05/24/12  8:04 AM      Result Value Range Status   Specimen Description URINE, RANDOM   Final   Special Requests NONE   Final   Culture  Setup Time 05/24/2012 18:06   Final   Colony Count 4,000 COLONIES/ML   Final   Culture INSIGNIFICANT GROWTH   Final   Report Status 05/25/2012 FINAL   Final  CULTURE, BLOOD (ROUTINE X 2)     Status: None   Collection Time    05/24/12 12:33 PM      Result Value Range Status   Specimen Description BLOOD RIGHT ARM   Final   Special Requests BOTTLES DRAWN AEROBIC AND ANAEROBIC 5 CC EA   Final   Culture  Setup Time 05/24/2012 20:34   Final   Culture     Final   Value:        BLOOD CULTURE  RECEIVED NO GROWTH TO DATE CULTURE WILL BE HELD FOR 5 DAYS BEFORE ISSUING A FINAL NEGATIVE REPORT   Report Status PENDING   Incomplete  CULTURE, BLOOD (ROUTINE X 2)     Status: None   Collection Time    05/24/12 12:34 PM      Result Value Range Status   Specimen Description BLOOD RIGHT HAND   Final   Special Requests BOTTLES DRAWN AEROBIC AND ANAEROBIC 2 CC EA   Final   Culture  Setup Time 05/24/2012 20:34   Final   Culture     Final   Value:        BLOOD CULTURE RECEIVED NO GROWTH TO DATE CULTURE WILL BE HELD FOR 5 DAYS BEFORE ISSUING A FINAL NEGATIVE REPORT   Report Status PENDING   Incomplete  CULTURE, EXPECTORATED SPUTUM-ASSESSMENT     Status: None   Collection Time    05/24/12 12:46 PM      Result Value Range Status   Specimen Description SPUTUM   Final   Special Requests NONE   Final   Sputum evaluation     Final   Value: THIS SPECIMEN IS ACCEPTABLE. RESPIRATORY CULTURE REPORT TO FOLLOW.   Report Status 05/24/2012 FINAL   Final  CULTURE, RESPIRATORY (NON-EXPECTORATED)     Status: None   Collection Time    05/24/12 12:46 PM      Result Value Range Status   Specimen Description SPUTUM   Final   Special Requests NONE   Final   Gram Stain     Final   Value: RARE WBC PRESENT,BOTH PMN AND MONONUCLEAR     RARE SQUAMOUS EPITHELIAL CELLS PRESENT     FEW GRAM POSITIVE COCCI     IN PAIRS   Culture NORMAL OROPHARYNGEAL FLORA   Final   Report Status PENDING   Incomplete     Labs: Basic Metabolic Panel:  Recent Labs Lab 05/23/12 2000 05/24/12 0505  NA 132* 135  K 4.2 4.0  CL 97 100  CO2 23 24  GLUCOSE 159* 105*  BUN 10 13  CREATININE 0.65 0.77  CALCIUM 9.2 8.8   Liver Function Tests: No results found for this basename: AST, ALT, ALKPHOS, BILITOT, PROT, ALBUMIN,  in the last 168 hours No results found for this basename: LIPASE, AMYLASE,  in the last 168 hours No results found for this basename: AMMONIA,  in the last 168 hours CBC:  Recent Labs Lab 05/23/12 2000  05/24/12 0505  WBC 16.9* 13.1*  NEUTROABS 14.1*  --   HGB 12.9 11.5*  HCT 38.6 34.7*  MCV 87.1 87.4  PLT 346 323   Cardiac Enzymes:  Recent Labs Lab 05/24/12 0012 05/24/12 0505 05/24/12 1145  TROPONINI <0.30 <0.30 <0.30   BNP: BNP (last 3 results)  Recent Labs  05/23/12 2000  PROBNP 719.0*   CBG:  Recent Labs Lab 05/24/12 2331 05/25/12 0746 05/25/12 0809 05/25/12 1150 05/25/12 2150  GLUCAP 106* 67* 97 74 104*    Time coordinating discharge: Over 30 minutes  Signed:  Manson Passey, MD  [facility]  05/26/2012, 11:19 AM  Pager #: [phone]

## 2012-05-27 ENCOUNTER — Telehealth: Payer: Self-pay | Admitting: Family Medicine

## 2012-05-27 NOTE — Telephone Encounter (Signed)
Patients daughter informed of order ok and they need referral for aid and PT through [facility]

## 2012-05-27 NOTE — Telephone Encounter (Signed)
Family informed.

## 2012-05-27 NOTE — Telephone Encounter (Signed)
Transitional Care Call: D/C date: 05/26/2012.  D/C dx: Acute respiratory failure with hypoxia.  Spoke with pt's daughter, [name].  States that patient is "tired and weak".  She has been coughing and they are waiting on nebulizer orders to be fixed so that they can get the right medication.  Pt ambulating with walker throughout the home, SOB during ambulation.  [facility] providing home health aide, PT and speech therapy.  Daughter has been in touch with them and would like for Dr. Jonny Ruiz to put an extension on the orders for home health as they run out soon.  Daughter verbalizes understanding about d/c instructions and med list.  Denies questions for Dr. Jonny Ruiz at this time.  Hospital f/u appt scheduled on 4/4 at 2:30.

## 2012-05-27 NOTE — Telephone Encounter (Signed)
Done per emr 

## 2012-05-27 NOTE — Telephone Encounter (Signed)
Ok for verbal regarding daughter requests to [facility], but only if felt approp per [facility]

## 2012-05-28 NOTE — Telephone Encounter (Signed)
Daughter informed of ok for extension.  Stated she would let [facility] know and they could call for ok.

## 2012-05-29 ENCOUNTER — Telehealth: Payer: Self-pay | Admitting: Internal Medicine

## 2012-05-29 NOTE — Telephone Encounter (Signed)
HHRN informed to start on Monday.

## 2012-05-29 NOTE — Telephone Encounter (Signed)
Ok for verbal 

## 2012-05-29 NOTE — Telephone Encounter (Signed)
Jae Dire from [facility] is calling to get orders faxed to resume PT, speech therapy, and to have home health nurse, orders can be faxed to [phone]. Call Jae Dire with questions at [phone]

## 2012-05-31 LAB — CULTURE, BLOOD (ROUTINE X 2): Culture: NO GROWTH

## 2012-06-01 ENCOUNTER — Telehealth: Payer: Self-pay | Admitting: Internal Medicine

## 2012-06-01 NOTE — Telephone Encounter (Signed)
Ok for verbal 

## 2012-06-01 NOTE — Telephone Encounter (Signed)
Diane called for an order for home care after her hosp. Stay.  Speech therapy, physical therapy and home nursing.

## 2012-06-02 NOTE — Telephone Encounter (Signed)
HHRN informed 

## 2012-06-04 ENCOUNTER — Telehealth: Payer: Self-pay

## 2012-06-04 NOTE — Telephone Encounter (Signed)
The patients daughter is requesting a consult with Hospice.  If ok will need order for hospice and ok for Dr. Jonny Ruiz to be attending.  Call Hospice, Olegario Messier [phone]

## 2012-06-04 NOTE — Telephone Encounter (Signed)
Ok for all, and ok for "hospice doctor" for routine reqeusts

## 2012-06-05 ENCOUNTER — Encounter: Payer: Self-pay | Admitting: Internal Medicine

## 2012-06-05 ENCOUNTER — Ambulatory Visit (INDEPENDENT_AMBULATORY_CARE_PROVIDER_SITE_OTHER): Payer: Medicare Other | Admitting: Internal Medicine

## 2012-06-05 VITALS — BP 110/72 | HR 67 | Temp 97.0°F | Wt 170.0 lb

## 2012-06-05 DIAGNOSIS — E119 Type 2 diabetes mellitus without complications: Secondary | ICD-10-CM

## 2012-06-05 DIAGNOSIS — J189 Pneumonia, unspecified organism: Secondary | ICD-10-CM

## 2012-06-05 DIAGNOSIS — J9601 Acute respiratory failure with hypoxia: Secondary | ICD-10-CM

## 2012-06-05 DIAGNOSIS — J441 Chronic obstructive pulmonary disease with (acute) exacerbation: Secondary | ICD-10-CM

## 2012-06-05 DIAGNOSIS — J449 Chronic obstructive pulmonary disease, unspecified: Secondary | ICD-10-CM | POA: Insufficient documentation

## 2012-06-05 MED ORDER — IPRATROPIUM-ALBUTEROL 0.5-2.5 (3) MG/3ML IN SOLN: 3.0000 mL | Freq: Four times a day (QID) | RESPIRATORY_TRACT | Status: DC | PRN

## 2012-06-05 NOTE — Telephone Encounter (Signed)
Hospice informed  

## 2012-06-05 NOTE — Assessment & Plan Note (Signed)
Resolved, no further need tx at this time

## 2012-06-05 NOTE — Patient Instructions (Signed)
OK to change the albuterol to the Duoneb No need for oxygen therapy at this time Please keep your appointments with your specialists as you have planned - Hospice Please continue all other medications as before, and refills have been done if requested. Please return in 3 months, or sooner if needed

## 2012-06-05 NOTE — Assessment & Plan Note (Signed)
Clinically improved, declines f/u cxr,  to f/u any worsening symptoms or concerns, finished antibx

## 2012-06-05 NOTE — Progress Notes (Signed)
Subjective:    Patient ID: Alicia Kent, female    DOB: 1931/01/11, 79 y.o.   MRN: [id number]  HPI  Here with daughter who lives with pt after recent hospn for left PNA, copd exac, acute resp failure, and UTI, in the setting of dementia, copd, DM.  Hx per daughter, who notes pt with significant fatigue, walks with walker at home only for few short steps and seems to have DOE, daughter ? Need for home o2 with exertion.   Pt denies polydipsia, polyuria, or low sugar symptoms such as weakness or confusion improved with po intake.  Pt states overall good compliance with meds.   Pt denies fever, wt loss, night sweats, loss of appetite, or other constitutional symptoms Past Medical History  Diagnosis Date  . Diabetes mellitus type II   . Hyperlipidemia   . HTN (hypertension)   . GERD (gastroesophageal reflux disease)   . Allergic rhinitis   . Depression   . Dementia     mild  . History of CVA (cerebrovascular accident)   . LBBB (left bundle branch block)   . AAA (abdominal aortic aneurysm)     small, last check in 05/2009, due for repeat in 05/2010  . Carotid stenosis 09/2009    40-59% RICA, 0-39% LICA  . LVH (left ventricular hypertrophy) 10/2009    Moderate, EF 55-60%, no regional wall motion abnormalities  . Shellfish allergy     anaphylaxis  . Osteopenia   . Vitamin D deficiency   . Fracture of rib of left side 12/2009  . Asthma 01/27/2011  . Diastolic CHF 08/02/2011   Past Surgical History  Procedure Laterality Date  . Ankle surgery      Right, by Ortho in [place]  . Tracheostomy  2010    anaphylactic episode on vent  . Tubal ligation      reports that she has been smoking.  She does not have any smokeless tobacco history on file. She reports that she does not drink alcohol or use illicit drugs. family history includes Diabetes type II in her other; Hypertension in her brother; Rheum arthritis in her sister; and Stroke in her brother. Allergies  Allergen Reactions  . Ibuprofen  Anaphylaxis  . Shellfish-Derived Products Anaphylaxis    Swelling tongue and throat  . Ace Inhibitors Other (See Comments)    unknown  . Sulfa Antibiotics Nausea And Vomiting   Current Outpatient Prescriptions on File Prior to Visit  Medication Sig Dispense Refill  . alendronate (FOSAMAX) 70 MG tablet Take 70 mg by mouth every Thursday.      Marland Kitchen aspirin 81 MG EC tablet Take 81 mg by mouth every morning.       . citalopram (CELEXA) 20 MG tablet Take 20 mg by mouth every morning.      . donepezil (ARICEPT) 10 MG tablet Take 10 mg by mouth every morning.      . famotidine (PEPCID) 40 MG tablet Take 40 mg by mouth every morning.      . fluticasone (FLONASE) 50 MCG/ACT nasal spray Place 2 sprays into the nose daily as needed. For allergies.      Marland Kitchen insulin glargine (LANTUS) 100 UNIT/ML injection Inject 45 Units into the skin at bedtime.      Marland Kitchen labetalol (NORMODYNE) 200 MG tablet Take 200 mg by mouth 2 (two) times daily.      . memantine (NAMENDA) 5 MG tablet Take 5 mg by mouth 2 (two) times daily.      Marland Kitchen  metFORMIN (GLUCOPHAGE-XR) 500 MG 24 hr tablet Take 1,500 mg by mouth every morning.      . montelukast (SINGULAIR) 10 MG tablet Take 10 mg by mouth at bedtime.      Marland Kitchen NIFEdipine (PROCARDIA-XL/ADALAT-CC/NIFEDICAL-XL) 30 MG 24 hr tablet Take 30 mg by mouth every morning.      . risperiDONE (RISPERDAL) 1 MG tablet Take 1 mg by mouth at bedtime.      . simvastatin (ZOCOR) 40 MG tablet Take 40 mg by mouth at bedtime.       No current facility-administered medications on file prior to visit.   Review of Systems  Constitutional: Negative for unexpected weight change, or unusual diaphoresis  HENT: Negative for tinnitus.   Eyes: Negative for photophobia and visual disturbance.  Respiratory: Negative for choking and stridor.   Gastrointestinal: Negative for vomiting and blood in stool.  Genitourinary: Negative for hematuria and decreased urine volume.  Musculoskeletal: Negative for acute joint  swelling Skin: Negative for color change and wound.  Neurological: Negative for tremors and numbness other than noted  Psychiatric/Behavioral: Negative for decreased concentration or  hyperactivity.       Objective:   Physical Exam BP 110/72  Pulse 67  Temp(Src) 97 F (36.1 C) (Oral)  Wt 170 lb (77.111 kg)  BMI 26.62 kg/m2  SpO2 94% VS noted, o2 at with ambulation 20 ft with assist - start 97%, finish 97%, too weak to tolerate further Constitutional: Pt appears well-developed and well-nourished.  HENT: Head: NCAT.  Right Ear: External ear normal.  Left Ear: External ear normal.  Eyes: Conjunctivae and EOM are normal. Pupils are equal, round, and reactive to light.  Neck: Normal range of motion. Neck supple.  Cardiovascular: Normal rate and regular rhythm.   Pulmonary/Chest: Effort normal and breath sounds decreased without rales or wheezing.  Abd:  Soft, NT, non-distended, + BS Neurological: Pt is alert. At baseline confusion  Skin: Skin is warm. No erythema.  Psychiatric: Pt behavior is normal..     Assessment & Plan:

## 2012-06-05 NOTE — Assessment & Plan Note (Signed)
Resolved, no need for further home o2 at this time

## 2012-06-05 NOTE — Assessment & Plan Note (Signed)
stable overall by history and exam, recent data reviewed with pt, and pt to continue medical treatment as before,  to f/u any worsening symptoms or concerns Lab Results  Component Value Date   HGBA1C 5.6 11/05/2011

## 2012-06-08 ENCOUNTER — Other Ambulatory Visit: Payer: Self-pay

## 2012-06-08 MED ORDER — IPRATROPIUM-ALBUTEROL 0.5-2.5 (3) MG/3ML IN SOLN: 3.0000 mL | Freq: Four times a day (QID) | RESPIRATORY_TRACT | Status: DC | PRN

## 2012-06-22 ENCOUNTER — Telehealth: Payer: Self-pay

## 2012-06-22 NOTE — Telephone Encounter (Signed)
Hospice informed  

## 2012-06-22 NOTE — Telephone Encounter (Signed)
Hospice called requesting MD authorize for Hospice MD signed a DNR form for pt per daughter's request. Verbal ok.

## 2012-06-22 NOTE — Telephone Encounter (Signed)
Ok for verbal 

## 2012-06-24 DIAGNOSIS — J9600 Acute respiratory failure, unspecified whether with hypoxia or hypercapnia: Secondary | ICD-10-CM

## 2012-06-24 DIAGNOSIS — J189 Pneumonia, unspecified organism: Secondary | ICD-10-CM

## 2012-06-24 DIAGNOSIS — J441 Chronic obstructive pulmonary disease with (acute) exacerbation: Secondary | ICD-10-CM

## 2012-06-24 DIAGNOSIS — E119 Type 2 diabetes mellitus without complications: Secondary | ICD-10-CM

## 2012-06-24 DIAGNOSIS — J45901 Unspecified asthma with (acute) exacerbation: Secondary | ICD-10-CM

## 2012-06-27 ENCOUNTER — Other Ambulatory Visit: Payer: Self-pay | Admitting: Internal Medicine

## 2012-06-29 ENCOUNTER — Telehealth: Payer: Self-pay

## 2012-06-29 MED ORDER — HYDROCODONE-ACETAMINOPHEN 5-325 MG PO TABS: 1.0000 | ORAL_TABLET | Freq: Three times a day (TID) | ORAL | Status: DC | PRN

## 2012-06-29 NOTE — Telephone Encounter (Signed)
Done hardcopy to robin  

## 2012-06-29 NOTE — Telephone Encounter (Signed)
[facility] requesting to change the hydrocodone 5/500 to 5/325 please advise

## 2012-06-29 NOTE — Telephone Encounter (Signed)
Faxed hardcopy to pharmacy. 

## 2012-07-01 ENCOUNTER — Telehealth: Payer: Self-pay

## 2012-07-01 NOTE — Telephone Encounter (Signed)
HHRN informed 

## 2012-07-01 NOTE — Telephone Encounter (Signed)
Hospice called to inform MD that pt is having some tracheal congestion with aspiration when she eats or drinks. Hospice is concerned that pt may need a thickener. Please advise

## 2012-07-01 NOTE — Telephone Encounter (Signed)
Ok for verbal 

## 2012-07-10 ENCOUNTER — Ambulatory Visit: Payer: Medicare Other | Admitting: Internal Medicine

## 2012-07-15 ENCOUNTER — Telehealth: Payer: Self-pay

## 2012-07-15 NOTE — Telephone Encounter (Signed)
Ok for verbal 

## 2012-07-15 NOTE — Telephone Encounter (Signed)
Hospice to inform the patient is still aspirating/coughing and hearing a rattle in her Trachea.  They are concerned for pneumonia . They would like a verbal order to use liquid thickener to her liquids

## 2012-07-15 NOTE — Telephone Encounter (Signed)
Called Hospice left detailed message of MD's verbal ok.

## 2012-07-24 ENCOUNTER — Telehealth: Payer: Self-pay

## 2012-07-24 DIAGNOSIS — R0902 Hypoxemia: Secondary | ICD-10-CM

## 2012-07-24 NOTE — Addendum Note (Signed)
Addended by: Corwin Levins on: 07/24/2012 02:50 PM   Modules accepted: Orders

## 2012-07-24 NOTE — Telephone Encounter (Signed)
Hospice called to inform the patient this am has been sleeping heavily.  Was difficult to wake up and slow to respond. BS was 109, BP 150/70 oxygen 93%.  The patient does speak but falls right back to sleep and HHRN noticed some sleep apnea.  Advise if they need to bring in some oxygen for this patient.  Please adviise. Call back number for hospice [phone]

## 2012-07-24 NOTE — Telephone Encounter (Signed)
I dont think so at this point, based on this information, but an ONO can be ordered to look into further

## 2012-07-24 NOTE — Telephone Encounter (Signed)
Hospice RN called back to request  PCP put in referral for ONO.  Please use her COPD ICD9 code in order for hospice to pay for this test.  Also when ordering should be payable by hospice medicare.

## 2012-07-24 NOTE — Telephone Encounter (Signed)
done

## 2012-07-24 NOTE — Telephone Encounter (Signed)
Informed hospice of MD information.

## 2012-08-05 ENCOUNTER — Telehealth: Payer: Self-pay

## 2012-08-05 DIAGNOSIS — R05 Cough: Secondary | ICD-10-CM

## 2012-08-05 NOTE — Telephone Encounter (Signed)
Hospice to inform the patient has some tracheal rattle and they are not using thickener.  She is having anterior and posterior rattle, not able to cough anything up.  The patient is eating well and alert.  Respiratory status is not good. They did not know if a chest xray would be necessary please advise

## 2012-08-05 NOTE — Telephone Encounter (Signed)
Ok for cxr - cough, but what is best way to get done?

## 2012-08-06 ENCOUNTER — Ambulatory Visit (INDEPENDENT_AMBULATORY_CARE_PROVIDER_SITE_OTHER)
Admission: RE | Admit: 2012-08-06 | Discharge: 2012-08-06 | Disposition: A | Discharge status: Home / Self Care | Source: Ambulatory Visit | Attending: Internal Medicine | Admitting: Internal Medicine

## 2012-08-06 DIAGNOSIS — R05 Cough: Secondary | ICD-10-CM

## 2012-08-06 NOTE — Telephone Encounter (Signed)
Done per emr 

## 2012-08-06 NOTE — Telephone Encounter (Signed)
Informed Hospice for cxr.  Hospice informed the patients daughter can bring her in please put order in

## 2012-08-12 ENCOUNTER — Telehealth: Payer: Self-pay

## 2012-08-12 MED ORDER — AZITHROMYCIN 250 MG PO TABS: ORAL_TABLET | ORAL | Status: DC

## 2012-08-12 NOTE — Telephone Encounter (Signed)
Ok for zpack  I dont see SSI listed as one of her meds

## 2012-08-12 NOTE — Telephone Encounter (Signed)
Hospice called to inform the patient is going to [facility] on June 19 for respite care.  She is requesting a written exact order for her slidding scale instructions to attach to med list going to [facility].   Also patient still has a loud rattle in her bronchials.  The daughter is now using the thickener.  Fax number to send order is [phone]/  Call back number to [facility] is [phone]

## 2012-08-12 NOTE — Telephone Encounter (Signed)
Hospice informed and they also informed regarding the sliding scale is that the patients daughter has been doing it her way please advise

## 2012-08-12 NOTE — Telephone Encounter (Signed)
Hospice called to inform the daughter stated the patient has coughed up a large piece of grey sputum.

## 2012-08-12 NOTE — Telephone Encounter (Signed)
Ok for robin to ask daughter how she has been giving the lantus; on chart she is taking 45 units at night

## 2012-08-13 ENCOUNTER — Other Ambulatory Visit: Payer: Self-pay

## 2012-08-13 MED ORDER — METFORMIN HCL ER 500 MG PO TB24: 1500.0000 mg | ORAL_TABLET | Freq: Every morning | ORAL | Status: DC

## 2012-08-13 NOTE — Telephone Encounter (Signed)
Informed Hospice of instructions on lantus.  Also MD received results from pulse oximetry test and informed hospice per MD instructions ok to order 2 L overnight oxygen.

## 2012-08-13 NOTE — Telephone Encounter (Signed)
Spoke to the daughter [name].  She has been giving the patient 25 units at night per MD verbal instructions at the last OV due to the patient not eating well.  She has not been on a sliding scale at all just reduced from the 45 units as med list states to 25 units due to the patient simple no longer finishing her meals and appetite has not increased.

## 2012-08-13 NOTE — Telephone Encounter (Signed)
Adjusted med list per MD instructions.  Called Terrese with Hospice to inform had to leave a message to call back

## 2012-08-13 NOTE — Telephone Encounter (Signed)
Ok for 25 units qhs  Robin to adjust med list, let hospice know

## 2012-08-19 ENCOUNTER — Telehealth: Payer: Self-pay

## 2012-08-19 NOTE — Telephone Encounter (Signed)
Hospice called to inform the patient will be going to [facility] for respite care for 9 days starting on August 20, 2012.  [place].  HHRN did want MD to know the patients lungs do sound clear now.

## 2012-08-19 NOTE — Telephone Encounter (Signed)
noted 

## 2012-08-20 ENCOUNTER — Telehealth: Payer: Self-pay

## 2012-08-20 NOTE — Telephone Encounter (Signed)
Ok for verbal 

## 2012-08-20 NOTE — Telephone Encounter (Signed)
Hospice needs an order to D/C the patients thickener.  Verbal is ok [phone]

## 2012-08-20 NOTE — Telephone Encounter (Signed)
Hospice informed  

## 2012-09-11 ENCOUNTER — Encounter: Payer: Self-pay | Admitting: Internal Medicine

## 2012-09-11 ENCOUNTER — Ambulatory Visit (INDEPENDENT_AMBULATORY_CARE_PROVIDER_SITE_OTHER): Admitting: Internal Medicine

## 2012-09-11 VITALS — BP 112/68 | HR 66 | Temp 97.9°F | Wt 165.0 lb

## 2012-09-11 DIAGNOSIS — I10 Essential (primary) hypertension: Secondary | ICD-10-CM

## 2012-09-11 DIAGNOSIS — F068 Other specified mental disorders due to known physiological condition: Secondary | ICD-10-CM

## 2012-09-11 DIAGNOSIS — E119 Type 2 diabetes mellitus without complications: Secondary | ICD-10-CM

## 2012-09-11 DIAGNOSIS — R531 Weakness: Secondary | ICD-10-CM

## 2012-09-11 DIAGNOSIS — R5383 Other fatigue: Secondary | ICD-10-CM

## 2012-09-11 NOTE — Assessment & Plan Note (Signed)
For UA today with hx of strong urine odor, but o/w not PT candidate

## 2012-09-11 NOTE — Assessment & Plan Note (Signed)
stable overall by history and exam, recent data reviewed with pt, and pt to continue medical treatment as before,  to f/u any worsening symptoms or concerns BP Readings from Last 3 Encounters:  09/11/12 112/68  06/05/12 110/72  05/26/12 154/74

## 2012-09-11 NOTE — Assessment & Plan Note (Signed)
Ok to stop the metformin, cont lantus at 25 units, goal cbg < 200

## 2012-09-11 NOTE — Patient Instructions (Signed)
OK to stop the alendronate, simvastatin, namenda, aricept, and metformin Please continue all other medications as before, and refills have been done if requested. The specimen will be sent for testing The goal for the blood sugar should be less than 200  Please remember to sign up for My Chart if you have not done so, as this will be important to you in the future with finding out test results, communicating by private email, and scheduling acute appointments online when needed.  Please return in 3 months, or sooner if needed

## 2012-09-11 NOTE — Progress Notes (Signed)
Subjective:    Patient ID: Alicia Kent, female    DOB: 08-13-1930, 79 y.o.   MRN: [id number]  HPI  Here to f/u, recent cxr neg for pna, but bronchitic/pna symptoms resolved. Delusions "full force" every day, hospice MD requests ? Need for alnedronat, simvastatin, nameda, aricept.  Wondering if needs metformin with the lantus as well. Only eating 30% meals, maybe less  Pt denies chest pain, increased sob or doe, wheezing, orthopnea, PND, increased LE swelling, palpitations, dizziness or syncope.  Pt denies polydipsia, polyuria . Had some low sugars and lantus decreased form 40.  Pt denies new neurological symptoms such as new headache, or facial or extremity weakness or numbness.   Pt states overall good compliance with meds, has been trying to follow lower cholesterol, diabetic diet, with wt overall down several lbs in the past 2 wks.  Has strong odor urine in the am, sometimes too tired for bathiing, even sleeping, sleeps during the day, also most nights.  Has diarrhea often with incontincne during the wk several times.  Weaker, doet not want to move legs, seems weaker, spends most of her days just sitting, Past Medical History  Diagnosis Date  . Diabetes mellitus type II   . Hyperlipidemia   . HTN (hypertension)   . GERD (gastroesophageal reflux disease)   . Allergic rhinitis   . Depression   . Dementia     mild  . History of CVA (cerebrovascular accident)   . LBBB (left bundle branch block)   . AAA (abdominal aortic aneurysm)     small, last check in 05/2009, due for repeat in 05/2010  . Carotid stenosis 09/2009    40-59% RICA, 0-39% LICA  . LVH (left ventricular hypertrophy) 10/2009    Moderate, EF 55-60%, no regional wall motion abnormalities  . Shellfish allergy     anaphylaxis  . Osteopenia   . Vitamin D deficiency   . Fracture of rib of left side 12/2009  . Asthma 01/27/2011  . Diastolic CHF 08/02/2011   Past Surgical History  Procedure Laterality Date  . Ankle surgery     Right, by Ortho in [place]  . Tracheostomy  2010    anaphylactic episode on vent  . Tubal ligation      reports that she has been smoking.  She does not have any smokeless tobacco history on file. She reports that she does not drink alcohol or use illicit drugs. family history includes Diabetes type II in her other; Hypertension in her brother; Rheum arthritis in her sister; and Stroke in her brother. Allergies  Allergen Reactions  . Ibuprofen Anaphylaxis  . Shellfish-Derived Products Anaphylaxis    Swelling tongue and throat  . Ace Inhibitors Other (See Comments)    unknown  . Sulfa Antibiotics Nausea And Vomiting   Current Outpatient Prescriptions on File Prior to Visit  Medication Sig Dispense Refill  . alendronate (FOSAMAX) 70 MG tablet Take 70 mg by mouth every Thursday.      Marland Kitchen aspirin 81 MG EC tablet Take 81 mg by mouth every morning.       Marland Kitchen azithromycin (ZITHROMAX Z-PAK) 250 MG tablet Use as directed  6 each  1  . citalopram (CELEXA) 20 MG tablet Take 20 mg by mouth every morning.      . [facility] SENNA PLUS 8.6-50 MG per tablet TAKE 1 TO 5 TABLETS ONCE TO TWICE A DAY AS NEEDED FOR CONSTIPATION  60 tablet  11  . donepezil (ARICEPT) 10 MG tablet Take  10 mg by mouth every morning.      . famotidine (PEPCID) 40 MG tablet Take 40 mg by mouth every morning.      . fluticasone (FLONASE) 50 MCG/ACT nasal spray Place 2 sprays into the nose daily as needed. For allergies.      Marland Kitchen HYDROcodone-acetaminophen (NORCO/VICODIN) 5-325 MG per tablet Take 1 tablet by mouth 3 (three) times daily as needed for pain.  90 tablet  2  . insulin glargine (LANTUS) 100 UNIT/ML injection Inject 25 Units into the skin at bedtime.       Marland Kitchen ipratropium-albuterol (DUONEB) 0.5-2.5 (3) MG/3ML SOLN Take 3 mLs by nebulization every 6 (six) hours as needed.  360 mL  11  . labetalol (NORMODYNE) 200 MG tablet Take 200 mg by mouth 2 (two) times daily.      . memantine (NAMENDA) 5 MG tablet Take 5 mg by mouth 2 (two) times daily.       . metFORMIN (GLUCOPHAGE-XR) 500 MG 24 hr tablet Take 3 tablets (1,500 mg total) by mouth every morning.  270 tablet  3  . montelukast (SINGULAIR) 10 MG tablet Take 10 mg by mouth at bedtime.      Marland Kitchen NIFEdipine (PROCARDIA-XL/ADALAT-CC/NIFEDICAL-XL) 30 MG 24 hr tablet Take 30 mg by mouth every morning.      . risperiDONE (RISPERDAL) 1 MG tablet Take 1 mg by mouth at bedtime.      . simvastatin (ZOCOR) 40 MG tablet Take 40 mg by mouth at bedtime.       No current facility-administered medications on file prior to visit.   Review of Systems Unable due to dementia    Objective:   Physical Exam BP 112/68  Pulse 66  Temp(Src) 97.9 F (36.6 C) (Oral)  Wt 165 lb (74.844 kg)  BMI 25.84 kg/m2  SpO2 95% VS noted, not acutely ill Constitutional: Pt appears well-developed and well-nourished.  HENT: Head: NCAT.  Right Ear: External ear normal.  Left Ear: External ear normal.  Eyes: Conjunctivae and EOM are normal. Pupils are equal, round, and reactive to light.  Neck: Normal range of motion. Neck supple.  Cardiovascular: Normal rate and regular rhythm.   Pulmonary/Chest: Effort normal and breath sounds decrease bilat.  Abd:  Soft, NT, non-distended, + BS Neurological: Pt is alert. Skin: Skin is warm. No erythema.  Psychiatric: Pt behavior is normal. Thought content c/w dementia severe    Assessment & Plan:

## 2012-09-11 NOTE — Assessment & Plan Note (Signed)
Progressive, severe, now with hospice, ok to stop meds such as fosamax, zocor, namenda, aricept

## 2012-09-16 ENCOUNTER — Telehealth: Payer: Self-pay | Admitting: *Deleted

## 2012-09-16 NOTE — Telephone Encounter (Signed)
pts daughter called requesting clarification on what medications pt was to discontinue since last visit.  Advised her of discontinued medications as per OV notes.

## 2012-09-17 ENCOUNTER — Other Ambulatory Visit (INDEPENDENT_AMBULATORY_CARE_PROVIDER_SITE_OTHER): Payer: Medicare Other

## 2012-09-17 DIAGNOSIS — R5381 Other malaise: Secondary | ICD-10-CM

## 2012-09-17 DIAGNOSIS — R531 Weakness: Secondary | ICD-10-CM

## 2012-09-17 LAB — URINALYSIS, ROUTINE W REFLEX MICROSCOPIC
Bilirubin Urine: NEGATIVE
Leukocytes, UA: NEGATIVE
Nitrite: POSITIVE
Specific Gravity, Urine: 1.01 (ref 1.000–1.030)
Total Protein, Urine: NEGATIVE
pH: 7.5 (ref 5.0–8.0)

## 2012-09-19 LAB — URINE CULTURE

## 2012-09-29 ENCOUNTER — Telehealth: Payer: Self-pay

## 2012-09-29 MED ORDER — FEXOFENADINE HCL 180 MG PO TABS: 180.0000 mg | ORAL_TABLET | Freq: Every day | ORAL | Status: DC

## 2012-09-29 NOTE — Telephone Encounter (Signed)
Ok for [employer] prn - done emr  OK for oxygen as suggested

## 2012-09-29 NOTE — Telephone Encounter (Signed)
Hospice called to inform the patient has some nasal congestion and cough at night.  Please offer something for this.  Also she stated the patients oxygen is used at night only and could that be change to as needed.  Please advise call back number is [phone]

## 2012-09-29 NOTE — Telephone Encounter (Signed)
Hospice informed  

## 2012-10-11 ENCOUNTER — Other Ambulatory Visit: Payer: Self-pay | Admitting: Internal Medicine

## 2012-11-20 ENCOUNTER — Telehealth: Payer: Self-pay | Admitting: Internal Medicine

## 2012-11-20 NOTE — Telephone Encounter (Signed)
Spoke to hospice physician, how notes Alicia Kent functional capacity has actually improved, and no active issues worsening, and not actively dying, and no longer likely qualifies for ongoing Hospice services  I noted to him this was ok, and I will cont to be PCP, also unfort I know that [facility] is no longer contracted with [facility] after oct 1, so we will not find assist there;  I will cont to monitor pt needs

## 2012-11-25 ENCOUNTER — Telehealth: Payer: Self-pay | Admitting: *Deleted

## 2012-11-25 DIAGNOSIS — J449 Chronic obstructive pulmonary disease, unspecified: Secondary | ICD-10-CM

## 2012-11-25 NOTE — Telephone Encounter (Signed)
Need to clarify - how much o2 is pt taking now?

## 2012-11-25 NOTE — Telephone Encounter (Signed)
Teress from Hospice called to advise pt will be discharged from [facility] on 9.26.14.  O2 needs to be reordered through [facility] in order for it to be continued in the home.  Please advise

## 2012-11-26 NOTE — Telephone Encounter (Signed)
Returned call to Hospice, per recording, office is currently closed

## 2012-11-26 NOTE — Telephone Encounter (Signed)
Needs to be put in as an order

## 2012-11-26 NOTE — Telephone Encounter (Signed)
Spoke with [name], pts daughter, states pt is on 2 liters.

## 2012-11-26 NOTE — Telephone Encounter (Signed)
Already done I think - as DME home oxygen use only

## 2012-11-27 NOTE — Telephone Encounter (Signed)
Complete by MD 

## 2012-12-03 ENCOUNTER — Telehealth: Payer: Self-pay | Admitting: *Deleted

## 2012-12-03 DIAGNOSIS — J449 Chronic obstructive pulmonary disease, unspecified: Secondary | ICD-10-CM

## 2012-12-03 DIAGNOSIS — R0902 Hypoxemia: Secondary | ICD-10-CM

## 2012-12-03 NOTE — Telephone Encounter (Signed)
Called [facility] and they cannot do this.  The patient would need to come into the office to do, but they are not certain this is possible for this patient, please advise

## 2012-12-03 NOTE — Telephone Encounter (Signed)
Melissa from [facility] called states she needs qualifying saturation levels in order to fill the home O2 order.  Please advise

## 2012-12-03 NOTE — Telephone Encounter (Signed)
I dont have any office room air o2 sats and not sure how to find this out  [facility] for [facility] to check o2 sat on room air , at rest and with ambulation if possible

## 2012-12-04 NOTE — Telephone Encounter (Signed)
Not sure how to proceed?  Can [facility] be contacted?

## 2012-12-04 NOTE — Telephone Encounter (Signed)
[facility] informed

## 2012-12-04 NOTE — Telephone Encounter (Signed)
Done referral

## 2012-12-04 NOTE — Telephone Encounter (Signed)
[facility] informed that referral can be made to [facility] and they may do.

## 2012-12-09 NOTE — Telephone Encounter (Signed)
[facility] called and they cannot qualify the patient for daytime oxygen  Please advise

## 2012-12-09 NOTE — Telephone Encounter (Signed)
Called [name] the patients daughter and they had already scheduled an appointment for Friday December 11, 2012.

## 2012-12-09 NOTE — Telephone Encounter (Signed)
Will need OV then

## 2012-12-11 ENCOUNTER — Ambulatory Visit (INDEPENDENT_AMBULATORY_CARE_PROVIDER_SITE_OTHER): Payer: Medicare Other | Admitting: Internal Medicine

## 2012-12-11 ENCOUNTER — Encounter: Payer: Self-pay | Admitting: Internal Medicine

## 2012-12-11 VITALS — BP 150/82 | HR 74 | Temp 97.5°F | Ht 65.0 in | Wt 164.0 lb

## 2012-12-11 DIAGNOSIS — I509 Heart failure, unspecified: Secondary | ICD-10-CM

## 2012-12-11 DIAGNOSIS — I5030 Unspecified diastolic (congestive) heart failure: Secondary | ICD-10-CM

## 2012-12-11 DIAGNOSIS — G4734 Idiopathic sleep related nonobstructive alveolar hypoventilation: Secondary | ICD-10-CM | POA: Insufficient documentation

## 2012-12-11 DIAGNOSIS — R0902 Hypoxemia: Secondary | ICD-10-CM

## 2012-12-11 DIAGNOSIS — J449 Chronic obstructive pulmonary disease, unspecified: Secondary | ICD-10-CM

## 2012-12-11 DIAGNOSIS — G4733 Obstructive sleep apnea (adult) (pediatric): Secondary | ICD-10-CM | POA: Insufficient documentation

## 2012-12-11 DIAGNOSIS — Z23 Encounter for immunization: Secondary | ICD-10-CM

## 2012-12-11 DIAGNOSIS — I10 Essential (primary) hypertension: Secondary | ICD-10-CM

## 2012-12-11 NOTE — Progress Notes (Signed)
Subjective:    Patient ID: Alicia Kent, female    DOB: [birth year], 79 y.o.   MRN: [id number]  HPI Here to f/u; overall doing ok,  Pt denies chest pain, increased sob or doe, wheezing, orthopnea, PND, increased LE swelling, palpitations, dizziness or syncope.  Pt denies polydipsia, polyuria, or low sugar symptoms such as weakness or confusion improved with po intake.  Pt denies new neurological symptoms such as new headache, or facial or extremity weakness or numbness.   Pt states overall good compliance with meds, needs renewal Nighttime home o2 2L, no longer under hospice.   Past Medical History  Diagnosis Date  . Diabetes mellitus type II   . Hyperlipidemia   . HTN (hypertension)   . GERD (gastroesophageal reflux disease)   . Allergic rhinitis   . Depression   . Dementia     mild  . History of CVA (cerebrovascular accident)   . LBBB (left bundle branch block)   . AAA (abdominal aortic aneurysm)     small, last check in 05/2009, due for repeat in 05/2010  . Carotid stenosis 09/2009    40-59% RICA, 0-39% LICA  . LVH (left ventricular hypertrophy) 10/2009    Moderate, EF 55-60%, no regional wall motion abnormalities  . Shellfish allergy     anaphylaxis  . Osteopenia   . Vitamin D deficiency   . Fracture of rib of left side 12/2009  . Asthma 01/27/2011  . Diastolic CHF 08/02/2011   Past Surgical History  Procedure Laterality Date  . Ankle surgery      Right, by Ortho in [place]  . Tracheostomy  2010    anaphylactic episode on vent  . Tubal ligation      reports that she has been smoking.  She does not have any smokeless tobacco history on file. She reports that she does not drink alcohol or use illicit drugs. family history includes Diabetes type II in her other; Hypertension in her brother; Rheum arthritis in her sister; Stroke in her brother. Allergies  Allergen Reactions  . Ibuprofen Anaphylaxis  . Shellfish-Derived Products Anaphylaxis    Swelling tongue and throat  . Ace  Inhibitors Other (See Comments)    unknown  . Sulfa Antibiotics Nausea And Vomiting   Current Outpatient Prescriptions on File Prior to Visit  Medication Sig Dispense Refill  . aspirin 81 MG EC tablet Take 81 mg by mouth every morning.       . citalopram (CELEXA) 20 MG tablet Take 20 mg by mouth every morning.      . [facility] SENNA PLUS 8.6-50 MG per tablet TAKE 1 TO 5 TABLETS ONCE TO TWICE A DAY AS NEEDED FOR CONSTIPATION  60 tablet  11  . famotidine (PEPCID) 40 MG tablet Take 40 mg by mouth every morning.      . fexofenadine (ALLEGRA) 180 MG tablet Take 1 tablet (180 mg total) by mouth daily.  30 tablet  2  . fluticasone (FLONASE) 50 MCG/ACT nasal spray Place 2 sprays into the nose daily as needed. For allergies.      Marland Kitchen HYDROcodone-acetaminophen (NORCO/VICODIN) 5-325 MG per tablet Take 1 tablet by mouth 3 (three) times daily as needed for pain.  90 tablet  2  . insulin glargine (LANTUS) 100 UNIT/ML injection Inject 25 Units into the skin at bedtime.       Marland Kitchen ipratropium-albuterol (DUONEB) 0.5-2.5 (3) MG/3ML SOLN Take 3 mLs by nebulization every 6 (six) hours as needed.  360 mL  11  .  labetalol (NORMODYNE) 200 MG tablet Take 200 mg by mouth 2 (two) times daily.      . montelukast (SINGULAIR) 10 MG tablet Take 10 mg by mouth at bedtime.      Marland Kitchen NIFEdipine (PROCARDIA-XL/ADALAT-CC/NIFEDICAL-XL) 30 MG 24 hr tablet Take 30 mg by mouth every morning.      Marland Kitchen NIFEdipine (PROCARDIA-XL/ADALAT-CC/NIFEDICAL-XL) 30 MG 24 hr tablet TAKE ONE TABLET BY MOUTH DAILY  90 tablet  1  . risperiDONE (RISPERDAL) 1 MG tablet Take 1 mg by mouth at bedtime.       No current facility-administered medications on file prior to visit.   Review of Systems  not able due to dementia    Objective:   Physical Exam BP 150/82  Pulse 74  Temp(Src) 97.5 F (36.4 C) (Oral)  Ht 5\' 5"  (1.651 m)  Wt 164 lb (74.39 kg)  BMI 27.29 kg/m2  SpO2 97% VS noted, not ill appearing Constitutional: Pt appears well-developed and  well-nourished.  HENT: Head: NCAT.  Right Ear: External ear normal.  Left Ear: External ear normal.  Eyes: Conjunctivae and EOM are normal. Pupils are equal, round, and reactive to light.  Neck: Normal range of motion. Neck supple.  Cardiovascular: Normal rate and regular rhythm.   Pulmonary/Chest: Effort normal and breath sounds decreased, no rales or wheezing.  Abd:  Soft, NT, non-distended, + BS Neurological: Pt is alert. At baseline confused  Skin: Skin is warm. No erythema. No LE edema Psychiatric: Pt behavior is norma.     Assessment & Plan:

## 2012-12-11 NOTE — Patient Instructions (Addendum)
You had the flu shot today Please continue all other medications as before, Please have the pharmacy call with any other refills you may need. You will be contacted regarding the referral for: Home o2 2L for night only  Please remember to sign up for My Chart if you have not done so, as this will be important to you in the future with finding out test results, communicating by private email, and scheduling acute appointments online when needed.  Please return in 3 months, or sooner if needed

## 2012-12-12 NOTE — Assessment & Plan Note (Signed)
stable overall by history and exam, and pt to continue medical treatment as before,  to f/u any worsening symptoms or concerns 

## 2012-12-12 NOTE — Assessment & Plan Note (Signed)
stable overall by history and exam, recent data reviewed with pt, and pt to continue medical treatment as before,  to f/u any worsening symptoms or concerns SpO2 Readings from Last 3 Encounters:  12/11/12 97%  09/11/12 95%  06/05/12 94%

## 2012-12-12 NOTE — Assessment & Plan Note (Signed)
For restart nocturnal Home o2 2L,  to f/u any worsening symptoms or concerns

## 2012-12-12 NOTE — Assessment & Plan Note (Signed)
Mild elev, overall stable overall by history and exam, recent data reviewed with pt, and pt to continue medical treatment as before,  to f/u any worsening symptoms or concerns BP Readings from Last 3 Encounters:  12/11/12 150/82  09/11/12 112/68  06/05/12 110/72

## 2012-12-27 ENCOUNTER — Encounter (HOSPITAL_COMMUNITY): Payer: Self-pay | Admitting: Emergency Medicine

## 2012-12-27 ENCOUNTER — Inpatient Hospital Stay (HOSPITAL_COMMUNITY)
Admission: EM | Admit: 2012-12-27 | Discharge: 2012-12-30 | DRG: 535 | Disposition: A | Discharge status: Skilled Nursing Facility | Payer: Medicare Other | Attending: Internal Medicine | Admitting: Internal Medicine

## 2012-12-27 ENCOUNTER — Inpatient Hospital Stay (HOSPITAL_COMMUNITY): Payer: Medicare Other

## 2012-12-27 ENCOUNTER — Emergency Department (HOSPITAL_COMMUNITY): Payer: Medicare Other

## 2012-12-27 DIAGNOSIS — D649 Anemia, unspecified: Secondary | ICD-10-CM

## 2012-12-27 DIAGNOSIS — J9601 Acute respiratory failure with hypoxia: Secondary | ICD-10-CM

## 2012-12-27 DIAGNOSIS — F17200 Nicotine dependence, unspecified, uncomplicated: Secondary | ICD-10-CM | POA: Diagnosis present

## 2012-12-27 DIAGNOSIS — D72829 Elevated white blood cell count, unspecified: Secondary | ICD-10-CM

## 2012-12-27 DIAGNOSIS — I10 Essential (primary) hypertension: Secondary | ICD-10-CM

## 2012-12-27 DIAGNOSIS — J449 Chronic obstructive pulmonary disease, unspecified: Secondary | ICD-10-CM | POA: Diagnosis present

## 2012-12-27 DIAGNOSIS — Z79899 Other long term (current) drug therapy: Secondary | ICD-10-CM

## 2012-12-27 DIAGNOSIS — E119 Type 2 diabetes mellitus without complications: Secondary | ICD-10-CM

## 2012-12-27 DIAGNOSIS — E876 Hypokalemia: Secondary | ICD-10-CM | POA: Diagnosis present

## 2012-12-27 DIAGNOSIS — J9620 Acute and chronic respiratory failure, unspecified whether with hypoxia or hypercapnia: Secondary | ICD-10-CM | POA: Diagnosis present

## 2012-12-27 DIAGNOSIS — I714 Abdominal aortic aneurysm, without rupture, unspecified: Secondary | ICD-10-CM | POA: Diagnosis present

## 2012-12-27 DIAGNOSIS — I5030 Unspecified diastolic (congestive) heart failure: Secondary | ICD-10-CM | POA: Diagnosis present

## 2012-12-27 DIAGNOSIS — Z7982 Long term (current) use of aspirin: Secondary | ICD-10-CM

## 2012-12-27 DIAGNOSIS — J441 Chronic obstructive pulmonary disease with (acute) exacerbation: Secondary | ICD-10-CM

## 2012-12-27 DIAGNOSIS — Z8673 Personal history of transient ischemic attack (TIA), and cerebral infarction without residual deficits: Secondary | ICD-10-CM

## 2012-12-27 DIAGNOSIS — F068 Other specified mental disorders due to known physiological condition: Secondary | ICD-10-CM

## 2012-12-27 DIAGNOSIS — F3289 Other specified depressive episodes: Secondary | ICD-10-CM

## 2012-12-27 DIAGNOSIS — W010XXA Fall on same level from slipping, tripping and stumbling without subsequent striking against object, initial encounter: Secondary | ICD-10-CM | POA: Diagnosis present

## 2012-12-27 DIAGNOSIS — K219 Gastro-esophageal reflux disease without esophagitis: Secondary | ICD-10-CM | POA: Diagnosis present

## 2012-12-27 DIAGNOSIS — E785 Hyperlipidemia, unspecified: Secondary | ICD-10-CM

## 2012-12-27 DIAGNOSIS — E559 Vitamin D deficiency, unspecified: Secondary | ICD-10-CM | POA: Diagnosis present

## 2012-12-27 DIAGNOSIS — F0390 Unspecified dementia without behavioral disturbance: Secondary | ICD-10-CM | POA: Diagnosis present

## 2012-12-27 DIAGNOSIS — R531 Weakness: Secondary | ICD-10-CM

## 2012-12-27 DIAGNOSIS — Z91013 Allergy to seafood: Secondary | ICD-10-CM

## 2012-12-27 DIAGNOSIS — Z882 Allergy status to sulfonamides status: Secondary | ICD-10-CM

## 2012-12-27 DIAGNOSIS — Z794 Long term (current) use of insulin: Secondary | ICD-10-CM

## 2012-12-27 DIAGNOSIS — F0391 Unspecified dementia with behavioral disturbance: Secondary | ICD-10-CM | POA: Diagnosis present

## 2012-12-27 DIAGNOSIS — F329 Major depressive disorder, single episode, unspecified: Secondary | ICD-10-CM | POA: Diagnosis present

## 2012-12-27 DIAGNOSIS — M899 Disorder of bone, unspecified: Secondary | ICD-10-CM | POA: Diagnosis present

## 2012-12-27 DIAGNOSIS — S32509A Unspecified fracture of unspecified pubis, initial encounter for closed fracture: Principal | ICD-10-CM | POA: Diagnosis present

## 2012-12-27 DIAGNOSIS — Z66 Do not resuscitate: Secondary | ICD-10-CM | POA: Diagnosis present

## 2012-12-27 DIAGNOSIS — I509 Heart failure, unspecified: Secondary | ICD-10-CM | POA: Diagnosis present

## 2012-12-27 DIAGNOSIS — I6529 Occlusion and stenosis of unspecified carotid artery: Secondary | ICD-10-CM | POA: Diagnosis present

## 2012-12-27 DIAGNOSIS — Z9981 Dependence on supplemental oxygen: Secondary | ICD-10-CM

## 2012-12-27 DIAGNOSIS — J189 Pneumonia, unspecified organism: Secondary | ICD-10-CM

## 2012-12-27 DIAGNOSIS — S329XXA Fracture of unspecified parts of lumbosacral spine and pelvis, initial encounter for closed fracture: Secondary | ICD-10-CM

## 2012-12-27 LAB — URINALYSIS, ROUTINE W REFLEX MICROSCOPIC
Hgb urine dipstick: NEGATIVE
Nitrite: NEGATIVE
Specific Gravity, Urine: 1.017 (ref 1.005–1.030)
Urobilinogen, UA: 0.2 mg/dL (ref 0.0–1.0)
pH: 6 (ref 5.0–8.0)

## 2012-12-27 LAB — TSH: TSH: 2.713 u[IU]/mL (ref 0.350–4.500)

## 2012-12-27 LAB — BASIC METABOLIC PANEL
BUN: 8 mg/dL (ref 6–23)
CO2: 21 mEq/L (ref 19–32)
Chloride: 102 mEq/L (ref 96–112)
GFR calc Af Amer: >90 mL/min (ref >90)
Potassium: 3.4 mEq/L — ABNORMAL LOW (ref 3.5–5.1)

## 2012-12-27 LAB — GLUCOSE, CAPILLARY
Glucose-Capillary: 132 mg/dL — ABNORMAL HIGH (ref 70–99)
Glucose-Capillary: 193 mg/dL — ABNORMAL HIGH (ref 70–99)

## 2012-12-27 LAB — CBC WITH DIFFERENTIAL/PLATELET
Basophils Absolute: 0 10*3/uL (ref 0.0–0.1)
HCT: 36.4 % (ref 36.0–46.0)
Hemoglobin: 12.3 g/dL (ref 12.0–15.0)
Lymphocytes Relative: 10 % — ABNORMAL LOW (ref 12–46)
Monocytes Absolute: 0.8 10*3/uL (ref 0.1–1.0)
Monocytes Relative: 6 % (ref 3–12)
Neutro Abs: 12.3 10*3/uL — ABNORMAL HIGH (ref 1.7–7.7)
RDW: 13.8 % (ref 11.5–15.5)
WBC: 14.8 10*3/uL — ABNORMAL HIGH (ref 4.0–10.5)

## 2012-12-27 LAB — HEMOGLOBIN A1C
Hgb A1c MFr Bld: 6 % — ABNORMAL HIGH (ref <5.7)
Mean Plasma Glucose: 126 mg/dL — ABNORMAL HIGH (ref <117)

## 2012-12-27 LAB — STREP PNEUMONIAE URINARY ANTIGEN: Strep Pneumo Urinary Antigen: NEGATIVE

## 2012-12-27 MED ORDER — ACETAMINOPHEN 650 MG RE SUPP: 650.0000 mg | Freq: Four times a day (QID) | RECTAL | Status: DC | PRN

## 2012-12-27 MED ORDER — MORPHINE SULFATE 2 MG/ML IJ SOLN: 1.0000 mg | INTRAMUSCULAR | Status: DC | PRN

## 2012-12-27 MED ORDER — LORATADINE 10 MG PO TABS
10.0000 mg | ORAL_TABLET | Freq: Every day | ORAL | Status: DC
  Administered 2012-12-27 – 2012-12-30 (×4): 10 mg via ORAL
  Filled 2012-12-27 (×4): qty 1

## 2012-12-27 MED ORDER — SODIUM CHLORIDE 0.9 % IV SOLN
INTRAVENOUS | Status: AC
  Administered 2012-12-27: 16:00:00 via INTRAVENOUS

## 2012-12-27 MED ORDER — CITALOPRAM HYDROBROMIDE 20 MG PO TABS
20.0000 mg | ORAL_TABLET | Freq: Every morning | ORAL | Status: DC
  Administered 2012-12-27 – 2012-12-30 (×4): 20 mg via ORAL
  Filled 2012-12-27 (×4): qty 1

## 2012-12-27 MED ORDER — LABETALOL HCL 200 MG PO TABS
200.0000 mg | ORAL_TABLET | Freq: Two times a day (BID) | ORAL | Status: DC
  Administered 2012-12-27 – 2012-12-30 (×6): 200 mg via ORAL
  Filled 2012-12-27 (×7): qty 1

## 2012-12-27 MED ORDER — PREDNISONE 20 MG PO TABS
40.0000 mg | ORAL_TABLET | Freq: Two times a day (BID) | ORAL | Status: DC
  Administered 2012-12-27 – 2012-12-30 (×7): 40 mg via ORAL
  Filled 2012-12-27 (×8): qty 2

## 2012-12-27 MED ORDER — ASPIRIN EC 81 MG PO TBEC
81.0000 mg | DELAYED_RELEASE_TABLET | Freq: Every day | ORAL | Status: DC
  Administered 2012-12-28 – 2012-12-30 (×3): 81 mg via ORAL
  Filled 2012-12-27 (×3): qty 1

## 2012-12-27 MED ORDER — DEXTROSE 5 % IV SOLN
1.0000 g | Freq: Once | INTRAVENOUS | Status: AC
  Administered 2012-12-27: 1 g via INTRAVENOUS
  Filled 2012-12-27: qty 10

## 2012-12-27 MED ORDER — FAMOTIDINE 40 MG PO TABS
40.0000 mg | ORAL_TABLET | Freq: Every morning | ORAL | Status: DC
  Administered 2012-12-27 – 2012-12-30 (×4): 40 mg via ORAL
  Filled 2012-12-27 (×4): qty 1

## 2012-12-27 MED ORDER — DOXYCYCLINE HYCLATE 100 MG IV SOLR
100.0000 mg | Freq: Once | INTRAVENOUS | Status: AC
  Administered 2012-12-27: 11:00:00 100 mg via INTRAVENOUS
  Filled 2012-12-27: qty 100

## 2012-12-27 MED ORDER — DONEPEZIL HCL 10 MG PO TABS
10.0000 mg | ORAL_TABLET | Freq: Every day | ORAL | Status: DC
Start: 2012-12-27 — End: 2012-12-30
  Administered 2012-12-27 – 2012-12-29 (×3): 10 mg via ORAL
  Filled 2012-12-27 (×4): qty 1

## 2012-12-27 MED ORDER — ASPIRIN 81 MG PO TBEC: 81.0000 mg | DELAYED_RELEASE_TABLET | Freq: Every morning | ORAL | Status: DC

## 2012-12-27 MED ORDER — ONDANSETRON HCL 4 MG PO TABS: 4.0000 mg | ORAL_TABLET | Freq: Four times a day (QID) | ORAL | Status: DC | PRN

## 2012-12-27 MED ORDER — DEXTROSE 5 % IV SOLN
1.0000 g | INTRAVENOUS | Status: DC
  Administered 2012-12-28 – 2012-12-30 (×3): 1 g via INTRAVENOUS
  Filled 2012-12-27 (×5): qty 10

## 2012-12-27 MED ORDER — RISPERIDONE 1 MG PO TABS
1.0000 mg | ORAL_TABLET | Freq: Two times a day (BID) | ORAL | Status: DC
  Administered 2012-12-27 – 2012-12-30 (×6): 1 mg via ORAL
  Filled 2012-12-27 (×7): qty 1

## 2012-12-27 MED ORDER — MONTELUKAST SODIUM 10 MG PO TABS
10.0000 mg | ORAL_TABLET | Freq: Every day | ORAL | Status: DC
  Administered 2012-12-27 – 2012-12-29 (×3): 10 mg via ORAL
  Filled 2012-12-27 (×4): qty 1

## 2012-12-27 MED ORDER — INSULIN ASPART 100 UNIT/ML ~~LOC~~ SOLN
0.0000 [IU] | Freq: Three times a day (TID) | SUBCUTANEOUS | Status: DC
  Administered 2012-12-28: 12:00:00 5 [IU] via SUBCUTANEOUS
  Administered 2012-12-28 (×2): 3 [IU] via SUBCUTANEOUS
  Administered 2012-12-29: 5 [IU] via SUBCUTANEOUS
  Administered 2012-12-29: 17:00:00 3 [IU] via SUBCUTANEOUS
  Administered 2012-12-29: 5 [IU] via SUBCUTANEOUS
  Administered 2012-12-30 (×2): 3 [IU] via SUBCUTANEOUS

## 2012-12-27 MED ORDER — NIFEDIPINE ER 30 MG PO TB24
30.0000 mg | ORAL_TABLET | Freq: Every morning | ORAL | Status: DC
  Administered 2012-12-27 – 2012-12-30 (×4): 30 mg via ORAL
  Filled 2012-12-27 (×4): qty 1

## 2012-12-27 MED ORDER — PANTOPRAZOLE SODIUM 40 MG PO TBEC
40.0000 mg | DELAYED_RELEASE_TABLET | Freq: Every day | ORAL | Status: DC
  Administered 2012-12-27 – 2012-12-30 (×4): 40 mg via ORAL
  Filled 2012-12-27 (×3): qty 1

## 2012-12-27 MED ORDER — SODIUM CHLORIDE 0.9 % IV BOLUS (SEPSIS)
1000.0000 mL | Freq: Once | INTRAVENOUS | Status: AC
  Administered 2012-12-27: 1000 mL via INTRAVENOUS

## 2012-12-27 MED ORDER — BUDESONIDE 0.25 MG/2ML IN SUSP
0.2500 mg | Freq: Two times a day (BID) | RESPIRATORY_TRACT | Status: DC
  Administered 2012-12-27 – 2012-12-30 (×6): 0.25 mg via RESPIRATORY_TRACT
  Filled 2012-12-27 (×8): qty 2

## 2012-12-27 MED ORDER — POLYETHYLENE GLYCOL 3350 17 G PO PACK
17.0000 g | PACK | Freq: Every day | ORAL | Status: DC
  Administered 2012-12-27 – 2012-12-29 (×3): 17 g via ORAL
  Filled 2012-12-27 (×4): qty 1

## 2012-12-27 MED ORDER — ACETAMINOPHEN 325 MG PO TABS: 650.0000 mg | ORAL_TABLET | Freq: Four times a day (QID) | ORAL | Status: DC | PRN

## 2012-12-27 MED ORDER — HYDROCODONE-ACETAMINOPHEN 5-325 MG PO TABS
1.0000 | ORAL_TABLET | Freq: Three times a day (TID) | ORAL | Status: DC | PRN
  Administered 2012-12-27 – 2012-12-28 (×2): 1 via ORAL
  Filled 2012-12-27 (×2): qty 1

## 2012-12-27 MED ORDER — ONDANSETRON HCL 4 MG/2ML IJ SOLN: 4.0000 mg | Freq: Four times a day (QID) | INTRAMUSCULAR | Status: DC | PRN

## 2012-12-27 MED ORDER — HYDROMORPHONE HCL PF 1 MG/ML IJ SOLN
0.5000 mg | INTRAMUSCULAR | Status: DC | PRN
  Administered 2012-12-27 (×2): 0.5 mg via INTRAVENOUS
  Filled 2012-12-27 (×2): qty 1

## 2012-12-27 MED ORDER — DOCUSATE SODIUM 50 MG PO CAPS
50.0000 mg | ORAL_CAPSULE | Freq: Two times a day (BID) | ORAL | Status: DC
  Administered 2012-12-27 – 2012-12-30 (×6): 50 mg via ORAL
  Filled 2012-12-27 (×7): qty 1

## 2012-12-27 MED ORDER — DOXYCYCLINE HYCLATE 100 MG IV SOLR
100.0000 mg | Freq: Two times a day (BID) | INTRAVENOUS | Status: DC
  Administered 2012-12-27 – 2012-12-30 (×6): 100 mg via INTRAVENOUS
  Filled 2012-12-27 (×9): qty 100

## 2012-12-27 MED ORDER — INSULIN GLARGINE 100 UNIT/ML ~~LOC~~ SOLN
25.0000 [IU] | Freq: Every day | SUBCUTANEOUS | Status: DC
  Administered 2012-12-27 – 2012-12-29 (×3): 25 [IU] via SUBCUTANEOUS
  Filled 2012-12-27 (×4): qty 0.25

## 2012-12-27 MED ORDER — HEPARIN SODIUM (PORCINE) 5000 UNIT/ML IJ SOLN
5000.0000 [IU] | Freq: Three times a day (TID) | INTRAMUSCULAR | Status: DC
  Administered 2012-12-27 – 2012-12-30 (×9): 5000 [IU] via SUBCUTANEOUS
  Filled 2012-12-27 (×11): qty 1

## 2012-12-27 NOTE — ED Notes (Signed)
Unable to complete orthostatic vitals due Pt  Having hip pain when sitting and unable to stand.

## 2012-12-27 NOTE — H&P (Addendum)
[facility] History and Physical  Alicia Kent AOZ:[id number] DOB: [birth year] DOA: 12/27/2012  Referring physician: Dr. Rhunette Croft PCP: Oliver Barre, MD   Chief Complaint: Fall, right hip pain, tachypnea  HPI: Alicia Kent is a 79 y.o. female with pmh significant for HTN, HLD, DM on insulin, Chronic resp failure due to COPD, dementia, depression, hx of CVA and GERD; came to ED after experiencing mechanical fall and complaining of severe right hip pain. Patient reports tripping earlythis morning in her way to the bathroom and subsequently having a lot of pain in her right hip and trouble with weight bearing and movement. Patient also endorses some dry cough and slight increase in SOB. Denies CP, fever, chills, abd pain, dysuria, hematemesis, melena or any other acute complaints.  In ED hip x-ray demonstrated hip fracture, CXR suggested early PNA; patient tachypneic with RR in the 40's and elevated WBC's. Orthopedic service consulted and [facility] called to admit for further eval and treatment.  Review of Systems:  Negative except as mentioned on HPI  Past Medical History  Diagnosis Date  . Diabetes mellitus type II   . Hyperlipidemia   . HTN (hypertension)   . GERD (gastroesophageal reflux disease)   . Allergic rhinitis   . Depression   . Dementia     mild  . History of CVA (cerebrovascular accident)   . LBBB (left bundle branch block)   . AAA (abdominal aortic aneurysm)     small, last check in 05/2009, due for repeat in 05/2010  . Carotid stenosis 09/2009    40-59% RICA, 0-39% LICA  . LVH (left ventricular hypertrophy) 10/2009    Moderate, EF 55-60%, no regional wall motion abnormalities  . Shellfish allergy     anaphylaxis  . Osteopenia   . Vitamin D deficiency   . Fracture of rib of left side 12/2009  . Asthma 01/27/2011  . Diastolic CHF 08/02/2011   Past Surgical History  Procedure Laterality Date  . Ankle surgery      Right, by Ortho in [place]  . Tracheostomy  2010     anaphylactic episode on vent  . Tubal ligation     Social History:  reports that she has been smoking.  She does not have any smokeless tobacco history on file. She reports that she does not drink alcohol or use illicit drugs.   Allergies  Allergen Reactions  . Ibuprofen Anaphylaxis  . Shellfish-Derived Products Anaphylaxis    Swelling tongue and throat  . Ace Inhibitors Other (See Comments)    unknown  . Sulfa Antibiotics Nausea And Vomiting    Family History  Problem Relation Age of Onset  . Rheum arthritis Sister   . Stroke Brother   . Hypertension Brother   . Diabetes type II Other     2 siblings    Prior to Admission medications   Medication Sig Start Date End Date Taking? Authorizing Provider  aspirin 81 MG EC tablet Take 81 mg by mouth every morning.    Yes Historical Provider, MD  citalopram (CELEXA) 20 MG tablet Take 20 mg by mouth every morning. 03/11/12  Yes Corwin Levins, MD  donepezil (ARICEPT) 10 MG tablet Take 10 mg by mouth daily.  12/13/12  Yes Historical Provider, MD  famotidine (PEPCID) 40 MG tablet Take 40 mg by mouth every morning. 12/23/11  Yes Corwin Levins, MD  fexofenadine (ALLEGRA) 180 MG tablet Take 1 tablet (180 mg total) by mouth daily. 09/29/12  Yes Corwin Levins, MD  insulin glargine (LANTUS) 100 UNIT/ML injection Inject 40 Units into the skin at bedtime.  03/11/12  Yes Corwin Levins, MD  ipratropium-albuterol (DUONEB) 0.5-2.5 (3) MG/3ML SOLN Take 3 mLs by nebulization every 6 (six) hours as needed. 06/08/12  Yes Corwin Levins, MD  labetalol (NORMODYNE) 200 MG tablet Take 200 mg by mouth 2 (two) times daily. 04/08/12  Yes Corwin Levins, MD  montelukast (SINGULAIR) 10 MG tablet Take 10 mg by mouth at bedtime. 03/11/12  Yes Corwin Levins, MD  NIFEdipine (PROCARDIA-XL/ADALAT-CC/NIFEDICAL-XL) 30 MG 24 hr tablet Take 30 mg by mouth every morning.   Yes Historical Provider, MD  risperiDONE (RISPERDAL) 1 MG tablet Take 1 mg by mouth 2 (two) times daily.  04/06/12  Yes Corwin Levins, MD  [employer] SENNA PLUS 8.6-50 MG per tablet TAKE 1 TO 5 TABLETS ONCE TO TWICE A DAY AS NEEDED FOR CONSTIPATION 06/27/12   Corwin Levins, MD  HYDROcodone-acetaminophen (NORCO/VICODIN) 5-325 MG per tablet Take 1 tablet by mouth 3 (three) times daily as needed for pain. 06/29/12   Corwin Levins, MD   Physical Exam: Filed Vitals:   12/27/12 1330  BP: 176/84  Pulse: 99  Temp: 97.8 F (36.6 C)  Resp: 18     General:  Mild distress due to pain on her right hip, cooperative to examination, tachypneic  Eyes: PERRL, EOMI, no icterus  ENT: MMM, no thrush, no erythema or exudates, no drainage from ears or nostrils  Neck: no JVD, no bruits, no thyromegaly  Cardiovascular: S1 and S2, mild tachycardia, no rubs, no gallops  Respiratory: diffuse rhonchi, positive exp wheezing, no crackles  Abdomen: soft, NT,ND, positive BS,  Skin: no rash or petechiae   Musculoskeletal: right hip pain with movement, no edema, no cyanosis. Decrease range of motion RLE  Psychiatric: no SI, no hallucinations  Neurologic: CN intact, no focal sensory deficit; AAOX2; normal finger to nose  Labs on Admission:  Basic Metabolic Panel:  Recent Labs Lab 12/27/12 0500  NA 138  K 3.4*  CL 102  CO2 21  GLUCOSE 181*  BUN 8  CREATININE 0.66  CALCIUM 8.7   CBC:  Recent Labs Lab 12/27/12 0655  WBC 14.8*  NEUTROABS 12.3*  HGB 12.3  HCT 36.4  MCV 84.7  PLT 401*   Cardiac Enzymes:  Recent Labs Lab 12/27/12 0500  TROPONINI <0.30    BNP (last 3 results)  Recent Labs  05/23/12 2000  PROBNP 719.0*   Radiological Exams on Admission: Dg Chest 1 View  12/27/2012   *RADIOLOGY REPORT*  Clinical Data: Chest pain.  CHEST - 1 VIEW  Comparison: Chest radiograph performed 08/06/2012  Findings: The lungs are well-aerated.  Minimal nodular opacity at the right midlung zone could reflect mild pneumonia.  Vascular congestion is noted, without definite pulmonary edema.  There is no evidence of pleural effusion  or pneumothorax.  The cardiomediastinal silhouette is borderline enlarged.  No acute osseous abnormalities are seen.  IMPRESSION:  1.  Minimal nodular opacity at the right midlung zone could reflect mild pneumonia. 2.  Vascular congestion and borderline cardiomegaly, without definite pulmonary edema.   Original Report Authenticated By: Tonia Ghent, M.D.   Dg Pelvis 1-2 Views  12/27/2012   CLINICAL DATA:  Larey Seat last night, pelvic rim fracture  EXAM: PELVIS - 1-2 VIEW  COMPARISON:  08/05/2011  FINDINGS: Disuse osteopenia. No displaced fractures identified. Pelvic ring appears to be intact without acute abnormality.  IMPRESSION: No fractures identified.  Electronically Signed   By: Esperanza Heir M.D.   On: 12/27/2012 09:02   Dg Hip Complete Right  12/27/2012   *RADIOLOGY REPORT*  Clinical Data: Status post fall; right hip pain.  RIGHT HIP - COMPLETE 2+ VIEW  Comparison: None.  Findings: There appears to be a displaced fracture extending across the lateral aspect of the right superior pubic ramus, likely extending into the acetabulum.  Both femoral heads are seated normally within their respective acetabula.  The proximal right femur appears intact.  Mild degenerative change is noted at the lower lumbar spine.  The sacroiliac joints are unremarkable in appearance.  The visualized bowel gas pattern is grossly unremarkable in appearance.  Scattered phleboliths are noted within the pelvis. Diffuse vascular calcifications are seen.  IMPRESSION:  1.  Displaced fracture extending across the lateral aspect of the right superior pubic ramus, likely extending into the right acetabulum. 2.  Diffuse vascular calcifications seen.   Original Report Authenticated By: Tonia Ghent, M.D.    EKG:  Ventricular Rate: 88  PR Interval: 191  QRS Duration: 124  QT Interval: 396  QTC Calculation: 479  R Axis: -25  Text Interpretation: Sinus rhythm Left bundle branch block No new changes  Assessment/Plan 1-Pelvic  fracture: closed and secondary to mechanical fall. Per orthopedic service might not required surgical intervention. -will follow rec's -PRN analgesics and once clear by ortho will get PT/OT eval  2-bronchitis/early PNA: patient without fever, but with new cough, elevated WBC's and tachypnea. -will provide treatment for CAP -rocephin and doxycycline -continue supplemental oxygen -PRN nebulizers  3-acute on chronic resp failure: due to mild exacerbation of COPD and bronchitis/PNA -will treat as mentioned above with abx's, nebulizer tx, oxygen supplementation and will also use prednisone. -follow clinical response  4-DIABETES MELLITUS, TYPE II: continue lantus and SSI. -Will check A1C  5-HYPERLIPIDEMIA: continue statins  6-DEMENTIA: continue supportive regimena nd home meds  7-DEPRESSION: stable. No SI. -continue celexa  8-HYPERTENSION: fair controlled. Pain contributing to BP elevation. -control pain -Will continue home meds. -follow VS and adjust meds/use PRN antihypertensive agents as needed  9-COPD with exacerbation: treatment as mentioned above -pulmicort -Duo Nebs -abx's and prednisone -patient chronically on oxygen  10-hypokalemia: will replete electrolytes. Will check Mg and BMET in am  11-GERD: continue PPI and pepcid  12-Hx of stroke: no new deficit. Continue ASA for secondary prevention.  DVT: heparin  Orthopedic service (Dr. Carola Frost; contacted by ED)  Code Status: DNR Family Communication: no family at bedside Disposition Plan: inpatient, LOS > 2 midnights, med-surg   Time spent: 50 minutes  Chastelyn Athens [facility] Pager [phone]  If 7PM-7AM, please contact night-coverage [email or web address] Password [email or web address] 12/27/2012, 2:57 PM

## 2012-12-27 NOTE — Progress Notes (Signed)
Rec'd order for sputum collection. Pt was alseep, I left sterile cup and spoke w/ daughter in room re: sputum collection need for pt.  RN notified of above.

## 2012-12-27 NOTE — Progress Notes (Signed)
PT SBP 180s . BP did not rec BP med until 1830. Will inform Night RN to check bp between 1610-9604

## 2012-12-27 NOTE — ED Notes (Signed)
Patient presents to ED via [facility]. EMS was called due to patient falling. Pt was attempting to get out of chair and slipped, falling to her bottom. Pt denies hitting her head or any LOC at that time. EMS arrived, helped patient up and patient had a syncopal episode. Pt was cool, clammy and BP was 84 palpable. Per EMS pt regained consciousness quickly and was A&Ox4. EMS gave pt 4mg  of zofran via IV due to patient being nauseous. Upon arrival to ED patient is A&Ox4 and c/o of right hip pain. No rotation or shortening noted at this time.

## 2012-12-27 NOTE — Progress Notes (Signed)
Pt o3x, Pt having Rt hip pain , PRN given 951 in ED> MD MC paged Pt on floor. Will continue to monitor

## 2012-12-27 NOTE — ED Provider Notes (Signed)
CSN: [id number]     Arrival date & time 12/27/12  4098 History   First MD Initiated Contact with Patient 12/27/12 0448     Chief Complaint  Patient presents with  . Fall  . Loss of Consciousness   (Consider location/radiation/quality/duration/timing/severity/associated sxs/prior Treatment) HPI Comments: Pt is an 79 y/o female who comes in to the ER with cc of fall. Pt has hx of COPD, IDDM. Dementia, no known CAD. Pt reports waking up to go to the bathroom, slipping and falling. Pt was in a lot of pain - mostly to the right hip and was unable to get up. When EMS arrived, patient was being helped up, and she passed out. Pt had some nausea, sick to stomach feeling before she passed out, and denies any chest pain, palpitations.   Patient is a 79 y.o. female presenting with fall and syncope. The history is provided by the patient and a caregiver.  Fall Pertinent negatives include no chest pain, no abdominal pain and no shortness of breath.  Loss of Consciousness Associated symptoms: no chest pain, no confusion, no fever, no nausea, no shortness of breath, no vomiting and no weakness     Past Medical History  Diagnosis Date  . Diabetes mellitus type II   . Hyperlipidemia   . HTN (hypertension)   . GERD (gastroesophageal reflux disease)   . Allergic rhinitis   . Depression   . Dementia     mild  . History of CVA (cerebrovascular accident)   . LBBB (left bundle branch block)   . AAA (abdominal aortic aneurysm)     small, last check in 05/2009, due for repeat in 05/2010  . Carotid stenosis 09/2009    40-59% RICA, 0-39% LICA  . LVH (left ventricular hypertrophy) 10/2009    Moderate, EF 55-60%, no regional wall motion abnormalities  . Shellfish allergy     anaphylaxis  . Osteopenia   . Vitamin D deficiency   . Fracture of rib of left side 12/2009  . Asthma 01/27/2011  . Diastolic CHF 08/02/2011   Past Surgical History  Procedure Laterality Date  . Ankle surgery      Right, by  Ortho in [place]  . Tracheostomy  2010    anaphylactic episode on vent  . Tubal ligation     Family History  Problem Relation Age of Onset  . Rheum arthritis Sister   . Stroke Brother   . Hypertension Brother   . Diabetes type II Other     2 siblings   History  Substance Use Topics  . Smoking status: Current Every Day Smoker  . Smokeless tobacco: Not on file     Comment: 3/4 ppd  . Alcohol Use: No   OB History   Grav Para Term Preterm Abortions TAB SAB Ect Mult Living                 Review of Systems  Constitutional: Negative for fever and activity change.  HENT: Negative for facial swelling.   Respiratory: Negative for cough, shortness of breath and wheezing.   Cardiovascular: Positive for syncope. Negative for chest pain.  Gastrointestinal: Negative for nausea, vomiting, abdominal pain, diarrhea, constipation, blood in stool and abdominal distention.  Genitourinary: Negative for hematuria and difficulty urinating.  Musculoskeletal: Positive for arthralgias, back pain, gait problem and myalgias. Negative for neck pain.  Skin: Negative for color change.  Neurological: Positive for syncope. Negative for speech difficulty and weakness.  Hematological: Does not bruise/bleed easily.  Psychiatric/Behavioral: Negative for confusion.    Allergies  Ibuprofen; Shellfish-derived products; Ace inhibitors; and Sulfa antibiotics  Home Medications   Current Outpatient Rx  Name  Route  Sig  Dispense  Refill  . aspirin 81 MG EC tablet   Oral   Take 81 mg by mouth every morning.          . citalopram (CELEXA) 20 MG tablet   Oral   Take 20 mg by mouth every morning.         . donepezil (ARICEPT) 10 MG tablet   Oral   Take 10 mg by mouth daily.          . famotidine (PEPCID) 40 MG tablet   Oral   Take 40 mg by mouth every morning.         . fexofenadine (ALLEGRA) 180 MG tablet   Oral   Take 1 tablet (180 mg total) by mouth daily.   30 tablet   2   . insulin  glargine (LANTUS) 100 UNIT/ML injection   Subcutaneous   Inject 40 Units into the skin at bedtime.          Marland Kitchen ipratropium-albuterol (DUONEB) 0.5-2.5 (3) MG/3ML SOLN   Nebulization   Take 3 mLs by nebulization every 6 (six) hours as needed.   360 mL   11     Diagnosis code 493.22   . labetalol (NORMODYNE) 200 MG tablet   Oral   Take 200 mg by mouth 2 (two) times daily.         . montelukast (SINGULAIR) 10 MG tablet   Oral   Take 10 mg by mouth at bedtime.         Marland Kitchen NIFEdipine (PROCARDIA-XL/ADALAT-CC/NIFEDICAL-XL) 30 MG 24 hr tablet   Oral   Take 30 mg by mouth every morning.         . risperiDONE (RISPERDAL) 1 MG tablet   Oral   Take 1 mg by mouth 2 (two) times daily.          . CVS SENNA PLUS 8.6-50 MG per tablet      TAKE 1 TO 5 TABLETS ONCE TO TWICE A DAY AS NEEDED FOR CONSTIPATION   60 tablet   11   . HYDROcodone-acetaminophen (NORCO/VICODIN) 5-325 MG per tablet   Oral   Take 1 tablet by mouth 3 (three) times daily as needed for pain.   90 tablet   2    BP 159/74  Pulse 83  Temp(Src) 97.9 F (36.6 C) (Oral)  Resp 24  Ht 5\' 6"  (1.676 m)  Wt 163 lb (73.936 kg)  BMI 26.32 kg/m2  SpO2 97% Physical Exam  Nursing note and vitals reviewed. Constitutional: She is oriented to person, place, and time. She appears well-developed and well-nourished.  HENT:  Head: Normocephalic and atraumatic.  Eyes: EOM are normal. Pupils are equal, round, and reactive to light.  Neck: Neck supple.  Cardiovascular: Normal rate, regular rhythm and normal heart sounds.   No murmur heard. Pulmonary/Chest: Effort normal. No respiratory distress. She has wheezes.  Diffuse rhonchi  Abdominal: Soft. She exhibits no distension. There is no tenderness. There is no rebound and no guarding.  Neurological: She is alert and oriented to person, place, and time.  Skin: Skin is warm and dry.    ED Course  Procedures (including critical care time) Labs Review Labs Reviewed  BASIC  METABOLIC PANEL - Abnormal; Notable for the following:    Potassium 3.4 (*)  Glucose, Bld 181 (*)    GFR calc non Af Amer 81 (*)    All other components within normal limits  TROPONIN I  URINALYSIS, ROUTINE W REFLEX MICROSCOPIC   Imaging Review No results found.  EKG Interpretation     Ventricular Rate:  88 PR Interval:  191 QRS Duration: 124 QT Interval:  396 QTC Calculation: 479 R Axis:   -25 Text Interpretation:  Sinus rhythm Left bundle branch block No new changes            MDM  No diagnosis found.  DDx includes: - Mechanical falls - ICH - Fractures - Contusions - Soft tissue injury  Pt comes in with cc of fall. Pt has a right sided hip fracture, ortho and hospitalist have been paged.  Pt had a syncopal episode, which likely was vasovagal from pain - however, in light of the fracture, bleed also possible. Stable Hb initially and pressures are better here.  7:44 AM Dr. Carola Frost to see patient. Likely non operative. Medicine to admit. CAP tx given  Derwood Kaplan, MD 01/05/13 [phone]

## 2012-12-28 DIAGNOSIS — I10 Essential (primary) hypertension: Secondary | ICD-10-CM

## 2012-12-28 DIAGNOSIS — R5381 Other malaise: Secondary | ICD-10-CM

## 2012-12-28 LAB — RENAL FUNCTION PANEL
Albumin: 3 g/dL — ABNORMAL LOW (ref 3.5–5.2)
BUN: 10 mg/dL (ref 6–23)
Calcium: 8.4 mg/dL (ref 8.4–10.5)
Chloride: 98 mEq/L (ref 96–112)
GFR calc Af Amer: 77 mL/min — ABNORMAL LOW (ref >90)
GFR calc non Af Amer: 67 mL/min — ABNORMAL LOW (ref >90)
Glucose, Bld: 236 mg/dL — ABNORMAL HIGH (ref 70–99)
Phosphorus: 2 mg/dL — ABNORMAL LOW (ref 2.3–4.6)
Potassium: 3.8 mEq/L (ref 3.5–5.1)
Sodium: 133 mEq/L — ABNORMAL LOW (ref 135–145)

## 2012-12-28 LAB — LEGIONELLA ANTIGEN, URINE

## 2012-12-28 LAB — GLUCOSE, CAPILLARY: Glucose-Capillary: 242 mg/dL — ABNORMAL HIGH (ref 70–99)

## 2012-12-28 NOTE — Progress Notes (Signed)
UR completed. Daci Stubbe RN CCM Case Mgmt 

## 2012-12-28 NOTE — Progress Notes (Signed)
Physical Therapy Evaluation Patient Details Name: Alicia Kent MRN: [id number] DOB: 1931/01/06 Today's Date: 12/28/2012 Time: 4098-1191 PT Time Calculation (min): 20 min  PT Assessment / Plan / Recommendation History of Present Illness   Pt admit with R LC 1 pelvic ring fx/R pubic rami insufficiency fx.     Clinical Impression  Pt admitted with above. Pt currently with functional limitations due to the deficits listed below (see PT Problem List). Pt will need NHP with therapy to gain strength and balance prior to d/c home.  Pt will benefit from skilled PT to increase their independence and safety with mobility to allow discharge to the venue listed below.     PT Assessment  Patient needs continued PT services    Follow Up Recommendations  SNF;Supervision/Assistance - 24 hour       Barriers to Discharge Decreased caregiver support      Equipment Recommendations  Other (comment) (TBA)         Frequency Min 5X/week    Precautions / Restrictions Precautions Precautions: Fall Restrictions Weight Bearing Restrictions: Yes RLE Weight Bearing: Weight bearing as tolerated   Pertinent Vitals/Pain VSS, right LE pain      Mobility  Bed Mobility Bed Mobility: Supine to Sit;Sitting - Scoot to Edge of Bed Supine to Sit: 3: Mod assist;HOB flat Sitting - Scoot to Edge of Bed: 3: Mod assist;With rail Details for Bed Mobility Assistance: cues for technique and sequencing. Transfers Transfers: Sit to Stand;Stand to Sit;Stand Pivot Transfers Sit to Stand: 1: +2 Total assist;With upper extremity assist;From bed Sit to Stand: Patient Percentage: 50% Stand to Sit: 1: +2 Total assist;With upper extremity assist;To chair/3-in-1;With armrests Stand to Sit: Patient Percentage: 50% Stand Pivot Transfers: 1: +2 Total assist Stand Pivot Transfers: Patient Percentage: 60% Details for Transfer Assistance: Needed cues for hand placement.  Also needed assist to keep feet from sliding.  Assist for  upright trunk as well..Able to weight shift with facilitation.   Ambulation/Gait Ambulation/Gait Assistance: Not tested (comment) Stairs: No Wheelchair Mobility Wheelchair Mobility: No         PT Diagnosis: Generalized weakness;Acute pain  PT Problem List: Decreased activity tolerance;Decreased balance;Decreased mobility;Decreased knowledge of use of DME;Decreased safety awareness;Decreased knowledge of precautions;Pain PT Treatment Interventions: DME instruction;Gait training;Functional mobility training;Therapeutic activities;Therapeutic exercise;Balance training;Patient/family education     PT Goals(Current goals can be found in the care plan section) Acute Rehab PT Goals Patient Stated Goal: to get better PT Goal Formulation: With patient Time For Goal Achievement: 01/11/13 Potential to Achieve Goals: Good  Visit Information  Last PT Received On: 12/28/12 Assistance Needed: +2       Prior Functioning  Home Living Family/patient expects to be discharged to:: Private residence Living Arrangements: Children Available Help at Discharge: Family;Available 24 hours/day Type of Home: House Home Access: Level entry;Ramped entrance Home Layout: Multi-level;Able to live on main level with bedroom/bathroom Home Equipment: Walker - 4 wheels;Bedside commode;Wheelchair - manual;Shower seat (lift chair) Prior Function Level of Independence: Independent with assistive device(s) Communication Communication: No difficulties Dominant Hand: Right    Cognition  Cognition Arousal/Alertness: Awake/alert Behavior During Therapy: WFL for tasks assessed/performed Overall Cognitive Status: History of cognitive impairments - at baseline    Extremity/Trunk Assessment Upper Extremity Assessment Upper Extremity Assessment: Defer to OT evaluation Lower Extremity Assessment Lower Extremity Assessment: RLE deficits/detail;LLE deficits/detail RLE: Unable to fully assess due to pain LLE Deficits  / Details: grossly 3+/5 Cervical / Trunk Assessment Cervical / Trunk Assessment: Normal   Balance Balance Balance  Assessed: Yes Static Standing Balance Static Standing - Balance Support: Bilateral upper extremity supported;During functional activity Static Standing - Level of Assistance: 4: Min assist Static Standing - Comment/# of Minutes: 2  End of Session PT - End of Session Equipment Utilized During Treatment: Gait belt;Oxygen Activity Tolerance: Patient limited by fatigue Patient left: in chair;with call bell/phone within reach Nurse Communication: Mobility status       INGOLD,Eduard Penkala 12/28/2012, 4:36 PM [facility] Acute Rehabilitation [phone] (pager)

## 2012-12-28 NOTE — Progress Notes (Signed)
Patient up in chair today with no complaints. Patient is WBAT and is 1-2 assist with transfers.  Patient complained of pain x1 today with pain medication given for pain.  Patient has no other complaints or concerns this shift. Anastasia Fiedler RN

## 2012-12-28 NOTE — Progress Notes (Signed)
[facility]  Pt seen and examined Consult dictated- #[id number]  Clinical exam is stable from musculoskeletal standpoint Pt with very minimal physical activity  fx likely that of insufficiency pattern Suspect osteoporosis  Assessment and Plan  R LC 1 pelvic ring fx/R pubic rami insufficiency fx  WBAT  PT/OT evals  Ice prn  Pain management- limit narcs  Will check vitamin d levels  Consider pharmacologic osteoporosis agent as outpt    Would anticipate pt will be ready for snf in 2-3 days  Mearl Latin, PA-C [facility] [phone] (P) 12/28/2012 11:06 AM

## 2012-12-28 NOTE — Progress Notes (Signed)
[facility] PROGRESS NOTE  Alicia Kent UJW:[id number] DOB: 05-12-1930 DOA: 12/27/2012 PCP: Oliver Barre, MD  Assessment/Plan: 1-Pelvic fracture: closed and secondary to mechanical fall. Per orthopedic service no surgical intervention required.  -WAT -PT/OT -PRN analgesics -check Vit D  2-bronchitis/early PNA: patient without fever, but with new cough, elevated WBC's and tachypnea.  -will provide treatment for CAP  -continue rocephin and doxycycline  -continue supplemental oxygen  -PRN nebulizers  -breathing improved and RR now WNL  3-acute on chronic resp failure: due to mild exacerbation of COPD and bronchitis/PNA  -will treat as mentioned above with abx's, nebulizer tx, oxygen supplementation and will also use prednisone.  -significant improvement in SOB and RR  4-DIABETES MELLITUS, TYPE II: continue lantus and SSI.  -A1C 6.0  5-HYPERLIPIDEMIA: continue statins   6-DEMENTIA: continue supportive regimena nd home meds   7-DEPRESSION: stable. No SI.  -continue celexa   8-HYPERTENSION: fair controlled. Pain contributing to BP elevation.  -improved control now that pain is under better control  -Will continue home meds.  -follow VS and adjust meds/use PRN antihypertensive agents as needed   9-COPD with exacerbation: treatment as mentioned above  -pulmicort  -Duo Nebs  -abx's and prednisone  -patient chronically on oxygen  -good O 2 sat at home requirements  10-hypokalemia: Repleted.  11-GERD: continue PPI and pepcid   12-Hx of stroke: no new deficit. Continue ASA for secondary prevention.   DVT: heparin   Code Status: DNR Family Communication: Daughter at bedside Disposition Plan: might need SNF    Consultants:  Orthopedic service   Procedures:  See below for x-ray reports  Antibiotics:  Rocephin and doxycycline  HPI/Subjective: Feeling better, no CP, reports breathing has significantly improved.  Objective: Filed Vitals:   12/28/12 1837   BP: 129/55  Pulse: 73  Temp: 98.3 F (36.8 C)  Resp: 19    Intake/Output Summary (Last 24 hours) at 12/28/12 1938 Last data filed at 12/28/12 1524  Gross per 24 hour  Intake 1687.5 ml  Output   1225 ml  Net  462.5 ml   Filed Weights   12/27/12 0456 12/27/12 1104 12/28/12 0503  Weight: 73.936 kg (163 lb) 72.349 kg (159 lb 8 oz) 74.208 kg (163 lb 9.6 oz)    Exam:   General:  Feeling better, reports pain is significantly improved  Cardiovascular: S1 and S2, no rubs or gallops  Respiratory: improved air movement, still slight exp wheezing and scattered rhonchi  Abdomen: soft, NT, ND, positive BS  Musculoskeletal: no edema, no cyanosis or clubbing; RLE decrease movement due to pain.  Data Reviewed: Basic Metabolic Panel:  Recent Labs Lab 12/27/12 0500 12/28/12 1351  NA 138 133*  K 3.4* 3.8  CL 102 98  CO2 21 21  GLUCOSE 181* 236*  BUN 8 10  CREATININE 0.66 0.81  CALCIUM 8.7 8.4  PHOS  --  2.0*   Liver Function Tests:  Recent Labs Lab 12/28/12 1351  ALBUMIN 3.0*   CBC:  Recent Labs Lab 12/27/12 0655  WBC 14.8*  NEUTROABS 12.3*  HGB 12.3  HCT 36.4  MCV 84.7  PLT 401*   Cardiac Enzymes:  Recent Labs Lab 12/27/12 0500  TROPONINI <0.30   BNP (last 3 results)  Recent Labs  05/23/12 2000  PROBNP 719.0*   CBG:  Recent Labs Lab 12/27/12 1604 12/27/12 2158 12/28/12 0617 12/28/12 1153 12/28/12 1629  GLUCAP 132* 193* 190* 242* 196*    Studies: Dg Chest 1 View  12/27/2012   *RADIOLOGY REPORT*  Clinical Data: Chest pain.  CHEST - 1 VIEW  Comparison: Chest radiograph performed 08/06/2012  Findings: The lungs are well-aerated.  Minimal nodular opacity at the right midlung zone could reflect mild pneumonia.  Vascular congestion is noted, without definite pulmonary edema.  There is no evidence of pleural effusion or pneumothorax.  The cardiomediastinal silhouette is borderline enlarged.  No acute osseous abnormalities are seen.  IMPRESSION:   1.  Minimal nodular opacity at the right midlung zone could reflect mild pneumonia. 2.  Vascular congestion and borderline cardiomegaly, without definite pulmonary edema.   Original Report Authenticated By: Tonia Ghent, M.D.   Dg Pelvis 1-2 Views  12/27/2012   CLINICAL DATA:  Larey Seat last night, pelvic rim fracture  EXAM: PELVIS - 1-2 VIEW  COMPARISON:  08/05/2011  FINDINGS: Disuse osteopenia. No displaced fractures identified. Pelvic ring appears to be intact without acute abnormality.  IMPRESSION: No fractures identified.   Electronically Signed   By: Esperanza Heir M.D.   On: 12/27/2012 09:02   Dg Hip Complete Right  12/27/2012   *RADIOLOGY REPORT*  Clinical Data: Status post fall; right hip pain.  RIGHT HIP - COMPLETE 2+ VIEW  Comparison: None.  Findings: There appears to be a displaced fracture extending across the lateral aspect of the right superior pubic ramus, likely extending into the acetabulum.  Both femoral heads are seated normally within their respective acetabula.  The proximal right femur appears intact.  Mild degenerative change is noted at the lower lumbar spine.  The sacroiliac joints are unremarkable in appearance.  The visualized bowel gas pattern is grossly unremarkable in appearance.  Scattered phleboliths are noted within the pelvis. Diffuse vascular calcifications are seen.  IMPRESSION:  1.  Displaced fracture extending across the lateral aspect of the right superior pubic ramus, likely extending into the right acetabulum. 2.  Diffuse vascular calcifications seen.   Original Report Authenticated By: Tonia Ghent, M.D.    Scheduled Meds: . aspirin EC  81 mg Oral Daily  . budesonide (PULMICORT) nebulizer solution  0.25 mg Nebulization BID  . cefTRIAXone (ROCEPHIN)  IV  1 g Intravenous Q24H  . citalopram  20 mg Oral q morning - 10a  . docusate sodium  50 mg Oral BID  . donepezil  10 mg Oral QHS  . doxycycline (VIBRAMYCIN) IV  100 mg Intravenous Q12H  . famotidine  40 mg  Oral q morning - 10a  . heparin  5,000 Units Subcutaneous Q8H  . insulin aspart  0-15 Units Subcutaneous TID WC  . insulin glargine  25 Units Subcutaneous QHS  . labetalol  200 mg Oral BID  . loratadine  10 mg Oral Daily  . montelukast  10 mg Oral QHS  . NIFEdipine  30 mg Oral q morning - 10a  . pantoprazole  40 mg Oral Q1200  . polyethylene glycol  17 g Oral Daily  . predniSONE  40 mg Oral BID WC  . risperiDONE  1 mg Oral BID   Continuous Infusions:   Principal Problem:   Pelvic fracture Active Problems:   DIABETES MELLITUS, TYPE II   HYPERLIPIDEMIA   DEMENTIA   DEPRESSION   HYPERTENSION   CAP (community acquired pneumonia)   COPD, severity to be determined   COPD exacerbation    Time spent: >30 minutes   Dakarri Kessinger  [facility] Pager [phone]. If 7PM-7AM, please contact night-coverage at [email or web address], password [facility] 12/28/2012, 7:38 PM  LOS: 1 day

## 2012-12-29 DIAGNOSIS — D649 Anemia, unspecified: Secondary | ICD-10-CM

## 2012-12-29 LAB — CBC
MCH: 28.8 pg (ref 26.0–34.0)
MCHC: 33.5 g/dL (ref 30.0–36.0)
Platelets: 349 10*3/uL (ref 150–400)
RBC: 3.78 MIL/uL — ABNORMAL LOW (ref 3.87–5.11)
RDW: 14 % (ref 11.5–15.5)

## 2012-12-29 LAB — GLUCOSE, CAPILLARY: Glucose-Capillary: 167 mg/dL — ABNORMAL HIGH (ref 70–99)

## 2012-12-29 LAB — BASIC METABOLIC PANEL
BUN: 17 mg/dL (ref 6–23)
CO2: 22 mEq/L (ref 19–32)
Calcium: 8.5 mg/dL (ref 8.4–10.5)
Creatinine, Ser: 0.74 mg/dL (ref 0.50–1.10)
GFR calc Af Amer: >90 mL/min (ref >90)
GFR calc non Af Amer: 78 mL/min — ABNORMAL LOW (ref >90)
Glucose, Bld: 230 mg/dL — ABNORMAL HIGH (ref 70–99)
Sodium: 135 mEq/L (ref 135–145)

## 2012-12-29 MED ORDER — PREDNISONE 20 MG PO TABS: ORAL_TABLET | ORAL | Status: DC

## 2012-12-29 MED ORDER — PANTOPRAZOLE SODIUM 40 MG PO TBEC: 40.0000 mg | DELAYED_RELEASE_TABLET | Freq: Every day | ORAL | Status: DC

## 2012-12-29 MED ORDER — HYDROCODONE-ACETAMINOPHEN 5-325 MG PO TABS: 1.0000 | ORAL_TABLET | Freq: Four times a day (QID) | ORAL | Status: DC | PRN

## 2012-12-29 MED ORDER — BUDESONIDE 0.25 MG/2ML IN SUSP: 0.2500 mg | Freq: Two times a day (BID) | RESPIRATORY_TRACT | Status: DC

## 2012-12-29 MED ORDER — DOCUSATE SODIUM 50 MG PO CAPS: 100.0000 mg | ORAL_CAPSULE | Freq: Two times a day (BID) | ORAL | Status: DC

## 2012-12-29 MED ORDER — CALCIUM CARBONATE-VITAMIN D 500-200 MG-UNIT PO TABS: 2.0000 | ORAL_TABLET | Freq: Every day | ORAL | Status: DC

## 2012-12-29 MED ORDER — ERGOCALCIFEROL 1.25 MG (50000 UT) PO CAPS: 50000.0000 [IU] | ORAL_CAPSULE | ORAL | Status: DC

## 2012-12-29 MED ORDER — CALCIUM CITRATE-VITAMIN D 250-100 MG-UNIT PO TABS: 2.0000 | ORAL_TABLET | Freq: Two times a day (BID) | ORAL | Status: DC

## 2012-12-29 MED ORDER — VITAMIN C 500 MG PO TABS: 500.0000 mg | ORAL_TABLET | Freq: Every day | ORAL | Status: DC

## 2012-12-29 MED ORDER — DOXYCYCLINE HYCLATE 50 MG PO CAPS: 100.0000 mg | ORAL_CAPSULE | Freq: Two times a day (BID) | ORAL | Status: AC

## 2012-12-29 MED ORDER — POLYETHYLENE GLYCOL 3350 17 G PO PACK: 17.0000 g | PACK | Freq: Every day | ORAL | Status: DC

## 2012-12-29 NOTE — Progress Notes (Addendum)
Inpatient Diabetes Program Recommendations  AACE/ADA: New Consensus Statement on Inpatient Glycemic Control (2013)  Target Ranges:  Prepandial:   less than 140 mg/dL      Peak postprandial:   less than 180 mg/dL (1-2 hours)      Critically ill patients:  140 - 180 mg/dL   Reason for Visit: Results for Alicia Kent, Alicia Kent (MRN [id number]) as of 12/29/2012 10:45  Ref. Range 12/28/2012 06:17 12/28/2012 11:53 12/28/2012 16:29 12/28/2012 22:07 12/29/2012 05:52  Glucose-Capillary Latest Range: 70-99 mg/dL 130 (H) 865 (H) 784 (H) 190 (H) 209 (H)   Consider adding Novolog meal coverage 4 units tid with meals.  Also please increase Lantus to 30 units daily.   A1C=6.0% indicating good outpatient diabetes control.  Beryl Meager, RN, BC-ADM Inpatient Diabetes Coordinator Pager [phone]

## 2012-12-29 NOTE — Discharge Summary (Addendum)
Physician Discharge Summary  Alicia Kent UJW:[id number] DOB: 1930/09/19 DOA: 12/27/2012  PCP: Oliver Barre, MD  Admit date: 12/27/2012 Discharge date: 12/30/2012  Time spent: >30 minutes  Recommendations for Outpatient Follow-up:  BMET to follow electrolytes and renal function  Discharge Diagnoses:  Non displaced Pelvic fracture (pubic ramus) DIABETES MELLITUS, TYPE II HYPERLIPIDEMIA DEMENTIA DEPRESSION HYPERTENSION CAP (community acquired pneumonia) COPD, severity to be determined COPD exacerbation Vit D deficiency   Discharge Condition: stable and improved. Will discharge to SNF for physical rehab.  Diet recommendation: heart healthy diet  Filed Weights   12/27/12 1104 12/28/12 0503 12/29/12 0541  Weight: 72.349 kg (159 lb 8 oz) 74.208 kg (163 lb 9.6 oz) 73.7 kg (162 lb 7.7 oz)    History of present illness:  79 y.o. female with pmh significant for HTN, HLD, DM on insulin, Chronic resp failure due to COPD, dementia, depression, hx of CVA and GERD; came to ED after experiencing mechanical fall and complaining of severe right hip pain. Patient reports tripping earlythis morning in her way to the bathroom and subsequently having a lot of pain in her right hip and trouble with weight bearing and movement. Patient also endorses some dry cough and slight increase in SOB. Denies CP, fever, chills, abd pain, dysuria, hematemesis, melena or any other acute complaints.  In ED hip x-ray demonstrated hip fracture, CXR suggested early PNA; patient tachypneic with RR in the 40's and elevated WBC's. Orthopedic service consulted and [facility] called to admit for further eval and treatment.   Hospital Course:  1-Pelvic fracture: closed and secondary to mechanical fall. Per orthopedic service no surgical intervention required.  -WAT  -PT/OT  -PRN analgesics  -Vit D supplementation -will benefit of dexa scan in outpatient setting and most likely agents for osteoporosis  2-bronchitis/early PNA:  patient without fever, but with new cough, elevated WBC's and tachypnea.  -will provide treatment for CAP  -continue doxycycline as instructed to finish abx's therapy -continue supplemental oxygen (chronically on it) -PRN nebulizers  -breathing improved and RR now WNL  -will add pulmicort inhaler to her regimen for better control  3-acute on chronic resp failure: due to mild exacerbation of COPD and bronchitis/PNA  -will treat as mentioned above with abx's, nebulizer tx, oxygen supplementation and will also use prednisone.  -significant improvement in SOB and RR  -pulmicort added long term for maintenance  4-DIABETES MELLITUS, TYPE II: continue lantus and carb modified diet.  -A1C 6.0   5-HYPERLIPIDEMIA: continue statins   6-DEMENTIA: continue supportive regimena nd home meds   7-DEPRESSION: stable. No SI.  -continue celexa   8-HYPERTENSION: fair controlled. Pain contributing to BP elevation.  -improved control now that pain is under better control  -Will continue home meds.  -follow low sodium diet  9-COPD with exacerbation: treatment as mentioned above  -pulmicort  -Duo Nebs  -abx's and prednisone  -patient chronically on oxygen  -good O 2 sat at home requirements   10-hypokalemia: Repleted.   11-GERD: continue PPI and pepcid   12-Hx of stroke: no new deficit. Continue ASA for secondary prevention.  13-Vit D deficiency: will start repletion with high dose vit D, with subsequent Vit D3 1000mg  daily and also vit C daily. -needs dexa scan as an outpatient and most likely agents for osteoporosis.   Procedures:  See below for x-ray reports  Consultations:  Orthopedic service  Discharge Exam: Filed Vitals:   12/29/12 1113  BP: 138/50  Pulse: 79  Temp:   Resp:    General:  Feeling better, reports pain is significantly improved  Cardiovascular: S1 and S2, no rubs or gallops  Respiratory: improved air movement, no wheezing  and just scattered rhonchi  Abdomen:  soft, NT, ND, positive BS  Musculoskeletal: no edema, no cyanosis or clubbing; RLE decrease movement due to pain.  Discharge Instructions  Discharge Orders   Future Appointments Provider Department Dept Phone   03/19/2013 3:45 PM Corwin Levins, MD [facility] [phone]   Future Orders Complete By Expires   Diet - low sodium heart healthy  As directed    Discharge instructions  As directed    Comments:     Physical therapy as per facility protocol Take medications as described Keep yourself hydrated Weight bearing as tolerated       Medication List    STOP taking these medications       [employer] SENNA PLUS 8.6-50 MG per tablet  Generic drug:  senna-docusate      TAKE these medications       aspirin 81 MG EC tablet  Take 81 mg by mouth every morning.     budesonide 0.25 MG/2ML nebulizer solution  Commonly known as:  PULMICORT  Take 2 mLs (0.25 mg total) by nebulization 2 (two) times daily.     calcium-vitamin D 250-100 MG-UNIT per tablet  Take 2 tablets by mouth 2 (two) times daily.     calcium-vitamin D 500-200 MG-UNIT per tablet  Commonly known as:  OSCAL 500/200 D-3  Take 2 tablets by mouth daily with breakfast. To start after finishing high dose 8 weeks treatment with Vit D 2  Start taking on:  02/23/2013     citalopram 20 MG tablet  Commonly known as:  CELEXA  Take 20 mg by mouth every morning.     docusate sodium 50 MG capsule  Commonly known as:  COLACE  Take 2 capsules (100 mg total) by mouth 2 (two) times daily.     donepezil 10 MG tablet  Commonly known as:  ARICEPT  Take 10 mg by mouth daily.     doxycycline 50 MG capsule  Commonly known as:  VIBRAMYCIN  Take 2 capsules (100 mg total) by mouth 2 (two) times daily. Take for 4 more days     ergocalciferol 50000 UNITS capsule  Commonly known as:  VITAMIN D2  Take 1 capsule (50,000 Units total) by mouth once a week.     famotidine 40 MG tablet  Commonly known as:  PEPCID   Take 40 mg by mouth every morning.     fexofenadine 180 MG tablet  Commonly known as:  ALLEGRA  Take 1 tablet (180 mg total) by mouth daily.     HYDROcodone-acetaminophen 5-325 MG per tablet  Commonly known as:  NORCO/VICODIN  Take 1-2 tablets by mouth every 6 (six) hours as needed for pain.     insulin glargine 100 UNIT/ML injection  Commonly known as:  LANTUS  Inject 40 Units into the skin at bedtime.     ipratropium-albuterol 0.5-2.5 (3) MG/3ML Soln  Commonly known as:  DUONEB  Take 3 mLs by nebulization every 6 (six) hours as needed.     labetalol 200 MG tablet  Commonly known as:  NORMODYNE  Take 200 mg by mouth 2 (two) times daily.     montelukast 10 MG tablet  Commonly known as:  SINGULAIR  Take 10 mg by mouth at bedtime.     NIFEdipine 30 MG 24 hr tablet  Commonly known  as:  PROCARDIA-XL/ADALAT-CC/NIFEDICAL-XL  Take 30 mg by mouth every morning.     pantoprazole 40 MG tablet  Commonly known as:  PROTONIX  Take 1 tablet (40 mg total) by mouth daily at 12 noon.     polyethylene glycol packet  Commonly known as:  MIRALAX / GLYCOLAX  Take 17 g by mouth daily.     predniSONE 20 MG tablet  Commonly known as:  DELTASONE  Take 3 tabs by mouth daily X 1 day, then 2 tabs by mouth daily X 2 days; then 1 tab by mouth daily X 2 days; then 1/2 tablet by mouth daily X 2 days and then stop prednisone     risperiDONE 1 MG tablet  Commonly known as:  RISPERDAL  Take 1 mg by mouth 2 (two) times daily.     vitamin C 500 MG tablet  Commonly known as:  ASCORBIC ACID  Take 1 tablet (500 mg total) by mouth daily.       Allergies  Allergen Reactions  . Ibuprofen Anaphylaxis  . Shellfish-Derived Products Anaphylaxis    Swelling tongue and throat  . Ace Inhibitors Other (See Comments)    unknown  . Sulfa Antibiotics Nausea And Vomiting      The results of significant diagnostics from this hospitalization (including imaging, microbiology, ancillary and laboratory) are  listed below for reference.    Significant Diagnostic Studies: Dg Chest 1 View  12/27/2012   *RADIOLOGY REPORT*  Clinical Data: Chest pain.  CHEST - 1 VIEW  Comparison: Chest radiograph performed 08/06/2012  Findings: The lungs are well-aerated.  Minimal nodular opacity at the right midlung zone could reflect mild pneumonia.  Vascular congestion is noted, without definite pulmonary edema.  There is no evidence of pleural effusion or pneumothorax.  The cardiomediastinal silhouette is borderline enlarged.  No acute osseous abnormalities are seen.  IMPRESSION:  1.  Minimal nodular opacity at the right midlung zone could reflect mild pneumonia. 2.  Vascular congestion and borderline cardiomegaly, without definite pulmonary edema.   Original Report Authenticated By: Tonia Ghent, M.D.   Dg Pelvis 1-2 Views  12/27/2012   CLINICAL DATA:  Larey Seat last night, pelvic rim fracture  EXAM: PELVIS - 1-2 VIEW  COMPARISON:  08/05/2011  FINDINGS: Disuse osteopenia. No displaced fractures identified. Pelvic ring appears to be intact without acute abnormality.  IMPRESSION: No fractures identified.   Electronically Signed   By: Esperanza Heir M.D.   On: 12/27/2012 09:02   Dg Hip Complete Right  12/27/2012   *RADIOLOGY REPORT*  Clinical Data: Status post fall; right hip pain.  RIGHT HIP - COMPLETE 2+ VIEW  Comparison: None.  Findings: There appears to be a displaced fracture extending across the lateral aspect of the right superior pubic ramus, likely extending into the acetabulum.  Both femoral heads are seated normally within their respective acetabula.  The proximal right femur appears intact.  Mild degenerative change is noted at the lower lumbar spine.  The sacroiliac joints are unremarkable in appearance.  The visualized bowel gas pattern is grossly unremarkable in appearance.  Scattered phleboliths are noted within the pelvis. Diffuse vascular calcifications are seen.  IMPRESSION:  1.  Displaced fracture extending  across the lateral aspect of the right superior pubic ramus, likely extending into the right acetabulum. 2.  Diffuse vascular calcifications seen.   Original Report Authenticated By: Tonia Ghent, M.D.    Microbiology: No results found for this or any previous visit (from the past 240 hour(s)).   Labs:  Basic Metabolic Panel:  Recent Labs Lab 12/27/12 0500 12/28/12 1351 12/29/12 0525  NA 138 133* 135  K 3.4* 3.8 4.1  CL 102 98 99  CO2 21 21 22   GLUCOSE 181* 236* 230*  BUN 8 10 17   CREATININE 0.66 0.81 0.74  CALCIUM 8.7 8.4 8.5  PHOS  --  2.0*  --    Liver Function Tests:  Recent Labs Lab 12/28/12 1351  ALBUMIN 3.0*   CBC:  Recent Labs Lab 12/27/12 0655 12/29/12 0525  WBC 14.8* 9.3  NEUTROABS 12.3*  --   HGB 12.3 10.9*  HCT 36.4 32.5*  MCV 84.7 86.0  PLT 401* 349   Cardiac Enzymes:  Recent Labs Lab 12/27/12 0500  TROPONINI <0.30   BNP: BNP (last 3 results)  Recent Labs  05/23/12 2000  PROBNP 719.0*   CBG:  Recent Labs Lab 12/28/12 1153 12/28/12 1629 12/28/12 2207 12/29/12 0552 12/29/12 1125  GLUCAP 242* 196* 190* 209* 202*    Signed:  Phebe Dettmer  [facility] 12/29/2012, 12:45 PM

## 2012-12-29 NOTE — Clinical Social Work Psychosocial (Signed)
     Clinical Social Work Department BRIEF PSYCHOSOCIAL ASSESSMENT 12/29/2012  Patient:  Alicia Kent, Alicia Kent     Account Number:  [id number]     Admit date:  12/27/2012  Clinical Social Worker:  Tiburcio Pea  Date/Time:  12/29/2012 05:35 PM  Referred by:  Physician  Date Referred:  12/28/2012 Referred for  SNF Placement   Other Referral:   Interview type:  Other - See comment Other interview type:   Patient defers to her daughter Alicia Kent    PSYCHOSOCIAL DATA Living Status:  FAMILY Admitted from facility:   Level of care:   Primary support name:  Darcel Smalling  [phone] Primary support relationship to patient:  CHILD, ADULT Degree of support available:   Strong support    CURRENT CONCERNS Current Concerns  Post-Acute Placement   Other Concerns:    SOCIAL WORK ASSESSMENT / PLAN 79 year old female- admitted from home where she lives with her daughter Alicia Kent. Patient fell and now has a fractured pelvis.  PT recommends short term SNF and daughter agrees. Patient is alert but has some noted confusion- requested that CSW talk to her daughter  about d/c plan. Per MD- patient is medicaly stable for d/c.  Daughter is hoping for placement at either [facility] or [facility] but agreed to full SNF search.  Daughter had a procedure on her back this morning and cannot drive or travel today. She will be able to have full  movement tomorrow and agrees to sign papers at facility tomorrow. CSW will follow up with daughter in the a.m. to give final bed offers. Fl2 on chart for MD's signature.   Assessment/plan status:  Psychosocial Support/Ongoing Assessment of Needs Other assessment/ plan:   Information/referral to community resources:   SNF bed list provided to patient for her daughter's review.    PATIENTS/FAMILYS RESPONSE TO PLAN OF CARE: Patient is alert to person and is intermittently to place and time. She defers all decisions to her daughter Alicia Kent. She is agreeable to SNF for rehab  if indicated.  Patient's daughter Alicia Kent agrees to SNF. Plan d/c in the a.m. once bed is chosen.  Lorri Frederick. Nathaniel Yaden, LCSWA  [phone]

## 2012-12-29 NOTE — Consult Note (Signed)
Alicia Kent, Alicia Kent                   ACCOUNT NO.:  [id number]  MEDICAL RECORD NO.:  [id number]  LOCATION:  4E27C                        FACILITY:  [facility]  PHYSICIAN:  Mearl Latin, PA       DATE OF BIRTH:  04-13-1930  DATE OF CONSULTATION:  12/28/2012 DATE OF DISCHARGE:                                CONSULTATION   DATE OF INJURY:  December 27, 2012.  REQUESTING PHYSICIAN:  Dr. Rosanna Randy of Internal Medicine.  REASON FOR CONSULTATION:  Right pubic rami fracture, status post ground level fall.  HISTORY OF PRESENT ILLNESS:  Alicia Kent is an 79 year old, white female, with medical history notable for dementia, COPD, type 2 diabetes, hypertension, AAA, vitamin D deficiency, and osteopenia, who sustained a ground level fall early morning on December 27, 2012.  The patient was attempting to get up from bed when she slipped and landed on her right hip.  The patient was brought to [facility] for evaluation.  She was found to have a right pubic rami fracture.  She was admitted to the Medicine Service with orthopedic consultation.  She was seen and evaluated by Medicine and transferred to the 4-East.  The patient lives with her daughter.  She uses a walker for ambulation.  Upon further questioning, the patient has very little activity and uses the walker only to go from her bedroom to the bathroom.  Otherwise, she is using a wheelchair.  Daughter has noticed that over the past several years that her COPD as well as her other medical issues have progressed, given her relative inactivity.  Currently, the patient is in 4-East.  She is a very pleasant, but little confused.  She complains only of right hip/pelvic pain.  Denies any additional injuries elsewhere.  No other acute issues were noted.  The patient denies any chest pain or shortness of breath.  No nausea or vomiting.  No recent illnesses.  No numbness or tingling in her extremities.  She does have restricted motion in  her right ankle due to previous right ankle fracture surgery.  PAST MEDICAL HISTORY: 1. Type 2 diabetes. 2. Hyperlipidemia. 3. Hypertension. 4. GERD. 5. Depression. 6. Dementia. 7. History of CVA. 8. AAA. 9. Carotid stenosis. 10.Osteopenia. 11.Vitamin D deficiency. 12.Diastolic CHF.  SURGICAL HISTORY:  Notable for; 1. ORIF right ankle about 4 years ago in [place]. 2. Tubal ligation.  FAMILY MEDICAL HISTORY:  Notable for rheumatoid arthritis, stroke, hypertension, and type 2 diabetes.  SOCIAL HISTORY:  The patient does continue to smoke daily.  She currently lives with her daughter and grandson.  She is a retired Comptroller.  REVIEW OF SYSTEMS:  As noted above in the HPI.  PHYSICAL EXAMINATION:  VITAL SIGNS:  Temperature 98.0, heart rate 72, respirations 18, 99% on room air.  BP is 133/73. GENERAL:  The patient is very pleasant 79 year old, white female, appears appropriate for stated age.  She is slightly confused, but again is able to carry out the conversation. PELVIS:  Examination of the pelvis does not show any instability.  She does have a little bit of pain with lateral compression of the right hemipelvis. EXTREMITIES:  Bilateral lower  extremities are without any acute findings.  No significant swelling noted bilaterally.  Deep peroneal nerve, superficial peroneal nerve, tibial nerve, sensory function are intact bilaterally.  EHL, FHL, anterior tibialis, posterior tibialis, peroneals, gastrocsoleus complex, motor function intact bilaterally. The patient is able to perform active flexion and extension of the knees bilaterally as well.  Right ankle range of motion is restricted from previous surgery.  Extremities are warm with palpable dorsalis pedis pulses noted bilaterally as well.  Bilateral upper extremities are without any acute findings.  LABORATORY DATA:  Hemoglobin 12.3, hematocrit was 36.4, platelets 401, white blood cells 14.8, sodium 138, potassium 3.4,  chloride 102, bicarb 21, BUN 8, creatinine 0.66, calcium 8.7.  IMAGING:  X-ray, 3 view of her pelvis demonstrates right superior pubic rami fracture without any significant displacement.  No acute fractures identified of the hips as well.  Mild arthritic changes to her hips bilaterally.  The inlet and outlet views of the pelvis also demonstrate pubic rami fracture.  There may be a subtle collapse of the sacral ala on the right, but again without significant displacement or involvement.  ASSESSMENT AND PLAN:  An 79 year old female, status post ground level fall. 1. Fall. 2. Right pelvic ring fracture, LC1, insufficiency type fracture. 3. The patient can be weightbearing as tolerated on her right lower     extremity. 4. She does use a walker at baseline, has very minimal ambulatory     function.  She will work with PT and OT here in the hospital and     then will be transferred to a rehab center for additional therapies     in a couple of days.  We will check some followup films before     discharge as well to ensure that there has been no further     migration of the fracture. 5. Metabolic bone disease.  It would appear that based on this     fracture, the patient clinically has osteoporosis.  Her numerous     medical comorbidities including her chronic obstructive pulmonary     disease, diabetes, as well as her inactivity/minimal weightbearing     activities are contributing to her poor bone density.  We will     recheck her vitamin D levels to ensure that these are adequate.     The patient will also likely benefit from pharmacologic     osteoporosis treatment.  I will calculate her FRAX score, which I     suspect would indicate pharmacologic treatment.  The patient would     likely benefit from an anabolic agent, such as Forteo for a short     period of time to accelerate her healing as well as to increase her     bone density.  The patient will also need a bone density scan as  an     outpatient to further guide therapy as well. 6. Medical issues per primary service. 7. Deep venous thrombosis, pulmonary embolism prophylaxis.  The     patient does not require any formal deep venous thrombosis     prophylaxis upon discharge from the orthopedic standpoint. 8. Disposition:  PT and OT consults.  I suspect the patient will be     stable for transfer to rehab facility in 2-3 days.     Mearl Latin, PA     KWP/MEDQ  D:  12/28/2012  T:  12/29/2012  Job:  [phone]

## 2012-12-29 NOTE — Clinical Social Work Placement (Addendum)
     Clinical Social Work Department CLINICAL SOCIAL WORK PLACEMENT NOTE 01/02/2013  Patient:  Alicia Kent, Alicia Kent  Account Number:  [id number] Admit date:  12/27/2012  Clinical Social Worker:  Lupita Leash Makenize Messman, LCSWA  Date/time:  12/29/2012 05:45 PM  Clinical Social Work is seeking post-discharge placement for this patient at the following level of care:   SKILLED NURSING   (*CSW will update this form in Epic as items are completed)   12/29/2012  Patient/family provided with [facility] Department of Clinical Social Works list of facilities offering this level of care within the geographic area requested by the patient (or if unable, by the patients family).  12/29/2012  Patient/family informed of their freedom to choose among providers that offer the needed level of care, that participate in Medicare, Medicaid or managed care program needed by the patient, have an available bed and are willing to accept the patient.  12/22/2012  Patient/family informed of [facility] ownership interest in [facility], as well as of the fact that they are under no obligation to receive care at this facility.  PASARR submitted to EDS on 12/29/2012 PASARR number received from EDS on 12/29/2012  FL2 transmitted to all facilities in geographic area requested by pt/family on  12/29/2012 FL2 transmitted to all facilities within larger geographic area on   Patient informed that his/her managed care company has contracts with or will negotiate with  certain facilities, including the following:   [facility] Medicare- [place]     Patient/family informed of bed offers received:  12/29/2012 Patient chooses bed at  Physician recommends and patient chooses bed at    Patient to be transferred to  on  12/30/2012 Patient to be transferred to facility by Ambulance  Sharin Mons)  The following physician request were entered in Epic:   Additional Comments: 12/29/12. Plan d/c to SNF 12/31/10. Follow up with daughter in the  morning.  Lorri Frederick. [place]  [phone]  12/30/12 DC to [facility] completed. Pt and daugher-in-law was very pleased. Nursing called report. CSW signing off.  Lorri Frederick. Claudell Rhody, LCSWA [phone]

## 2012-12-29 NOTE — Evaluation (Signed)
Occupational Therapy Evaluation Patient Details Name: Alicia Kent MRN: [id number] DOB: 10-Oct-1930 Today's Date: 12/29/2012 Time: 4098-1191 OT Time Calculation (min): 37 min  OT Assessment / Plan / Recommendation History of present illness Pt admit with R LC 1 pelvic ring fx/R pubic rami insufficiency fx.      Clinical Impression   Pt presents w/ dx as per above, she currently demonstrates significant deficits in functional mobility/transfers as well as w/ ADL independence. She will require SNF Rehab prior to return home w/ 24/7 A, will follow acutely.    OT Assessment  Patient needs continued OT Services    Follow Up Recommendations  SNF    Barriers to Discharge      Equipment Recommendations  None recommended by OT    Recommendations for Other Services    Frequency  Min 2X/week    Precautions / Restrictions Precautions Precautions: Fall;Other (comment) (DNR) Restrictions Weight Bearing Restrictions: Yes RLE Weight Bearing: Weight bearing as tolerated   Pertinent Vitals/Pain 4/10 R hip/pelvic pain. RN made aware that pt is requesting pain meds. Repositioned, rest.    ADL  Eating/Feeding: Simulated;Independent Where Assessed - Eating/Feeding: Chair Grooming: Performed;Wash/dry hands;Wash/dry face;Teeth care;Brushing hair;Set up Where Assessed - Grooming: Supported sitting Upper Body Bathing: Performed;Right arm;Chest;Left arm;Abdomen;Set up Where Assessed - Upper Body Bathing: Supported sitting Lower Body Bathing: Simulated;+2 Total assistance Lower Body Bathing: Patient Percentage: 50% Where Assessed - Lower Body Bathing: Supported sit to stand Upper Body Dressing: Performed;Min guard Where Assessed - Upper Body Dressing: Unsupported sitting Lower Body Dressing: Simulated;+2 Total assistance Lower Body Dressing: Patient Percentage: 50% Where Assessed - Lower Body Dressing: Supported sit to stand Toilet Transfer: Simulated;+1 Total assistance;+2 Total assistance (Sit  to stand from recliner, 2 steps) Toilet Transfer: Patient Percentage: 50% Statistician Method: Sit to Barista: Materials engineer and Hygiene: +2 Total assistance;Simulated Toileting - Architect and Hygiene: Patient Percentage: 0% Where Assessed - Engineer, mining and Hygiene: Standing Tub/Shower Transfer Method: Not assessed Equipment Used: Gait belt;Rolling walker Transfers/Ambulation Related to ADLs: Pt is a heavy +1 total assist for sit to stand from recliner, safer w/ 2+, pt 50% ADL Comments: Pt participated in ADL's this am, she demonstrates significant deficits for functional mobility and transfers as well as w/ ADL independence. She will require SNF Rehab prior to return home w/ 24/7 A, will follow acutely.    OT Diagnosis: Generalized weakness;Acute pain  OT Problem List: Decreased strength;Decreased activity tolerance;Impaired balance (sitting and/or standing);Decreased knowledge of precautions;Decreased knowledge of use of DME or AE;Decreased safety awareness;Decreased cognition;Pain;Cardiopulmonary status limiting activity OT Treatment Interventions: Self-care/ADL training;Therapeutic exercise;Energy conservation;DME and/or AE instruction;Patient/family education;Cognitive remediation/compensation;Therapeutic activities;Balance training   OT Goals(Current goals can be found in the care plan section) Acute Rehab OT Goals Patient Stated Goal: To get better and go home (when able) Time For Goal Achievement: 01/12/13 Potential to Achieve Goals: Good  Visit Information  Last OT Received On: 12/29/12 Assistance Needed: +2 History of Present Illness: Pt admit with R LC 1 pelvic ring fx/R pubic rami insufficiency fx.          Prior Functioning     Home Living Family/patient expects to be discharged to:: Private residence Living Arrangements: Children Available Help at Discharge:  Family;Available 24 hours/day Type of Home: House Home Access: Level entry;Ramped entrance Home Layout: Multi-level;Able to live on main level with bedroom/bathroom Alternate Level Stairs-Number of Steps: Pt reports house has multi level but she can stay on one level  Home Equipment: Walker - 4 wheels;Bedside commode;Wheelchair - manual;Shower seat;Grab bars - tub/shower Prior Function Level of Independence: Needs assistance Gait / Transfers Assistance Needed: Uses RW & W/C; assist to get in/out of car/truck ADL's / Homemaking Assistance Needed: Pt reports "My daughter bathes me and helps me in the shower" Communication Communication: No difficulties Dominant Hand: Right    Vision/Perception Vision - History Baseline Vision: Wears glasses only for reading Visual History: Cataracts (R EYE) Patient Visual Report: No change from baseline   Cognition  Cognition Arousal/Alertness: Awake/alert Behavior During Therapy: WFL for tasks assessed/performed Overall Cognitive Status: History of cognitive impairments - at baseline    Extremity/Trunk Assessment Upper Extremity Assessment Upper Extremity Assessment: Generalized weakness Lower Extremity Assessment Lower Extremity Assessment: Defer to PT evaluation RLE: Unable to fully assess due to pain LLE Deficits / Details: grossly 3+/5 Cervical / Trunk Assessment Cervical / Trunk Assessment: Normal    Mobility Bed Mobility Bed Mobility: Not assessed (Pt up in chair) Details for Bed Mobility Assistance: pt out of bed in chair Transfers Transfers: Sit to Stand;Stand to Sit Sit to Stand: From chair/3-in-1;With armrests;With upper extremity assist;1: +1 Total assist Sit to Stand: Patient Percentage: 50% Stand to Sit: To chair/3-in-1;With upper extremity assist;With armrests;1: +1 Total assist Stand to Sit: Patient Percentage: 50% Details for Transfer Assistance: cues for hand placement with transfers, ant weight shifting to assist with  standing and for upright posture once standing.       Balance Balance Balance Assessed: Yes Static Standing Balance Static Standing - Balance Support: Bilateral upper extremity supported;During functional activity Static Standing - Level of Assistance: 3: Mod assist   End of Session OT - End of Session Equipment Utilized During Treatment: Gait belt;Rolling walker Activity Tolerance: Patient limited by pain Patient left: in chair;with call bell/phone within reach  GO     Roselie Awkward Dixon 12/29/2012, 10:18 AM

## 2012-12-29 NOTE — Progress Notes (Signed)
Orthopaedic Trauma Service Progress Note  Subjective  Doing ok this am Worked with therapy yesterday and today Making improvements but slowly   C/o R hip/pelvis being sore    Objective   BP 114/57  Pulse 72  Temp(Src) 97.2 F (36.2 C) (Oral)  Resp 18  Ht 5\' 6"  (1.676 m)  Wt 73.7 kg (162 lb 7.7 oz)  BMI 26.24 kg/m2  SpO2 99%  Intake/Output     10/27 0701 - 10/28 0700 10/28 0701 - 10/29 0700   P.O. 800 240   I.V. (mL/kg)     IV Piggyback     Total Intake(mL/kg) 800 (10.9) 240 (3.3)   Urine (mL/kg/hr) 875 (0.5)    Total Output 875     Net -75 +240        Urine Occurrence 3 x      Labs  Results for GUELDA, BATSON (MRN [id number]) as of 12/29/2012 10:19  Ref. Range 12/28/2012 13:51 12/28/2012 16:29 12/28/2012 22:07 12/29/2012 05:25 12/29/2012 05:52  Vit D, 25-Hydroxy Latest Range: 30-89 ng/mL 12 (L)       Results for SHERRITA, RIEDERER (MRN [id number]) as of 12/29/2012 10:19  Ref. Range 12/28/2012 13:51 12/28/2012 16:29 12/28/2012 22:07 12/29/2012 05:25 12/29/2012 05:52  Albumin Latest Range: 3.5-5.2 g/dL 3.0 (L)      Results for JALAYIA, BAGHERI (MRN [id number]) as of 12/29/2012 10:19  Ref. Range 12/28/2012 13:51  Calcium Latest Range: 8.4-10.5 mg/dL 8.4  Corrected calcium = 9.2  Results for DALEYSSA, LOISELLE (MRN [id number]) as of 12/29/2012 10:19  Ref. Range 12/29/2012 05:25  Sodium Latest Range: 135-145 mEq/L 135  Potassium Latest Range: 3.5-5.1 mEq/L 4.1  Chloride Latest Range: 96-112 mEq/L 99  CO2 Latest Range: 19-32 mEq/L 22  BUN Latest Range: 6-23 mg/dL 17  Creatinine Latest Range: 0.50-1.10 mg/dL 5.36  Calcium Latest Range: 8.4-10.5 mg/dL 8.5  GFR calc non Af Amer Latest Range: >90 mL/min 78 (L)  GFR calc Af Amer Latest Range: >90 mL/min >90  Glucose Latest Range: 70-99 mg/dL 644 (H)    Exam  Gen: sitting in bedside chair, watching TV, appears comfortable  Pelvis/ext:  No change in exam from yesterday   Motor and sensory functions intact  + DP pulse  No swelling     Assessment and Plan   POD/HD#: 2   1. R LC 1 pelvic ring fx/R pubic rami insufficiency fx             WBAT             PT/OT              Ice prn             Pain management- limit narcs            2. Metabolic bone disease/ vitamin d deficiency   Vitamin D2 50,000 IU weekly x 8 weeks, then repeat  Vitamin d3 1000 IU BID  Calcium citrate 600 mg po bid  Vitamin C 500 mg po daily    Pt with hx of multiple fxs including ankle, rib, T12 cmpression fx- even w/o frax score she should be on pharmacologic osteoporosis agent    PTH pending   FRAX score  =    10 year probability of     Major osteoporotic fx = 28%    Hip fracture = 13%   Will discuss agents as outpt   3. dipso  Per IM  Ok to go to SNF when bed available for ortho standpoint  Mearl Latin, PA-C [facility] [phone] (P) 12/29/2012 10:19 AM

## 2012-12-29 NOTE — Progress Notes (Signed)
Physical Therapy Treatment Patient Details Name: Alicia Kent MRN: [id number] DOB: 06-28-1930 Today's Date: 12/29/2012 Time: 4098-1191 PT Time Calculation (min): 25 min  PT Assessment / Plan / Recommendation  History of Present Illness Pt admit with R LC 1 pelvic ring fx/R pubic rami insufficiency fx.      PT Comments   Pt making slow progress with mobility. Did complete increased there-ex today with little to no assistance.   Follow Up Recommendations  SNF;Supervision/Assistance - 24 hour     Equipment Recommendations  Other (comment) (TBA)    Frequency Min 5X/week   Progress towards PT Goals Progress towards PT goals: Progressing toward goals (slowly, may need to change frequency if no progess over next few sessions)  Plan Current plan remains appropriate    Precautions / Restrictions Precautions Precautions: Fall;Other (comment) (DNR) Restrictions Weight Bearing Restrictions: Yes RLE Weight Bearing: Weight bearing as tolerated       Mobility  Bed Mobility Bed Mobility: Not assessed Details for Bed Mobility Assistance: pt out of bed in chair Transfers Sit to Stand: 2: Max assist;From chair/3-in-1;With armrests;With upper extremity assist Sit to Stand: Patient Percentage: 50% Stand to Sit: 2: Max assist;To chair/3-in-1;With upper extremity assist;With armrests Stand to Sit: Patient Percentage: 50% Stand Pivot Transfers: Not tested (comment) Details for Transfer Assistance: cues for hand placement with transfers, ant weight shifting to assist with standing and for upright posture once standing.  Ambulation/Gait Ambulation/Gait Assistance: 2: Max assist Ambulation Distance (Feet): 2 Feet Assistive device: Rolling walker Ambulation/Gait Assistance Details: max assist with max cues for one step forward and then one step backward. right leg buckling/giving out with weight bearing. Gait Pattern: Step-to pattern;Antalgic;Narrow base of support;Trunk flexed;Right flexed knee in  stance;Decreased step length - right;Decreased stance time - right    Exercises General Exercises - Lower Extremity Ankle Circles/Pumps: AROM;Both;10 reps;Seated Quad Sets: AROM;Strengthening;Both;10 reps;Seated Long Arc Quad: AROM;Strengthening;Both;10 reps;Seated Heel Slides: AAROM;Strengthening;AROM;Right;Left;Both;10 reps;Seated Hip ABduction/ADduction: AAROM;AROM;Strengthening;Both;10 reps;Seated Toe Raises: AROM;Strengthening;Both;Seated;10 reps Heel Raises: AROM;Strengthening;10 reps;Both;Seated      PT Goals (current goals can now be found in the care plan section) Acute Rehab PT Goals Patient Stated Goal: to get better PT Goal Formulation: With patient Time For Goal Achievement: 01/11/13 Potential to Achieve Goals: Good  Visit Information  Last PT Received On: 12/29/12 Assistance Needed: +2 History of Present Illness: Pt admit with R LC 1 pelvic ring fx/R pubic rami insufficiency fx.       Subjective Data  Patient Stated Goal: to get better   Cognition  Cognition Arousal/Alertness: Awake/alert Behavior During Therapy: WFL for tasks assessed/performed Overall Cognitive Status: History of cognitive impairments - at baseline    End of Session PT - End of Session Equipment Utilized During Treatment: Gait belt;Oxygen Activity Tolerance: Patient limited by fatigue;Patient limited by pain Patient left: in chair;with call bell/phone within reach Nurse Communication: Mobility status   GP     Sallyanne Kuster 12/29/2012, 10:10 AM  Sallyanne Kuster, PTA Office- [phone]

## 2012-12-30 ENCOUNTER — Other Ambulatory Visit: Payer: Self-pay | Admitting: *Deleted

## 2012-12-30 DIAGNOSIS — J441 Chronic obstructive pulmonary disease with (acute) exacerbation: Secondary | ICD-10-CM

## 2012-12-30 DIAGNOSIS — D72829 Elevated white blood cell count, unspecified: Secondary | ICD-10-CM

## 2012-12-30 DIAGNOSIS — J9600 Acute respiratory failure, unspecified whether with hypoxia or hypercapnia: Secondary | ICD-10-CM

## 2012-12-30 DIAGNOSIS — J189 Pneumonia, unspecified organism: Secondary | ICD-10-CM

## 2012-12-30 LAB — GLUCOSE, CAPILLARY: Glucose-Capillary: 176 mg/dL — ABNORMAL HIGH (ref 70–99)

## 2012-12-30 LAB — PTH, INTACT AND CALCIUM: PTH: 106.4 pg/mL — ABNORMAL HIGH (ref 14.0–72.0)

## 2012-12-30 NOTE — Progress Notes (Signed)
Physical Therapy Treatment Patient Details Name: Alicia Kent MRN: [id number] DOB: 1930-05-09 Today's Date: 12/30/2012 Time: 6295-2841 PT Time Calculation (min): 25 min  PT Assessment / Plan / Recommendation  History of Present Illness Pt admit with R LC 1 pelvic ring fx/R pubic rami insufficiency fx.      PT Comments   Pt able to participate in therapy at mod A level today, limited by pain. Pt unable to gait but able to stand and wt shift multiple times.  Pt's daughter present for therapy who states her mother needs to be mod I to go home, current plan is SNF.  Follow Up Recommendations  SNF     Does the patient have the potential to tolerate intense rehabilitation     Barriers to Discharge        Equipment Recommendations       Recommendations for Other Services    Frequency Min 3X/week   Progress towards PT Goals Progress towards PT goals: Progressing toward goals  Plan Frequency needs to be updated    Precautions / Restrictions Precautions Precautions: Fall Restrictions RLE Weight Bearing: Weight bearing as tolerated   Pertinent Vitals/Pain 7/10 pain in pelvis with wt bearing, eases with rest    Mobility  Bed Mobility Bed Mobility: Sit to Supine Supine to Sit: 3: Mod assist;HOB elevated Sit to Supine: 2: Max assist Details for Bed Mobility Assistance: Pt requires A for B LEs for bed mobiltiy, min A for trunk for supine to sit Transfers Sit to Stand: 3: Mod assist;With upper extremity assist Stand to Sit: 3: Mod assist;With upper extremity assist Details for Transfer Assistance: cues foro hand placement and manual facilitiation for forward wt shift.  Pt able to assist with sit to stand using B LEs Ambulation/Gait Ambulation/Gait Assistance Details: pre gait stepping with R LE (unable to perform on L due to pain), heel raises alternating to promote wt shifts.  Pt able to stand and perform pre-gait activities 1-2 minutes at a time before requiring rest    Exercises      PT Diagnosis:    PT Problem List:   PT Treatment Interventions:     PT Goals (current goals can now be found in the care plan section)    Visit Information  Last PT Received On: 12/30/12 Assistance Needed: +1 History of Present Illness: Pt admit with R LC 1 pelvic ring fx/R pubic rami insufficiency fx.       Subjective Data      Cognition  Cognition Behavior During Therapy: WFL for tasks assessed/performed Overall Cognitive Status: History of cognitive impairments - at baseline    Balance  Static Standing Balance Static Standing - Balance Support: Bilateral upper extremity supported;During functional activity Static Standing - Level of Assistance: 3: Mod assist  End of Session PT - End of Session Equipment Utilized During Treatment: Oxygen;Gait belt Activity Tolerance: Patient limited by fatigue;Patient limited by pain Patient left: in chair;with call bell/phone within reach;with family/visitor present   GP     Ari Engelbrecht 12/30/2012, 9:55 AM

## 2012-12-30 NOTE — Progress Notes (Signed)
[facility] PROGRESS NOTE  Assessment/Plan: Pelvic fracture:  - closed and secondary to mechanical fall. Per orthopedic service no surgical intervention required.  -WAT  -PT/OT  -PRN analgesics  -Vit D supplementation  -will benefit of dexa scan in outpatient setting and most likely agents for osteoporosis.  bronchitis/early PNA/COPD with exacerbation::  - patient without fever, but with new cough, elevated WBC's and tachypnea.  - continue doxycycline as instructed to finish abx's therapy  - continue supplemental oxygen (chronically on it)  - PRN nebulizers  -Duo Nebs, -pulmicort  -abx's and prednisone  -patient chronically on oxygen  -good O 2 sat at home requirements.  Acute on chronic resp failure:  - due to mild exacerbation of COPD and bronchitis/PNA  - will treat as mentioned above with abx's, nebulizer tx, oxygen supplementation and will also use prednisone.  - significant improvement in SOB and RR  - pulmicort added long term for maintenance   DIABETES MELLITUS, TYPE II:  - Continue lantus and carb modified diet.  - A1C 6.0   HYPERLIPIDEMIA:  - continue statins.  DEMENTIA:  - continue supportive regimena nd home meds.  DEPRESSION:  - stable. No Suicidal Ideation..  - continue celexa   HYPERTENSION:  - fair controlled. Pain contributing to BP elevation.  - improved control now that pain is under better control  - Will continue home meds.  - follow low sodium diet     Consultants:  none  Procedures:  non  Antibiotics:  Doxy  HPI/Subjective: No complains.  Objective: Filed Vitals:   12/29/12 2345 12/30/12 0505 12/30/12 0949 12/30/12 1031  BP: 145/60 146/51  153/65  Pulse: 77 70    Temp:  97.7 F (36.5 C)    TempSrc:  Oral    Resp:  17    Height:      Weight:  73 kg (160 lb 15 oz)    SpO2:  98% 98%     Intake/Output Summary (Last 24 hours) at 12/30/12 1048 Last data filed at 12/30/12 0900  Gross per 24 hour  Intake    560 ml   Output    525 ml  Net     35 ml   Filed Weights   12/28/12 0503 12/29/12 0541 12/30/12 0505  Weight: 74.208 kg (163 lb 9.6 oz) 73.7 kg (162 lb 7.7 oz) 73 kg (160 lb 15 oz)    Exam:  General: Alert, awake, oriented x1, in no acute distress.  HEENT: No bruits, no goiter.  Heart: Regular rate and rhythm, without murmurs, rubs, gallops.  Lungs: Good air movement, bilateral air movement.  Abdomen: Soft, nontender, nondistended, positive bowel sounds.     Data Reviewed: Basic Metabolic Panel:  Recent Labs Lab 12/27/12 0500 12/28/12 1351 12/29/12 0525  NA 138 133* 135  K 3.4* 3.8 4.1  CL 102 98 99  CO2 21 21 22   GLUCOSE 181* 236* 230*  BUN 8 10 17   CREATININE 0.66 0.81 0.74  CALCIUM 8.7 8.4 8.5  PHOS  --  2.0*  --    Liver Function Tests:  Recent Labs Lab 12/28/12 1351  ALBUMIN 3.0*   No results found for this basename: LIPASE, AMYLASE,  in the last 168 hours No results found for this basename: AMMONIA,  in the last 168 hours CBC:  Recent Labs Lab 12/27/12 0655 12/29/12 0525  WBC 14.8* 9.3  NEUTROABS 12.3*  --   HGB 12.3 10.9*  HCT 36.4 32.5*  MCV 84.7 86.0  PLT 401* 349  Cardiac Enzymes:  Recent Labs Lab 12/27/12 0500  TROPONINI <0.30   BNP (last 3 results)  Recent Labs  05/23/12 2000  PROBNP 719.0*   CBG:  Recent Labs Lab 12/29/12 0552 12/29/12 1125 12/29/12 1707 12/29/12 2123 12/30/12 0704  GLUCAP 209* 202* 167* 176* 176*    No results found for this or any previous visit (from the past 240 hour(s)).   Studies: No results found.  Scheduled Meds: . aspirin EC  81 mg Oral Daily  . budesonide (PULMICORT) nebulizer solution  0.25 mg Nebulization BID  . cefTRIAXone (ROCEPHIN)  IV  1 g Intravenous Q24H  . citalopram  20 mg Oral q morning - 10a  . docusate sodium  50 mg Oral BID  . donepezil  10 mg Oral QHS  . doxycycline (VIBRAMYCIN) IV  100 mg Intravenous Q12H  . famotidine  40 mg Oral q morning - 10a  . heparin  5,000 Units  Subcutaneous Q8H  . insulin aspart  0-15 Units Subcutaneous TID WC  . insulin glargine  25 Units Subcutaneous QHS  . labetalol  200 mg Oral BID  . loratadine  10 mg Oral Daily  . montelukast  10 mg Oral QHS  . NIFEdipine  30 mg Oral q morning - 10a  . pantoprazole  40 mg Oral Q1200  . polyethylene glycol  17 g Oral Daily  . predniSONE  40 mg Oral BID WC  . risperiDONE  1 mg Oral BID   Continuous Infusions:    Marinda Elk  [facility] Pager [phone]. If 8PM-8AM, please contact night-coverage at [email or web address], password [facility] 12/30/2012, 10:48 AM  LOS: 3 days

## 2012-12-31 ENCOUNTER — Other Ambulatory Visit: Payer: Self-pay

## 2012-12-31 MED ORDER — HYDROCODONE-ACETAMINOPHEN 5-325 MG PO TABS: ORAL_TABLET | ORAL | Status: DC

## 2013-01-01 LAB — VITAMIN D 1,25 DIHYDROXY
Vitamin D2 1, 25 (OH)2: <8 pg/mL
Vitamin D3 1, 25 (OH)2: 66 pg/mL

## 2013-01-04 ENCOUNTER — Non-Acute Institutional Stay (SKILLED_NURSING_FACILITY): Payer: Medicare Other | Admitting: Internal Medicine

## 2013-01-04 ENCOUNTER — Encounter: Payer: Self-pay | Admitting: Internal Medicine

## 2013-01-04 DIAGNOSIS — J441 Chronic obstructive pulmonary disease with (acute) exacerbation: Secondary | ICD-10-CM

## 2013-01-04 DIAGNOSIS — R531 Weakness: Secondary | ICD-10-CM

## 2013-01-04 DIAGNOSIS — IMO0001 Reserved for inherently not codable concepts without codable children: Secondary | ICD-10-CM

## 2013-01-04 DIAGNOSIS — F29 Unspecified psychosis not due to a substance or known physiological condition: Secondary | ICD-10-CM | POA: Insufficient documentation

## 2013-01-04 DIAGNOSIS — S329XXD Fracture of unspecified parts of lumbosacral spine and pelvis, subsequent encounter for fracture with routine healing: Secondary | ICD-10-CM

## 2013-01-04 DIAGNOSIS — F0150 Vascular dementia without behavioral disturbance: Secondary | ICD-10-CM | POA: Insufficient documentation

## 2013-01-04 DIAGNOSIS — I10 Essential (primary) hypertension: Secondary | ICD-10-CM

## 2013-01-04 DIAGNOSIS — R5381 Other malaise: Secondary | ICD-10-CM

## 2013-01-04 NOTE — Progress Notes (Signed)
Patient ID: Alicia Kent, female   DOB: [birth year], 79 y.o.   MRN: [id number]  [facility]   PCP: Oliver Barre, MD  Code Status:dnr  Allergies  Allergen Reactions  . Ibuprofen Anaphylaxis  . Shellfish-Derived Products Anaphylaxis    Swelling tongue and throat  . Ace Inhibitors Other (See Comments)    unknown  . Sulfa Antibiotics Nausea And Vomiting    Chief Complaint: new admit  HPI:  79 y/o female patient is here for STR post hospital admission after a mechanical fall. She had non displace pubic rami fracture. She has history of hypertension, type 2 dm, GERD, copd on o2, dementia and depression. Orthopedic was consulted and medical management was decided on, she is WBAT and was put on analgesics. PT/OT was recommended. She also had leukocytosis and tachypnea and was treated with doxycycline for bronchitis and has completed her treatment. She was seen in her room today. She is diaphoretic but denies any complaints. She is alert and oriented and in no distress. Her vitals are stable. Her daughter is present in her room and mentions pt complaining of pain after finishing therapy and being tired. She has also only drank coffee since am. Checked her cbg and is 105. She is also covered in 3 layers of blanket  Review of Systems:  On continuous o2 at 1l / min with o2 sat between 88-92 Denies chest pain or dyspnea Denies nausea or vomiting Appetite is fair No abdominal pain No urinary or bowel complaint Denies dizziness or presyncope No falls reported No new skin concern Taking her medications  Past Medical History  Diagnosis Date  . Diabetes mellitus type II   . Hyperlipidemia   . HTN (hypertension)   . GERD (gastroesophageal reflux disease)   . Allergic rhinitis   . Depression   . Dementia     mild  . History of CVA (cerebrovascular accident)   . LBBB (left bundle branch block)   . AAA (abdominal aortic aneurysm)     small, last check in 05/2009, due for repeat in  05/2010  . Carotid stenosis 09/2009    40-59% RICA, 0-39% LICA  . LVH (left ventricular hypertrophy) 10/2009    Moderate, EF 55-60%, no regional wall motion abnormalities  . Shellfish allergy     anaphylaxis  . Osteopenia   . Vitamin D deficiency   . Fracture of rib of left side 12/2009  . Asthma 01/27/2011  . Diastolic CHF 08/02/2011   Past Surgical History  Procedure Laterality Date  . Ankle surgery      Right, by Ortho in [place]  . Tracheostomy  2010    anaphylactic episode on vent  . Tubal ligation     Social History:   reports that she has been smoking.  She does not have any smokeless tobacco history on file. She reports that she does not drink alcohol or use illicit drugs.  Family History  Problem Relation Age of Onset  . Rheum arthritis Sister   . Stroke Brother   . Hypertension Brother   . Diabetes type II Other     2 siblings    Medications: Patient's Medications  New Prescriptions   No medications on file  Previous Medications   ASPIRIN 81 MG EC TABLET    Take 81 mg by mouth every morning.    BUDESONIDE (PULMICORT) 0.25 MG/2ML NEBULIZER SOLUTION    Take 2 mLs (0.25 mg total) by nebulization 2 (two) times daily.   CALCIUM-VITAMIN D (  OSCAL 500/200 D-3) 500-200 MG-UNIT PER TABLET    Take 2 tablets by mouth daily with breakfast. To start after finishing high dose 8 weeks treatment with Vit D 2   CALCIUM-VITAMIN D 250-100 MG-UNIT PER TABLET    Take 2 tablets by mouth 2 (two) times daily.   CITALOPRAM (CELEXA) 20 MG TABLET    Take 20 mg by mouth every morning.   DOCUSATE SODIUM (COLACE) 50 MG CAPSULE    Take 2 capsules (100 mg total) by mouth 2 (two) times daily.   DONEPEZIL (ARICEPT) 10 MG TABLET    Take 10 mg by mouth daily.    ERGOCALCIFEROL (VITAMIN D2) 50000 UNITS CAPSULE    Take 1 capsule (50,000 Units total) by mouth once a week.   FAMOTIDINE (PEPCID) 40 MG TABLET    Take 40 mg by mouth every morning.   FEXOFENADINE (ALLEGRA) 180 MG TABLET    Take 1 tablet (180  mg total) by mouth daily.   HYDROCODONE-ACETAMINOPHEN (NORCO/VICODIN) 5-325 MG PER TABLET    1 by mouth every 6 hours as needed for mild to moderate pain **DO NOT EXCEED 4GM OF TYLENOL IN 24 HOURS, take 2 by mouth every 6 hours as needed for moderate to severe pain.   INSULIN GLARGINE (LANTUS) 100 UNIT/ML INJECTION    Inject 40 Units into the skin at bedtime.    IPRATROPIUM-ALBUTEROL (DUONEB) 0.5-2.5 (3) MG/3ML SOLN    Take 3 mLs by nebulization every 6 (six) hours as needed.   LABETALOL (NORMODYNE) 200 MG TABLET    Take 200 mg by mouth 2 (two) times daily.   MONTELUKAST (SINGULAIR) 10 MG TABLET    Take 10 mg by mouth at bedtime.   NIFEDIPINE (PROCARDIA-XL/ADALAT-CC/NIFEDICAL-XL) 30 MG 24 HR TABLET    Take 30 mg by mouth every morning.   PANTOPRAZOLE (PROTONIX) 40 MG TABLET    Take 1 tablet (40 mg total) by mouth daily at 12 noon.   POLYETHYLENE GLYCOL (MIRALAX / GLYCOLAX) PACKET    Take 17 g by mouth daily.   PREDNISONE (DELTASONE) 20 MG TABLET    Take 3 tabs by mouth daily X 1 day, then 2 tabs by mouth daily X 2 days; then 1 tab by mouth daily X 2 days; then 1/2 tablet by mouth daily X 2 days and then stop prednisone   RISPERIDONE (RISPERDAL) 1 MG TABLET    Take 1 mg by mouth 2 (two) times daily.    VITAMIN C (ASCORBIC ACID) 500 MG TABLET    Take 1 tablet (500 mg total) by mouth daily.  Modified Medications   No medications on file  Discontinued Medications   No medications on file     Physical Exam:  Filed Vitals:   01/04/13 1501  BP: 142/68  Pulse: 88  Temp: 97.6 F (36.4 C)  Resp: 19  SpO2: 90%   General- elderly female in no acute distress, sweat beads on forehead and palms sweating Head- atraumatic, normocephalic Eyes- PERRLA, EOMI, no pallor, no icterus, no discharge Neck- no lymphadenopathy, no thyromegaly, no jugular vein distension, no carotid bruit Chest- no chest wall deformities, no chest wall tenderness Cardiovascular- normal s1,s2, no murmurs/ rubs/  gallops Respiratory- bilateral decreased air entry, no wheeze or crackles, few basilar rhonchi Abdomen- bowel sounds present, soft, non tender, no organomegaly, no abdominal bruits, no guarding or rigidity, no CVA tenderness Musculoskeletal- able to move all 4 extremities, weakness noted Neurological- no focal deficit Psychiatry- alert and oriented to person, place and time,  normal mood and affect   Labs reviewed: Basic Metabolic Panel:  Recent Labs  16/10/96 0500 12/28/12 1351 12/29/12 0525  NA 138 133* 135  K 3.4* 3.8 4.1  CL 102 98 99  CO2 21 21 22   GLUCOSE 181* 236* 230*  BUN 8 10 17   CREATININE 0.66 0.81 0.74  CALCIUM 8.7 8.4 8.5  8.5  PHOS  --  2.0*  --    Liver Function Tests:  Recent Labs  12/28/12 1351  ALBUMIN 3.0*   CBC:  Recent Labs  05/23/12 2000 05/24/12 0505 12/27/12 0655 12/29/12 0525  WBC 16.9* 13.1* 14.8* 9.3  NEUTROABS 14.1*  --  12.3*  --   HGB 12.9 11.5* 12.3 10.9*  HCT 38.6 34.7* 36.4 32.5*  MCV 87.1 87.4 84.7 86.0  PLT 346 323 401* 349   Cardiac Enzymes:  Recent Labs  05/24/12 0505 05/24/12 1145 12/27/12 0500  TROPONINI <0.30 <0.30 <0.30   BNP: No components found with this basename: POCBNP,  CBG:  Recent Labs  12/29/12 2123 12/30/12 0704 12/30/12 1130  GLUCAP 176* 176* 179*    Assessment/Plan  Generalized weakness- in setting of recent fall with fracture and her copd exacerbation with bronchitis. fall precautions, to work with therapy team for gait training and muscle exercises. Encourage po intake with strict aspiration precautions  Pubic rami fracture- to work with therapy team PT/OT. Continue hydrocodone 5/325 1-2 tab q6h prn for pain due to pubic rami fracture. Skin care, pressure ulcer and skin tear prevention. Will also have SLP assess her for aspiration risk prevention. Fall precautions. WBAT and continue ca-vit d supplement  Psychosis- continue risperdial 1 mg bid to help with her mood in setting of dementia  and continue her celexa 20 mg daily  Vascular Dementia- in setting of cva, dm type 2. continue aricept 10 mg daily  gerd- continue pepcid and protonix to help control her symptoms  Dm type 2- continue lantus 40 u daily, monitor cbg  Hypertension- bp well controlled. Continue labetalol 200 mg bid, procardia 30 mg daily and aspirin  cva- bp well controlled. Continue aspirin. Not on statin  Copd-  On tapering course of prednisone and has completed vibramycin 100 mg bid.given her rhonchi on exam and concerns for aspiration, will get repeat cxr to r/o pneumonia. Monitor clinically. Will also have cbc with diff, pending. Continue bronchodilator. Will increase o2 supply to 2l/min for now  Family/ staff Communication: reviewed care plan with patient , daughter and nursing supervisor   Labs/tests ordered- cbc, cmp

## 2013-01-11 NOTE — Progress Notes (Signed)
Please see other full dictation and my appended note. I have reviewed and discussed in detail with [name] the patient's presentation, examination findings, and I formulated the plan outlined above.  Budd Palmer, MD

## 2013-02-06 ENCOUNTER — Other Ambulatory Visit: Payer: Self-pay | Admitting: Internal Medicine

## 2013-02-09 NOTE — Telephone Encounter (Signed)
Done erx 

## 2013-02-12 ENCOUNTER — Telehealth: Payer: Self-pay | Admitting: *Deleted

## 2013-02-12 NOTE — Telephone Encounter (Signed)
Called [facility] informed of ok

## 2013-02-12 NOTE — Telephone Encounter (Signed)
Alicia Kent from [facility] called requesting VO for [facility] once a week for 4 weeks for wound care.  Please advise

## 2013-02-12 NOTE — Telephone Encounter (Signed)
Ok for verbal 

## 2013-02-16 ENCOUNTER — Ambulatory Visit (INDEPENDENT_AMBULATORY_CARE_PROVIDER_SITE_OTHER): Payer: Medicare Other | Admitting: Internal Medicine

## 2013-02-16 ENCOUNTER — Encounter: Payer: Self-pay | Admitting: Internal Medicine

## 2013-02-16 VITALS — BP 142/70 | HR 71 | Temp 97.1°F | Wt 158.2 lb

## 2013-02-16 DIAGNOSIS — R350 Frequency of micturition: Secondary | ICD-10-CM | POA: Insufficient documentation

## 2013-02-16 DIAGNOSIS — J449 Chronic obstructive pulmonary disease, unspecified: Secondary | ICD-10-CM

## 2013-02-16 DIAGNOSIS — I10 Essential (primary) hypertension: Secondary | ICD-10-CM

## 2013-02-16 DIAGNOSIS — F068 Other specified mental disorders due to known physiological condition: Secondary | ICD-10-CM

## 2013-02-16 DIAGNOSIS — E785 Hyperlipidemia, unspecified: Secondary | ICD-10-CM

## 2013-02-16 DIAGNOSIS — J4489 Other specified chronic obstructive pulmonary disease: Secondary | ICD-10-CM

## 2013-02-16 DIAGNOSIS — E119 Type 2 diabetes mellitus without complications: Secondary | ICD-10-CM

## 2013-02-16 MED ORDER — GUAIFENESIN 400 MG PO TABS: 400.0000 mg | ORAL_TABLET | Freq: Two times a day (BID) | ORAL | Status: DC | PRN

## 2013-02-16 MED ORDER — RISPERIDONE 1 MG PO TABS: 1.0000 mg | ORAL_TABLET | Freq: Two times a day (BID) | ORAL | Status: DC

## 2013-02-16 NOTE — Assessment & Plan Note (Signed)
stable overall by history and exam, recent data reviewed with pt, and pt to continue medical treatment as before,  to f/u any worsening symptoms or concerns Lab Results  Component Value Date   LDLCALC 62 11/05/2011

## 2013-02-16 NOTE — Progress Notes (Signed)
Subjective:    Patient ID: Alicia Kent, female    DOB: [birth year], 79 y.o.   MRN: [id number]  HPI  Here to f/u, has some urianry freq with mild incontinence worse in the past wk, but Denies urinary symptoms such as dysuria, frequency, urgency, flank pain, hematuria or n/v, fever, chills.  Dementia overall stable symptomatically with gradual worsening at best, and not assoc with behavioral changes such as hallucinations, paranoia, or agitation.  Last hospd oct 2014 with recommendation for f/u labs.  Having AM congestion per family, hard to mobilize AM secretions with cough, wheeze, tachpnea in the am only when first wakes up Past Medical History  Diagnosis Date  . Diabetes mellitus type II   . Hyperlipidemia   . HTN (hypertension)   . GERD (gastroesophageal reflux disease)   . Allergic rhinitis   . Depression   . Dementia     mild  . History of CVA (cerebrovascular accident)   . LBBB (left bundle branch block)   . AAA (abdominal aortic aneurysm)     small, last check in 05/2009, due for repeat in 05/2010  . Carotid stenosis 09/2009    40-59% RICA, 0-39% LICA  . LVH (left ventricular hypertrophy) 10/2009    Moderate, EF 55-60%, no regional wall motion abnormalities  . Shellfish allergy     anaphylaxis  . Osteopenia   . Vitamin D deficiency   . Fracture of rib of left side 12/2009  . Asthma 01/27/2011  . Diastolic CHF 08/02/2011   Past Surgical History  Procedure Laterality Date  . Ankle surgery      Right, by Ortho in [place]  . Tracheostomy  2010    anaphylactic episode on vent  . Tubal ligation      reports that she has been smoking.  She does not have any smokeless tobacco history on file. She reports that she does not drink alcohol or use illicit drugs. family history includes Diabetes type II in her other; Hypertension in her brother; Rheum arthritis in her sister; Stroke in her brother. Allergies  Allergen Reactions  . Ibuprofen Anaphylaxis  . Shellfish-Derived Products  Anaphylaxis    Swelling tongue and throat  . Ace Inhibitors Other (See Comments)    unknown  . Sulfa Antibiotics Nausea And Vomiting   Current Outpatient Prescriptions on File Prior to Visit  Medication Sig Dispense Refill  . aspirin 81 MG EC tablet Take 81 mg by mouth every morning.       . citalopram (CELEXA) 20 MG tablet Take 20 mg by mouth every morning.      . famotidine (PEPCID) 40 MG tablet Take 40 mg by mouth every morning.      Marland Kitchen HYDROcodone-acetaminophen (NORCO/VICODIN) 5-325 MG per tablet 1 by mouth every 6 hours as needed for mild to moderate pain **DO NOT EXCEED 4GM OF TYLENOL IN 24 HOURS, take 2 by mouth every 6 hours as needed for moderate to severe pain.  120 tablet  0  . insulin glargine (LANTUS) 100 UNIT/ML injection Inject 25 Units into the skin at bedtime.       Marland Kitchen ipratropium-albuterol (DUONEB) 0.5-2.5 (3) MG/3ML SOLN Take 3 mLs by nebulization every 6 (six) hours as needed.  360 mL  11  . labetalol (NORMODYNE) 200 MG tablet Take 200 mg by mouth 2 (two) times daily.      . montelukast (SINGULAIR) 10 MG tablet Take 10 mg by mouth at bedtime.      Marland Kitchen NIFEdipine (PROCARDIA-XL/ADALAT-CC/NIFEDICAL-XL) 30  MG 24 hr tablet Take 30 mg by mouth every morning.       No current facility-administered medications on file prior to visit.   Review of Systems  Constitutional: Negative for unexpected weight change, or unusual diaphoresis  HENT: Negative for tinnitus.   Eyes: Negative for photophobia and visual disturbance.  Respiratory: Negative for choking and stridor.   Gastrointestinal: Negative for vomiting and blood in stool.  Genitourinary: Negative for hematuria and decreased urine volume.  Musculoskeletal: Negative for acute joint swelling Skin: Negative for color change and wound.  Neurological: Negative for tremors and numbness other than noted  Psychiatric/Behavioral: Negative for decreased concentration or  hyperactivity.       Objective:   Physical Exam BP 142/70   Pulse 71  Temp(Src) 97.1 F (36.2 C) (Oral)  Wt 158 lb 4 oz (71.782 kg)  SpO2 98% VS noted,  Constitutional: Pt appears well-developed and well-nourished.  HENT: Head: NCAT.  Right Ear: External ear normal.  Left Ear: External ear normal.  Eyes: Conjunctivae and EOM are normal. Pupils are equal, round, and reactive to light.  Neck: Normal range of motion. Neck supple.  Cardiovascular: Normal rate and regular rhythm.   Pulmonary/Chest: Effort normal and breath sounds normal.  Abd:  Soft, NT, non-distended, + BS, no flank tender Neurological: Pt is alert. Motor 5/5 Skin: Skin is warm. No erythema.  Psychiatric: Pt behavior is normal. Thought content c/w dementia severe    Assessment & Plan:

## 2013-02-16 NOTE — Patient Instructions (Signed)
Please continue all other medications as before, and refills have been done if requested - the risperdal Please take all new medication as prescribed - the generic for mucinex tablets Please go to the LAB in the Basement (turn left off the elevator) for the tests to be done today You will be contacted by phone if any changes need to be made immediately.  Otherwise, you will receive a letter about your results with an explanation, but please check with MyChart first.  You also have the prescription for Gentiva to order the Urine testing

## 2013-02-16 NOTE — Assessment & Plan Note (Signed)
stable overall by history and exam, recent data reviewed with pt, and pt to continue medical treatment as before,  to f/u any worsening symptoms or concerns BP Readings from Last 3 Encounters:  02/16/13 142/70  01/04/13 142/68  12/30/12 145/60

## 2013-02-16 NOTE — Progress Notes (Signed)
Pre-visit discussion using our clinic review tool. No additional management support is needed unless otherwise documented below in the visit note.  

## 2013-02-16 NOTE — Assessment & Plan Note (Addendum)
stable overall by history and exam, recent data reviewed with pt and family, and pt to continue medical treatment as before,  to f/u any worsening symptoms or concerns Lab Results  Component Value Date   WBC 9.3 12/29/2012   HGB 10.9* 12/29/2012   HCT 32.5* 12/29/2012   PLT 349 12/29/2012   GLUCOSE 230* 12/29/2012   CHOL 126 11/05/2011   TRIG 84.0 11/05/2011   HDL 47.00 11/05/2011   LDLCALC 62 11/05/2011   ALT 13 11/05/2011   AST 23 11/05/2011   NA 135 12/29/2012   K 4.1 12/29/2012   CL 99 12/29/2012   CREATININE 0.74 12/29/2012   BUN 17 12/29/2012   CO2 22 12/29/2012   TSH 2.713 12/27/2012   INR 1.02 05/23/2012   HGBA1C 6.0* 12/27/2012   MICROALBUR 2.1* 11/05/2011   Note:  Total time for pt hx, exam, review of record with pt in the room, determination of diagnoses and plan for further eval and tx is > 40 min, with over 50% spent in coordination and counseling of patient

## 2013-02-16 NOTE — Assessment & Plan Note (Signed)
For UA with [facility], ? UTI

## 2013-02-16 NOTE — Assessment & Plan Note (Signed)
Cont current tx, for mucinex 400 bid prn

## 2013-02-17 ENCOUNTER — Telehealth: Payer: Self-pay | Admitting: Internal Medicine

## 2013-02-17 DIAGNOSIS — R350 Frequency of micturition: Secondary | ICD-10-CM

## 2013-02-17 NOTE — Telephone Encounter (Signed)
Alicia Kent called about coming to the house.  They will just bring a cup for urine that would need to be brought here.  Her daughter wants the UA added to the labs to be done on Friday so they can do it all here at the same time.

## 2013-02-17 NOTE — Telephone Encounter (Signed)
Ok, robin to add ua to [facility] labs - [phone]

## 2013-02-17 NOTE — Telephone Encounter (Signed)
Put lab order in. 

## 2013-03-08 ENCOUNTER — Other Ambulatory Visit: Payer: Self-pay | Admitting: Internal Medicine

## 2013-03-08 ENCOUNTER — Telehealth: Payer: Self-pay | Admitting: Internal Medicine

## 2013-03-08 NOTE — Telephone Encounter (Signed)
Pt's daughter called request all of Mrs. Alicia Kent" med to be send into [facility]. Pt has [employer] HMO ID: [id number]. Pt stated that she called last week and left a vm, didn't hear anything from our office. Please advise

## 2013-03-11 IMAGING — CR DG KNEE 1-2V*L*
1 series · 2 of 2 positions shown · non-contrast
Comparison: none

REASON FOR EXAM: left total knee
COMMENTS:

[Series 1: view not recorded · 0.17mm/px · 2 of 2 slices shown]
[im 1/2]
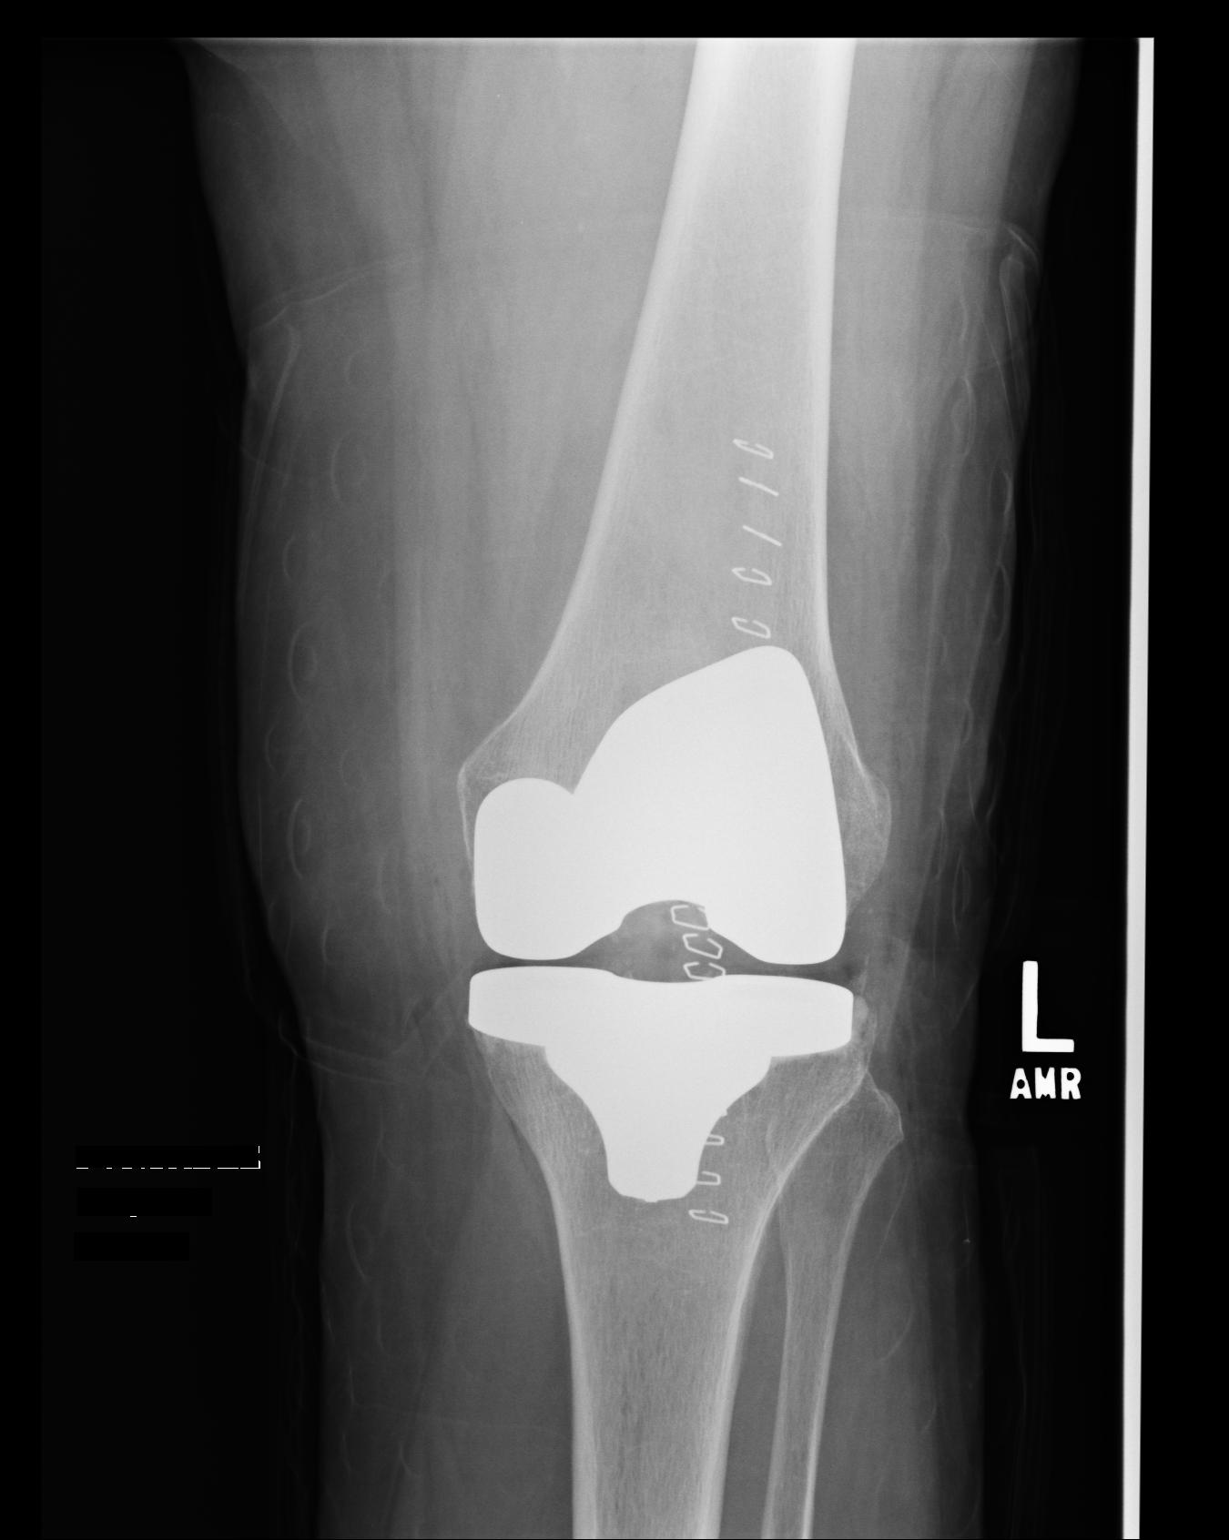
[im 2/2]
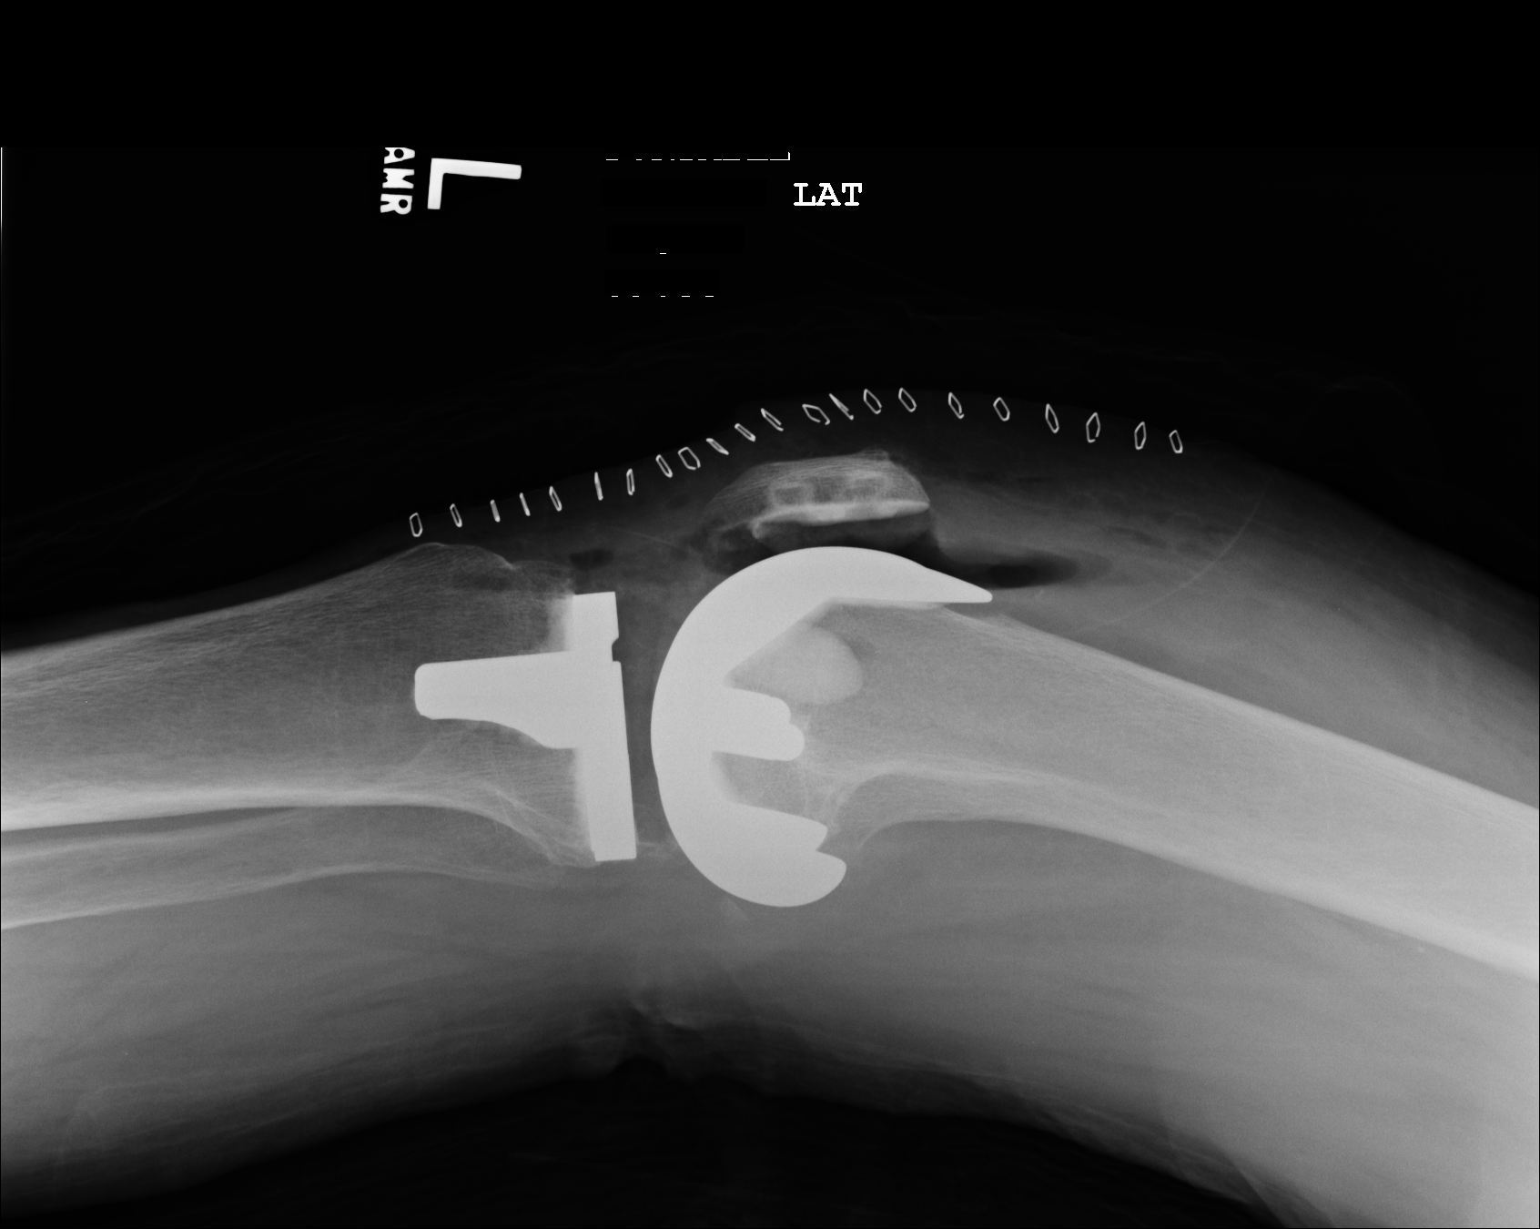

[2 of 2 positions shown; findings below may reference images not displayed]

PROCEDURE:     DXR - DXR KNEE LEFT AP AND LATERAL  - July 19, 2010 [DATE]

RESULT:     The patient is status post left knee arthroplasty with surgical
drains and skin staples present. There is no evidence of immediate
postoperative bone or hardware complication. There is a slight gap between
the posterior superior aspect of the anterior femoral hardware component and
the underlying bony tissue. This is seen on the lateral view.
IMPRESSION: Please see above.

## 2013-03-19 ENCOUNTER — Ambulatory Visit: Payer: Medicare Other | Admitting: Internal Medicine

## 2013-03-25 ENCOUNTER — Telehealth: Payer: Self-pay | Admitting: Internal Medicine

## 2013-03-25 MED ORDER — INSULIN GLARGINE 100 UNIT/ML ~~LOC~~ SOLN: 25.0000 [IU] | Freq: Every day | SUBCUTANEOUS | Status: AC

## 2013-03-25 MED ORDER — LABETALOL HCL 200 MG PO TABS: 200.0000 mg | ORAL_TABLET | Freq: Two times a day (BID) | ORAL | Status: DC

## 2013-03-25 MED ORDER — MONTELUKAST SODIUM 10 MG PO TABS: 10.0000 mg | ORAL_TABLET | Freq: Every day | ORAL | Status: DC

## 2013-03-25 MED ORDER — DONEPEZIL HCL 10 MG PO TABS: 10.0000 mg | ORAL_TABLET | Freq: Every day | ORAL | Status: DC

## 2013-03-25 MED ORDER — NIFEDIPINE ER OSMOTIC RELEASE 30 MG PO TB24: 30.0000 mg | ORAL_TABLET | Freq: Every morning | ORAL | Status: DC

## 2013-03-25 MED ORDER — FAMOTIDINE 40 MG PO TABS: 40.0000 mg | ORAL_TABLET | Freq: Every morning | ORAL | Status: DC

## 2013-03-25 MED ORDER — RISPERIDONE 1 MG PO TABS: 1.0000 mg | ORAL_TABLET | Freq: Two times a day (BID) | ORAL | Status: DC

## 2013-03-25 MED ORDER — CITALOPRAM HYDROBROMIDE 20 MG PO TABS: 20.0000 mg | ORAL_TABLET | Freq: Every morning | ORAL | Status: DC

## 2013-03-25 NOTE — Telephone Encounter (Signed)
Called the patient and sent all prescriptions into their new mail order [facility].

## 2013-03-25 NOTE — Telephone Encounter (Signed)
Pt's daughter called to follow up on the medication request. Please follow up with pt. Pt's daughter stated all of Alicia Kent med need to be send into [facility]. Please see prior note.

## 2013-03-25 NOTE — Telephone Encounter (Signed)
Done erx 

## 2013-03-31 NOTE — Telephone Encounter (Signed)
Need to be more specific, does pt mean the hydrocodone or another med? (remember pt has dementia, may need to speak to family?)

## 2013-03-31 NOTE — Telephone Encounter (Signed)
Requesting rx for pin needles be sent to mail order pharmacy. Pharmacy fax # [phone],

## 2013-04-01 MED ORDER — INSULIN PEN NEEDLE 31G X 5 MM MISC: Status: DC

## 2013-04-01 NOTE — Telephone Encounter (Signed)
Called the patients daughter.  They were not requesting any pain medication, but to have pen needles sent in with mail order.  Request has [facility] to [facility]

## 2013-04-09 ENCOUNTER — Other Ambulatory Visit (INDEPENDENT_AMBULATORY_CARE_PROVIDER_SITE_OTHER): Payer: Medicare HMO

## 2013-04-09 ENCOUNTER — Encounter: Payer: Self-pay | Admitting: Internal Medicine

## 2013-04-09 ENCOUNTER — Ambulatory Visit (INDEPENDENT_AMBULATORY_CARE_PROVIDER_SITE_OTHER): Payer: Medicare HMO | Admitting: Internal Medicine

## 2013-04-09 VITALS — BP 124/78 | HR 65 | Temp 97.8°F

## 2013-04-09 DIAGNOSIS — R112 Nausea with vomiting, unspecified: Secondary | ICD-10-CM | POA: Insufficient documentation

## 2013-04-09 DIAGNOSIS — R32 Unspecified urinary incontinence: Secondary | ICD-10-CM

## 2013-04-09 DIAGNOSIS — I10 Essential (primary) hypertension: Secondary | ICD-10-CM

## 2013-04-09 DIAGNOSIS — F068 Other specified mental disorders due to known physiological condition: Secondary | ICD-10-CM

## 2013-04-09 LAB — URINALYSIS, ROUTINE W REFLEX MICROSCOPIC
Bilirubin Urine: NEGATIVE
KETONES UR: NEGATIVE
Nitrite: POSITIVE — AB
PH: 6 (ref 5.0–8.0)
SPECIFIC GRAVITY, URINE: 1.02 (ref 1.000–1.030)
Total Protein, Urine: NEGATIVE
URINE GLUCOSE: NEGATIVE
Urobilinogen, UA: 0.2 (ref 0.0–1.0)

## 2013-04-09 MED ORDER — ONDANSETRON HCL 4 MG PO TABS: 4.0000 mg | ORAL_TABLET | Freq: Three times a day (TID) | ORAL | Status: DC | PRN

## 2013-04-09 MED ORDER — OMEPRAZOLE 20 MG PO CPDR: 20.0000 mg | DELAYED_RELEASE_CAPSULE | Freq: Every day | ORAL | Status: AC

## 2013-04-09 MED ORDER — CEPHALEXIN 500 MG PO CAPS: 500.0000 mg | ORAL_CAPSULE | Freq: Four times a day (QID) | ORAL | Status: DC

## 2013-04-09 NOTE — Assessment & Plan Note (Signed)
Mild , but with nausea will need UA and cx given her fatigue and nausea - r/o UTI, I think suspicoius enough for empiric cephalexin asd -  to f/u any worsening symptoms or concerns

## 2013-04-09 NOTE — Progress Notes (Signed)
Pre-visit discussion using our clinic review tool. No additional management support is needed unless otherwise documented below in the visit note.  

## 2013-04-09 NOTE — Patient Instructions (Signed)
OK to stop the pepcid Please take all new medication as prescribed  - the prilosec, zofran for nausea, and antibiotic Please continue all other medications as before, and refills have been done if requested. Please have the pharmacy call with any other refills you may need.  Please give specimen in the office, that we can send for testing at the lab  You will be contacted by phone if any changes need to be made immediately.  Otherwise, you will receive a letter about your results with an explanation, but please check with MyChart first.  Please return in 3 months, or sooner if needed

## 2013-04-09 NOTE — Assessment & Plan Note (Signed)
stable overall by history and exam, recent data reviewed with pt, and pt to continue medical treatment as before,  to f/u any worsening symptoms or concerns BP Readings from Last 3 Encounters:  04/09/13 124/78  02/16/13 142/70  01/04/13 142/68

## 2013-04-09 NOTE — Assessment & Plan Note (Signed)
stable overall by history and exam, recent data reviewed with pt, and pt to continue medical treatment as before,  to f/u any worsening symptoms or concerns Lab Results  Component Value Date   WBC 9.3 12/29/2012   HGB 10.9* 12/29/2012   HCT 32.5* 12/29/2012   PLT 349 12/29/2012   GLUCOSE 230* 12/29/2012   CHOL 126 11/05/2011   TRIG 84.0 11/05/2011   HDL 47.00 11/05/2011   LDLCALC 62 11/05/2011   ALT 13 11/05/2011   AST 23 11/05/2011   NA 135 12/29/2012   K 4.1 12/29/2012   CL 99 12/29/2012   CREATININE 0.74 12/29/2012   BUN 17 12/29/2012   CO2 22 12/29/2012   TSH 2.713 12/27/2012   INR 1.02 05/23/2012   HGBA1C 6.0* 12/27/2012   MICROALBUR 2.1* 11/05/2011    

## 2013-04-09 NOTE — Progress Notes (Signed)
Subjective:    Patient ID: Alicia Kent, female    DOB: 1930/11/07, 80 y.o.   MRN: [id number]  HPI Here for planned 3 mo f/u, but incidentally with 3 day onset intermittent nausea (vomiti x 2) and Denies worsening reflux, abd pain, dysphagia, bowel change or blood. Pt denies chest pain, increased sob or doe, wheezing, orthopnea, PND, increased LE swelling, palpitations, dizziness or syncope.  Dementia overall stable symptomatically with gradual worsening at best, and not assoc with behavioral changes such as hallucinations, paranoia, or agitation. Past Medical History  Diagnosis Date  . Diabetes mellitus type II   . Hyperlipidemia   . HTN (hypertension)   . GERD (gastroesophageal reflux disease)   . Allergic rhinitis   . Depression   . Dementia     mild  . History of CVA (cerebrovascular accident)   . LBBB (left bundle branch block)   . AAA (abdominal aortic aneurysm)     small, last check in 05/2009, due for repeat in 05/2010  . Carotid stenosis 09/2009    40-59% RICA, 0-39% LICA  . LVH (left ventricular hypertrophy) 10/2009    Moderate, EF 55-60%, no regional wall motion abnormalities  . Shellfish allergy     anaphylaxis  . Osteopenia   . Vitamin D deficiency   . Fracture of rib of left side 12/2009  . Asthma 01/27/2011  . Diastolic CHF 08/02/2011   Past Surgical History  Procedure Laterality Date  . Ankle surgery      Right, by Ortho in [place]  . Tracheostomy  2010    anaphylactic episode on vent  . Tubal ligation      reports that she has been smoking.  She does not have any smokeless tobacco history on file. She reports that she does not drink alcohol or use illicit drugs. family history includes Diabetes type II in her other; Hypertension in her brother; Rheum arthritis in her sister; Stroke in her brother. Allergies  Allergen Reactions  . Ibuprofen Anaphylaxis  . Shellfish-Derived Products Anaphylaxis    Swelling tongue and throat  . Ace Inhibitors Other (See  Comments)    unknown  . Sulfa Antibiotics Nausea And Vomiting   Current Outpatient Prescriptions on File Prior to Visit  Medication Sig Dispense Refill  . aspirin 81 MG EC tablet Take 81 mg by mouth every morning.       . citalopram (CELEXA) 20 MG tablet Take 1 tablet (20 mg total) by mouth every morning.  90 tablet  3  . donepezil (ARICEPT) 10 MG tablet Take 1 tablet (10 mg total) by mouth daily.  90 tablet  3  . guaifenesin (HUMIBID E) 400 MG TABS tablet Take 1 tablet (400 mg total) by mouth 2 (two) times daily as needed.  60 tablet  11  . HYDROcodone-acetaminophen (NORCO/VICODIN) 5-325 MG per tablet 1 by mouth every 6 hours as needed for mild to moderate pain **DO NOT EXCEED 4GM OF TYLENOL IN 24 HOURS, take 2 by mouth every 6 hours as needed for moderate to severe pain.  120 tablet  0  . insulin glargine (LANTUS) 100 UNIT/ML injection Inject 0.25 mLs (25 Units total) into the skin at bedtime.  30 mL  3  . Insulin Pen Needle (B-D UF III MINI PEN NEEDLES) 31G X 5 MM MISC Use as directed for insulin.  Diagnosis code 250.00  300 each  3  . ipratropium-albuterol (DUONEB) 0.5-2.5 (3) MG/3ML SOLN Take 3 mLs by nebulization every 6 (six) hours as  needed.  360 mL  11  . labetalol (NORMODYNE) 200 MG tablet Take 1 tablet (200 mg total) by mouth 2 (two) times daily.  180 tablet  3  . montelukast (SINGULAIR) 10 MG tablet Take 1 tablet (10 mg total) by mouth at bedtime.  90 tablet  3  . NIFEdipine (PROCARDIA-XL/ADALAT-CC/NIFEDICAL-XL) 30 MG 24 hr tablet Take 1 tablet (30 mg total) by mouth every morning.  90 tablet  3  . risperiDONE (RISPERDAL) 1 MG tablet Take 1 tablet (1 mg total) by mouth 2 (two) times daily.  180 tablet  1   No current facility-administered medications on file prior to visit.   Review of Systems Unable due to dementia    Objective:   Physical Exam BP 124/78  Pulse 65  Temp(Src) 97.8 F (36.6 C) (Oral)  SpO2 95% VS noted,  Constitutional: Pt appears well-developed and  well-nourished.  HENT: Head: NCAT.  Right Ear: External ear normal.  Left Ear: External ear normal.  Eyes: Conjunctivae and EOM are normal. Pupils are equal, round, and reactive to light.  Neck: Normal range of motion. Neck supple.  Cardiovascular: Normal rate and regular rhythm.   Pulmonary/Chest: Effort normal and breath sounds normal.  Abd:  Soft, NT, non-distended, + BS, no flank tender Neurological: Pt is alert. Not confused  Skin: Skin is warm. No erythema.  Psychiatric: Pt behavior is normal. Thought content normal.     Assessment & Plan:

## 2013-04-09 NOTE — Assessment & Plan Note (Addendum)
For anti-emetic prn, exam benign, afeb, doubt constipation, more likely related to possible UTI? For tx,  to f/u any worsening symptoms or concerns, also d/c pepcid, start omeprazole 20  d

## 2013-07-09 ENCOUNTER — Ambulatory Visit: Admitting: Internal Medicine

## 2013-07-16 ENCOUNTER — Encounter: Payer: Self-pay | Admitting: Internal Medicine

## 2013-07-16 ENCOUNTER — Ambulatory Visit (INDEPENDENT_AMBULATORY_CARE_PROVIDER_SITE_OTHER): Payer: Commercial Managed Care - HMO | Admitting: Internal Medicine

## 2013-07-16 ENCOUNTER — Other Ambulatory Visit (INDEPENDENT_AMBULATORY_CARE_PROVIDER_SITE_OTHER): Payer: Commercial Managed Care - HMO

## 2013-07-16 VITALS — BP 110/60 | HR 63 | Temp 97.6°F | Wt 155.0 lb

## 2013-07-16 DIAGNOSIS — R109 Unspecified abdominal pain: Secondary | ICD-10-CM

## 2013-07-16 DIAGNOSIS — I10 Essential (primary) hypertension: Secondary | ICD-10-CM

## 2013-07-16 DIAGNOSIS — I5030 Unspecified diastolic (congestive) heart failure: Secondary | ICD-10-CM

## 2013-07-16 DIAGNOSIS — E119 Type 2 diabetes mellitus without complications: Secondary | ICD-10-CM

## 2013-07-16 DIAGNOSIS — I714 Abdominal aortic aneurysm, without rupture, unspecified: Secondary | ICD-10-CM | POA: Insufficient documentation

## 2013-07-16 DIAGNOSIS — I509 Heart failure, unspecified: Secondary | ICD-10-CM

## 2013-07-16 DIAGNOSIS — F068 Other specified mental disorders due to known physiological condition: Secondary | ICD-10-CM

## 2013-07-16 HISTORY — DX: Abdominal aortic aneurysm, without rupture, unspecified: I71.40

## 2013-07-16 HISTORY — DX: Abdominal aortic aneurysm, without rupture: I71.4

## 2013-07-16 LAB — CBC WITH DIFFERENTIAL/PLATELET
Basophils Absolute: 0.1 10*3/uL (ref 0.0–0.1)
Basophils Relative: 0.8 % (ref 0.0–3.0)
EOS ABS: 0.3 10*3/uL (ref 0.0–0.7)
Eosinophils Relative: 3.1 % (ref 0.0–5.0)
HEMATOCRIT: 39 % (ref 36.0–46.0)
HEMOGLOBIN: 13.1 g/dL (ref 12.0–15.0)
LYMPHS ABS: 1.9 10*3/uL (ref 0.7–4.0)
Lymphocytes Relative: 20.3 % (ref 12.0–46.0)
MCHC: 33.7 g/dL (ref 30.0–36.0)
MCV: 88.1 fl (ref 78.0–100.0)
MONO ABS: 0.6 10*3/uL (ref 0.1–1.0)
MONOS PCT: 6.5 % (ref 3.0–12.0)
NEUTROS ABS: 6.5 10*3/uL (ref 1.4–7.7)
Neutrophils Relative %: 69.3 % (ref 43.0–77.0)
PLATELETS: 344 10*3/uL (ref 150.0–400.0)
RBC: 4.42 Mil/uL (ref 3.87–5.11)
RDW: 14.3 % (ref 11.5–15.5)
WBC: 9.4 10*3/uL (ref 4.0–10.5)

## 2013-07-16 LAB — BASIC METABOLIC PANEL
BUN: 13 mg/dL (ref 6–23)
CO2: 24 meq/L (ref 19–32)
Calcium: 8.9 mg/dL (ref 8.4–10.5)
Chloride: 103 mEq/L (ref 96–112)
Creatinine, Ser: 0.8 mg/dL (ref 0.4–1.2)
GFR: 75.32 mL/min (ref >60.00)
GLUCOSE: 84 mg/dL (ref 70–99)
Potassium: 4.1 mEq/L (ref 3.5–5.1)
SODIUM: 136 meq/L (ref 135–145)

## 2013-07-16 LAB — HEPATIC FUNCTION PANEL
ALK PHOS: 79 U/L (ref 39–117)
ALT: 8 U/L (ref 0–35)
AST: 14 U/L (ref 0–37)
Albumin: 3.6 g/dL (ref 3.5–5.2)
BILIRUBIN DIRECT: 0.1 mg/dL (ref 0.0–0.3)
Total Bilirubin: 0.6 mg/dL (ref 0.2–1.2)
Total Protein: 7.4 g/dL (ref 6.0–8.3)

## 2013-07-16 LAB — LIPID PANEL
CHOL/HDL RATIO: 5
Cholesterol: 194 mg/dL (ref 0–200)
HDL: 40.8 mg/dL (ref >39.00)
LDL Cholesterol: 133 mg/dL — ABNORMAL HIGH (ref 0–99)
Triglycerides: 102 mg/dL (ref 0.0–149.0)
VLDL: 20.4 mg/dL (ref 0.0–40.0)

## 2013-07-16 LAB — TSH: TSH: 2.54 u[IU]/mL (ref 0.35–4.50)

## 2013-07-16 LAB — HEMOGLOBIN A1C: HEMOGLOBIN A1C: 5.4 % (ref 4.6–6.5)

## 2013-07-16 NOTE — Assessment & Plan Note (Signed)
stable overall by history and exam, recent data reviewed with pt, and pt to continue medical treatment as before,  to f/u any worsening symptoms or concerns Lab Results  Component Value Date   WBC 9.3 12/29/2012   HGB 10.9* 12/29/2012   HCT 32.5* 12/29/2012   PLT 349 12/29/2012   GLUCOSE 230* 12/29/2012   CHOL 126 11/05/2011   TRIG 84.0 11/05/2011   HDL 47.00 11/05/2011   LDLCALC 62 11/05/2011   ALT 13 11/05/2011   AST 23 11/05/2011   NA 135 12/29/2012   K 4.1 12/29/2012   CL 99 12/29/2012   CREATININE 0.74 12/29/2012   BUN 17 12/29/2012   CO2 22 12/29/2012   TSH 2.713 12/27/2012   INR 1.02 05/23/2012   HGBA1C 6.0* 12/27/2012   MICROALBUR 2.1* 11/05/2011

## 2013-07-16 NOTE — Patient Instructions (Signed)
Please continue all other medications as before, and refills have been done if requested. Please have the pharmacy call with any other refills you may need.  Please keep your appointments with your specialists as you may have planned  You are otherwise up to date with prevention measures today.  Please continue your efforts at being more active, low cholesterol diet, and weight control.  Please go to the LAB in the Basement (turn left off the elevator) for the tests to be done today  You will be contacted by phone if any changes need to be made immediately.  Otherwise, you will receive a letter about your results with an explanation, but please check with MyChart first.  You will be contacted regarding the referral for: abdomen ultrasound to check the aneurysm  Please return in 6 months, or sooner if needed

## 2013-07-16 NOTE — Assessment & Plan Note (Signed)
stable overall by history and exam, recent data reviewed with pt, and pt to continue medical treatment as before,  to f/u any worsening symptoms or concerns BP Readings from Last 3 Encounters:  07/16/13 110/60  04/09/13 124/78  02/16/13 142/70

## 2013-07-16 NOTE — Progress Notes (Signed)
Pre visit review using our clinic review tool, if applicable. No additional management support is needed unless otherwise documented below in the visit note. 

## 2013-07-16 NOTE — Progress Notes (Signed)
Subjective:    Patient ID: Alicia Kent, female    DOB: [birth year], 80 y.o.   MRN: [id number]  HPI  Here to f/u with daughter overall doing ok,  Has little energy, some decreased appetite, 3 lb wt loss, sleeping more during the day, more housebound and does not want to leave, or go outside.Has occas agitation, says up some nights talking to people who arent there, fatigued today since up all last night  Pt denies chest pain, increased sob or doe, wheezing, orthopnea, PND, increased LE swelling, palpitations, dizziness or syncope.  Pt denies polydipsia, polyuria, or low sugar symptoms such as weakness or confusion improved with po intake.  Pt denies new neurological symptoms such as new headache, or facial or extremity weakness or numbness.   Pt states overall good compliance with meds, has been trying to follow lower cholesterol, diabetic diet, with wt overall stable,  but little exercise however.  Dementia overall stable symptomatically with gradual worsening at best, and not assoc with behavioral changes such as paranoia. Acutally more content, thinks se is spending more time with husband, who has passed away.  Legs seemed a bbit weaker today, no recent falls.  Pt continues to have recurring LBP without change in severity, bowel or bladder change, fever, wt loss and not asking for pain meds. Does have occas abd pain mid abd and daugther asks for f/u AAA.  Daughter asks for signing of out of hosp DNR form signed  - done Past Medical History  Diagnosis Date  . Diabetes mellitus type II   . Hyperlipidemia   . HTN (hypertension)   . GERD (gastroesophageal reflux disease)   . Allergic rhinitis   . Depression   . Dementia     mild  . History of CVA (cerebrovascular accident)   . LBBB (left bundle branch block)   . AAA (abdominal aortic aneurysm)     small, last check in 05/2009, due for repeat in 05/2010  . Carotid stenosis 09/2009    40-59% RICA, 0-39% LICA  . LVH (left ventricular hypertrophy)  10/2009    Moderate, EF 55-60%, no regional wall motion abnormalities  . Shellfish allergy     anaphylaxis  . Osteopenia   . Vitamin D deficiency   . Fracture of rib of left side 12/2009  . Asthma 01/27/2011  . Diastolic CHF 08/02/2011   Past Surgical History  Procedure Laterality Date  . Ankle surgery      Right, by [place] in [place]  . Tracheostomy  2010    anaphylactic episode on vent  . Tubal ligation      reports that she has been smoking.  She does not have any smokeless tobacco history on file. She reports that she does not drink alcohol or use illicit drugs. family history includes Diabetes type II in her other; Hypertension in her brother; Rheum arthritis in her sister; Stroke in her brother. Allergies  Allergen Reactions  . Ibuprofen Anaphylaxis  . Shellfish-Derived Products Anaphylaxis    Swelling tongue and throat  . Ace Inhibitors Other (See Comments)    unknown  . Sulfa Antibiotics Nausea And Vomiting   Current Outpatient Prescriptions on File Prior to Visit  Medication Sig Dispense Refill  . aspirin 81 MG EC tablet Take 81 mg by mouth every morning.       . citalopram (CELEXA) 20 MG tablet Take 1 tablet (20 mg total) by mouth every morning.  90 tablet  3  . donepezil (ARICEPT) 10 MG  tablet Take 1 tablet (10 mg total) by mouth daily.  90 tablet  3  . guaifenesin (HUMIBID E) 400 MG TABS tablet Take 1 tablet (400 mg total) by mouth 2 (two) times daily as needed.  60 tablet  11  . HYDROcodone-acetaminophen (NORCO/VICODIN) 5-325 MG per tablet 1 by mouth every 6 hours as needed for mild to moderate pain **DO NOT EXCEED 4GM OF TYLENOL IN 24 HOURS, take 2 by mouth every 6 hours as needed for moderate to severe pain.  120 tablet  0  . insulin glargine (LANTUS) 100 UNIT/ML injection Inject 0.25 mLs (25 Units total) into the skin at bedtime.  30 mL  3  . Insulin Pen Needle (B-D UF III MINI PEN NEEDLES) 31G X 5 MM MISC Use as directed for insulin.  Diagnosis code 250.00  300 each   3  . ipratropium-albuterol (DUONEB) 0.5-2.5 (3) MG/3ML SOLN Take 3 mLs by nebulization every 6 (six) hours as needed.  360 mL  11  . labetalol (NORMODYNE) 200 MG tablet Take 1 tablet (200 mg total) by mouth 2 (two) times daily.  180 tablet  3  . montelukast (SINGULAIR) 10 MG tablet Take 1 tablet (10 mg total) by mouth at bedtime.  90 tablet  3  . NIFEdipine (PROCARDIA-XL/ADALAT-CC/NIFEDICAL-XL) 30 MG 24 hr tablet Take 1 tablet (30 mg total) by mouth every morning.  90 tablet  3  . omeprazole (PRILOSEC) 20 MG capsule Take 1 capsule (20 mg total) by mouth daily.  30 capsule  11  . ondansetron (ZOFRAN) 4 MG tablet Take 1 tablet (4 mg total) by mouth every 8 (eight) hours as needed for nausea or vomiting.  20 tablet  0  . risperiDONE (RISPERDAL) 1 MG tablet Take 1 tablet (1 mg total) by mouth 2 (two) times daily.  180 tablet  1   No current facility-administered medications on file prior to visit.   Review of Systems  Constitutional: Negative for unusual diaphoresis or other sweats  HENT: Negative for ringing in ear Eyes: Negative for double vision or worsening visual disturbance.  Respiratory: Negative for choking and stridor.   Gastrointestinal: Negative for vomiting or other signifcant bowel change Genitourinary: Negative for hematuria or decreased urine volume.  Musculoskeletal: Negative for other MSK pain or swelling Skin: Negative for color change and worsening wound.  Neurological: Negative for tremors and numbness other than noted  Psychiatric/Behavioral: Negative for decreased concentration or agitation other than above       Objective:   Physical Exam BP 110/60  Pulse 63  Temp(Src) 97.6 F (36.4 C) (Oral)  Wt 155 lb (70.308 kg)  SpO2 97% VS noted,  Constitutional: Pt appears well-developed, well-nourished.  HENT: Head: NCAT.  Right Ear: External ear normal.  Left Ear: External ear normal.  Eyes: . Pupils are equal, round, and reactive to light. Conjunctivae and EOM are  normal Neck: Normal range of motion. Neck supple.  Cardiovascular: Normal rate and regular rhythm.   Pulmonary/Chest: Effort normal and breath sounds normal.  Abd:  Soft, NT, ND, + BS Neurological: Pt is alert. + confused , motor grossly intact Skin: Skin is warm. No rash Psychiatric: Pt behavior is normal. No agitation.     Assessment & Plan:

## 2013-07-16 NOTE — Assessment & Plan Note (Signed)
Ok for u/s f/u, would avoid ct with contrast given risk at this time

## 2013-07-16 NOTE — Assessment & Plan Note (Signed)
.  stable overall by history and exam, recent data reviewed with pt, and pt to continue medical treatment as before,  to f/u any worsening symptoms or concerns Lab Results  Component Value Date   HGBA1C 6.0* 12/27/2012   For f/u labs

## 2013-07-16 NOTE — Assessment & Plan Note (Addendum)
With gradual worsening, ok symtpomatcialy for now, daughter remains supportive, no new needs ID'd today  Out of hosp DNR form signed today

## 2013-08-02 ENCOUNTER — Other Ambulatory Visit: Payer: Commercial Managed Care - HMO

## 2013-10-04 ENCOUNTER — Telehealth: Payer: Self-pay | Admitting: *Deleted

## 2013-10-04 DIAGNOSIS — G4734 Idiopathic sleep related nonobstructive alveolar hypoventilation: Secondary | ICD-10-CM

## 2013-10-04 DIAGNOSIS — J449 Chronic obstructive pulmonary disease, unspecified: Secondary | ICD-10-CM

## 2013-10-04 NOTE — Telephone Encounter (Signed)
Order has been done.

## 2013-10-04 NOTE — Telephone Encounter (Signed)
Left msg on triage stating [facility] is no longer going to be a provider for [facility]. Need md to send order to [facility] for mom to get her night oxygen machine along with back up tanks.../lmb   Called tracy back inform her md not in office today will give her call bck tomorrow with his response...Raechel Chute/lmb

## 2013-10-05 NOTE — Telephone Encounter (Signed)
Notified daughter [name](Tracy) md has place the referral. They will be contacted by [facility] once they received information...Raechel Chute/lmb

## 2013-10-25 ENCOUNTER — Telehealth: Payer: Self-pay

## 2013-10-25 NOTE — Telephone Encounter (Signed)
Tried to return call, unable to leave a message

## 2013-10-25 NOTE — Telephone Encounter (Signed)
Ok for appt earlier, next available

## 2013-10-25 NOTE — Telephone Encounter (Signed)
Received call stating that this pt has had a decrease in appetite, more of an accumulation of sputum which is more difficult for pt to spit out, and that pt's lack of sleep is increasing. When the pt does go to sleep it is difficult for the pt to awaken.  Pt has an appt w/ you on 01/14/14 @ 2:15 pm, pt's caretaker wanted to know if her appointment should be moved to a sooner slot. Please advise

## 2013-10-28 IMAGING — CT CT ABD-PELV W/ CM
2 of 5 series · 17 of 46 positions shown, 19 images · IV contrast (APPLIED)
Comparison: None.

CLINICAL DATA: Abdominal pain and back pain.

CT ABDOMEN AND PELVIS WITH CONTRAST
TECHNIQUE: Multidetector CT imaging of the abdomen and pelvis was
performed following the standard protocol during bolus
administration of intravenous contrast.
Contrast: 100mL OMNIPAQUE IOHEXOL 300 MG/ML IV SOLN

[Series 2: abd/pelv with 5.0 b31f st · axial · 0.80mm/px · z∈[-480,-74]mm · 14 of 93 slices shown, 16 images]
[im 6/93  soft-tissue]
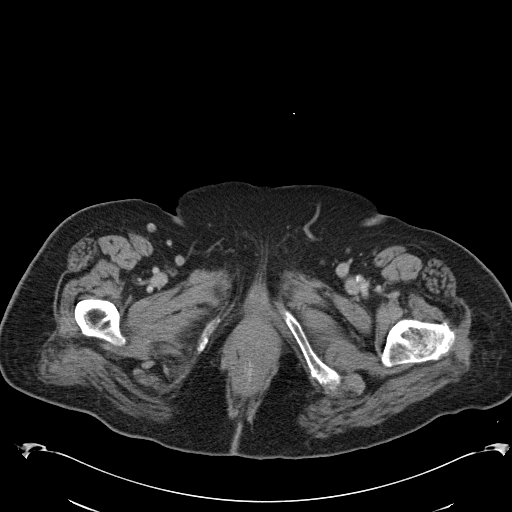
[im 6/93  bone]
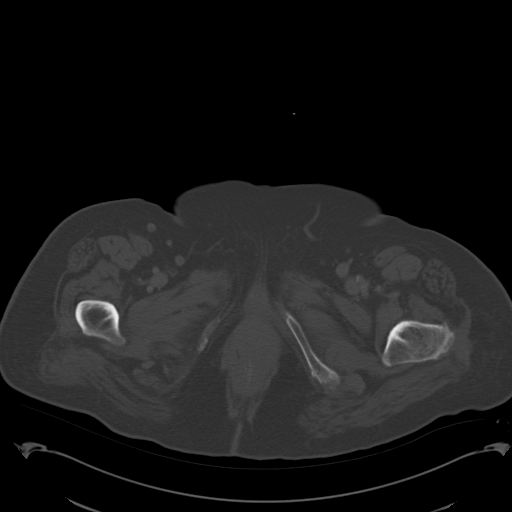
[im 11/93  soft-tissue]
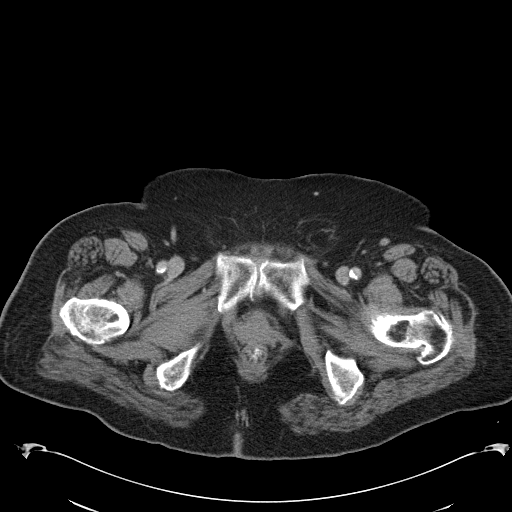
[im 21/93  soft-tissue]
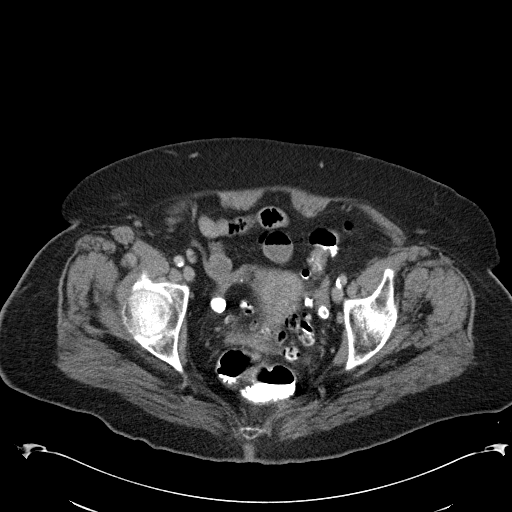
[im 26/93  soft-tissue]
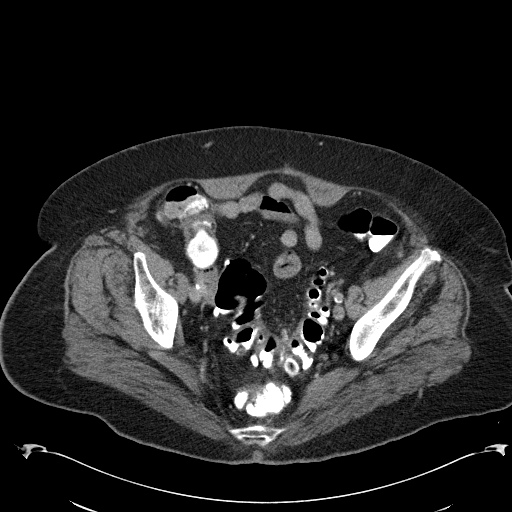
[im 31/93  soft-tissue]
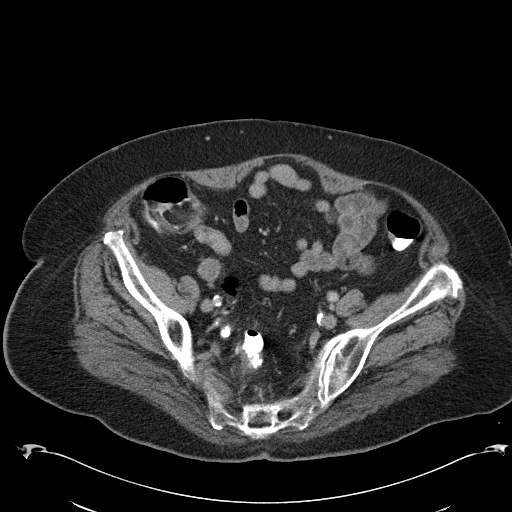
[im 36/93  soft-tissue]
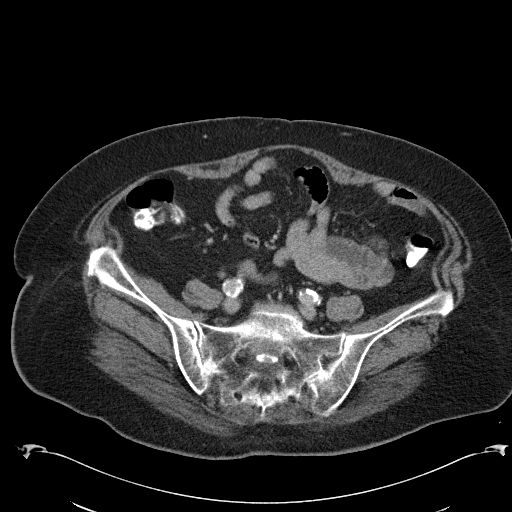
[im 41/93  soft-tissue]
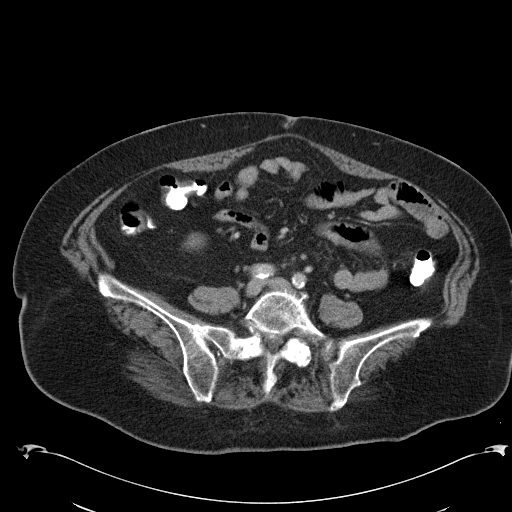
[im 52/93  soft-tissue]
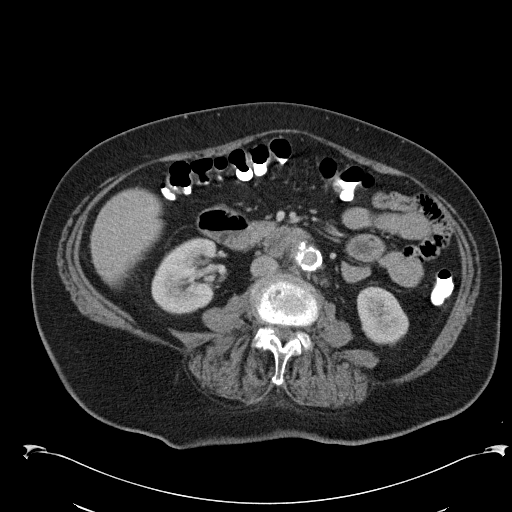
[im 57/93  soft-tissue]
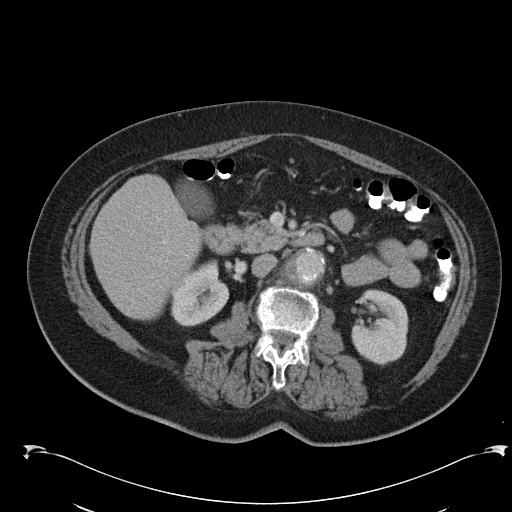
[im 57/93  bone]
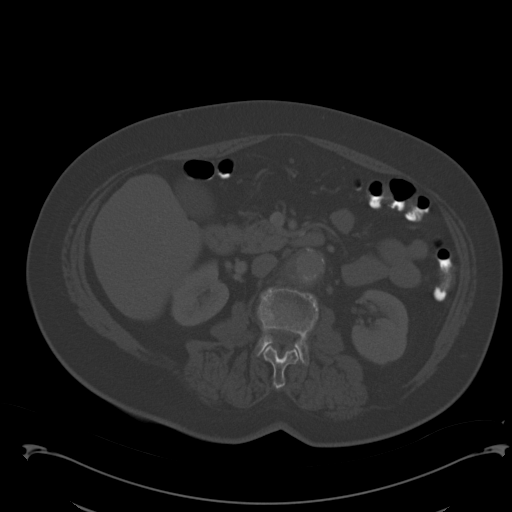
[im 62/93  soft-tissue]
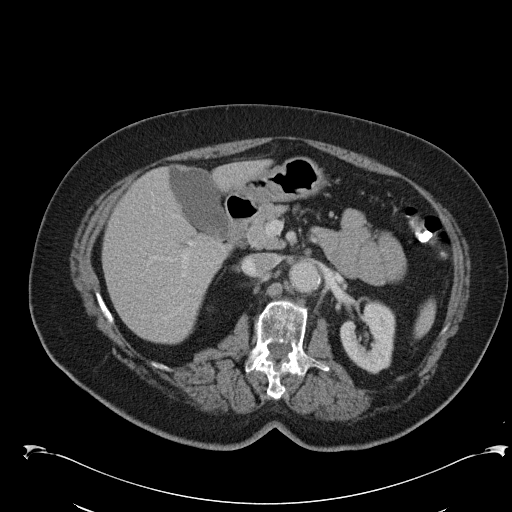
[im 67/93  soft-tissue]
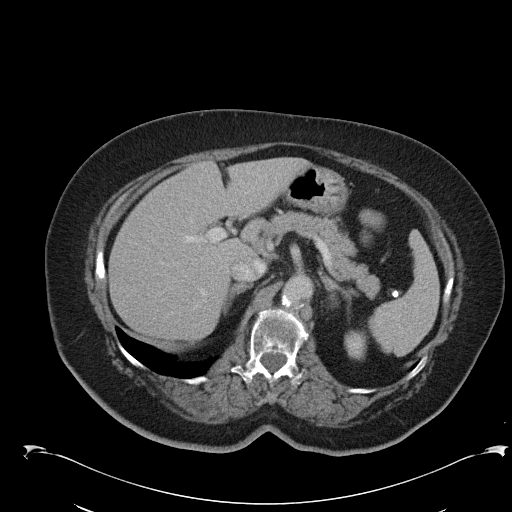
[im 72/93  soft-tissue]
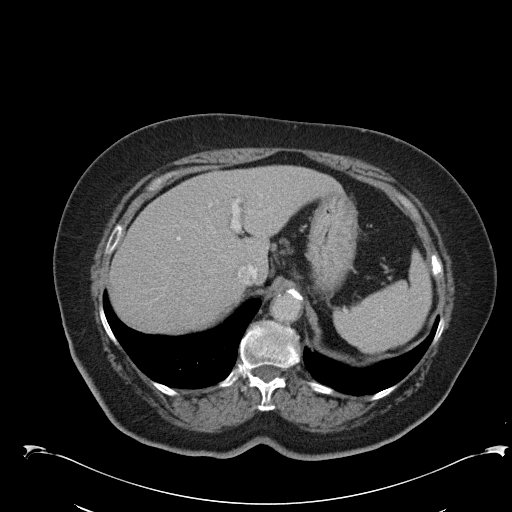
[im 82/93  soft-tissue]
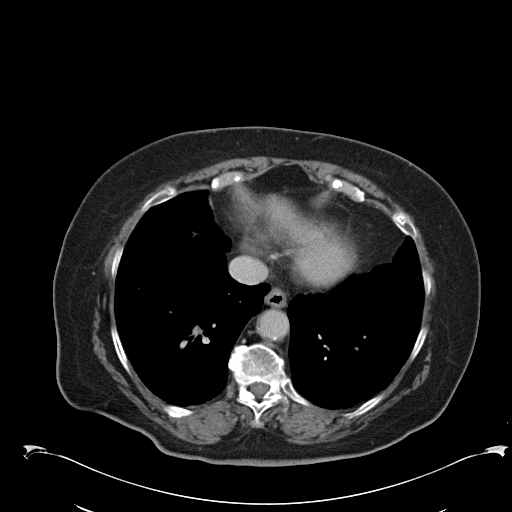
[im 87/93  soft-tissue]
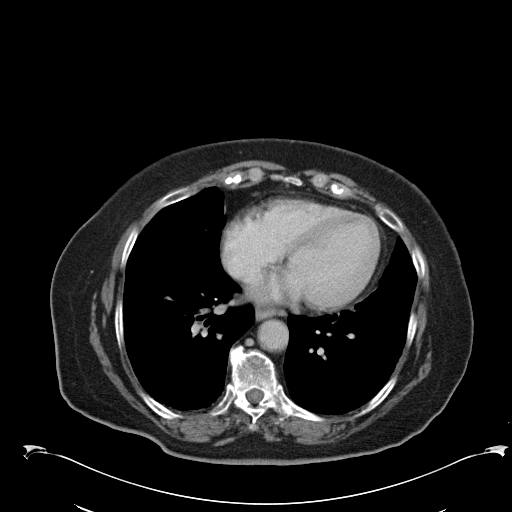

[Series 5: coronals · coronal · 0.90mm/px · 3 of 86 slices shown]
[im 29/86  soft-tissue]
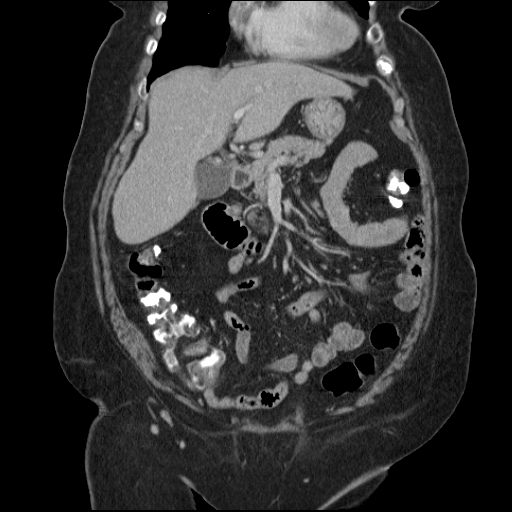
[im 38/86  soft-tissue]
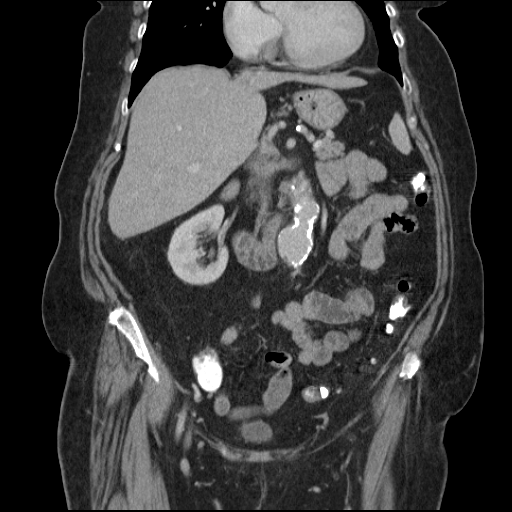
[im 48/86  soft-tissue]
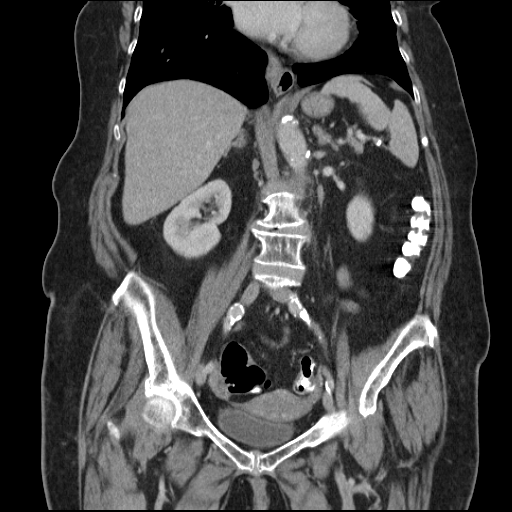

[17 of 46 positions shown; findings below may reference images not displayed]

FINDINGS: There is mild aneurysmal disease of the abdominal aorta.
Initial infrarenal segment shows focal dilatation measuring up to
approximately 3.2 cm in AP diameter.  The distal aorta is also
mildly dilated, measuring up to 3.3 - 3.4 cm in AP diameter. There
is associated stranding in the retroperitoneal fat immediately to
the right of the initial aneurysmal segment of the aorta and
extending to the right posterolateral margin of the aorta.  This is
nonspecific and does not have the typical appearance of aortic leak
but may imply an inflammatory component.  No soft tissue gas is
seen surrounding the aorta to suggest infection.  There is no
evidence of aortic dissection.

The liver, gallbladder, pancreas, spleen and kidneys are
unremarkable.  Left adrenal gland shows mild thickening without
focal mass.  No evidence of enlarged lymph nodes.  No abnormal
fluid collections.  Diverticulosis noted of the colon without
evidence of diverticulitis.  Spine shows degenerative spondylosis,
most prominently at L3-4.  No hernias are identified.  The bladder
is unremarkable.
IMPRESSION: Mild aneurysmal disease of the abdominal aorta with maximal AP
diameter of 3.4 cm in the distal aorta.  Initial infrarenal segment
of focal dilatation does show surrounding stranding in the
retroperitoneum which does not have the appearance of an overt leak
but may imply an inflammatory component of aneurysm.

## 2013-10-29 NOTE — Telephone Encounter (Signed)
Caretaker notified, she states that pt's condition has improved greatly, she will give [place] a call and let us know if she needs to move her appt up

## 2013-11-06 IMAGING — CR DG CHEST 2V
2 series · 2 of 2 positions shown · non-contrast
Comparison: 02/06/2011

CLINICAL DATA: Cough and congestion.  History of smoking.

CHEST - 2 VIEW

[x chest ap]
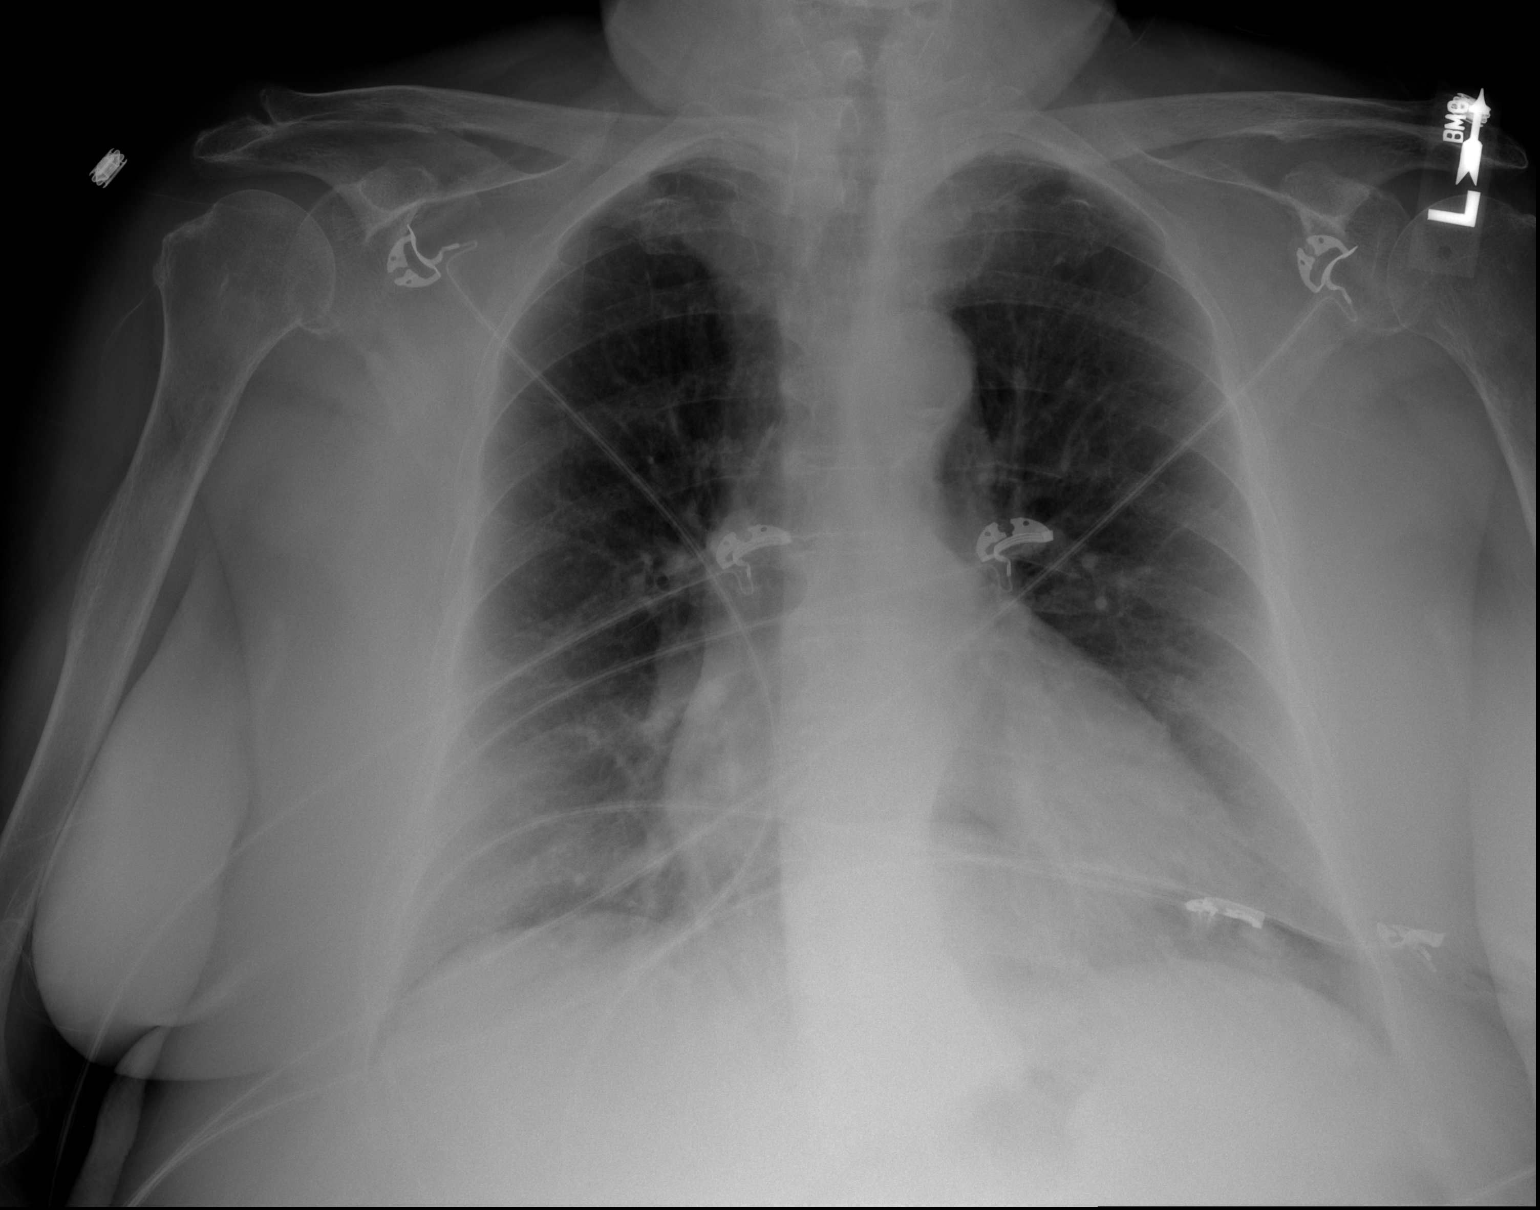

[w chest decub]
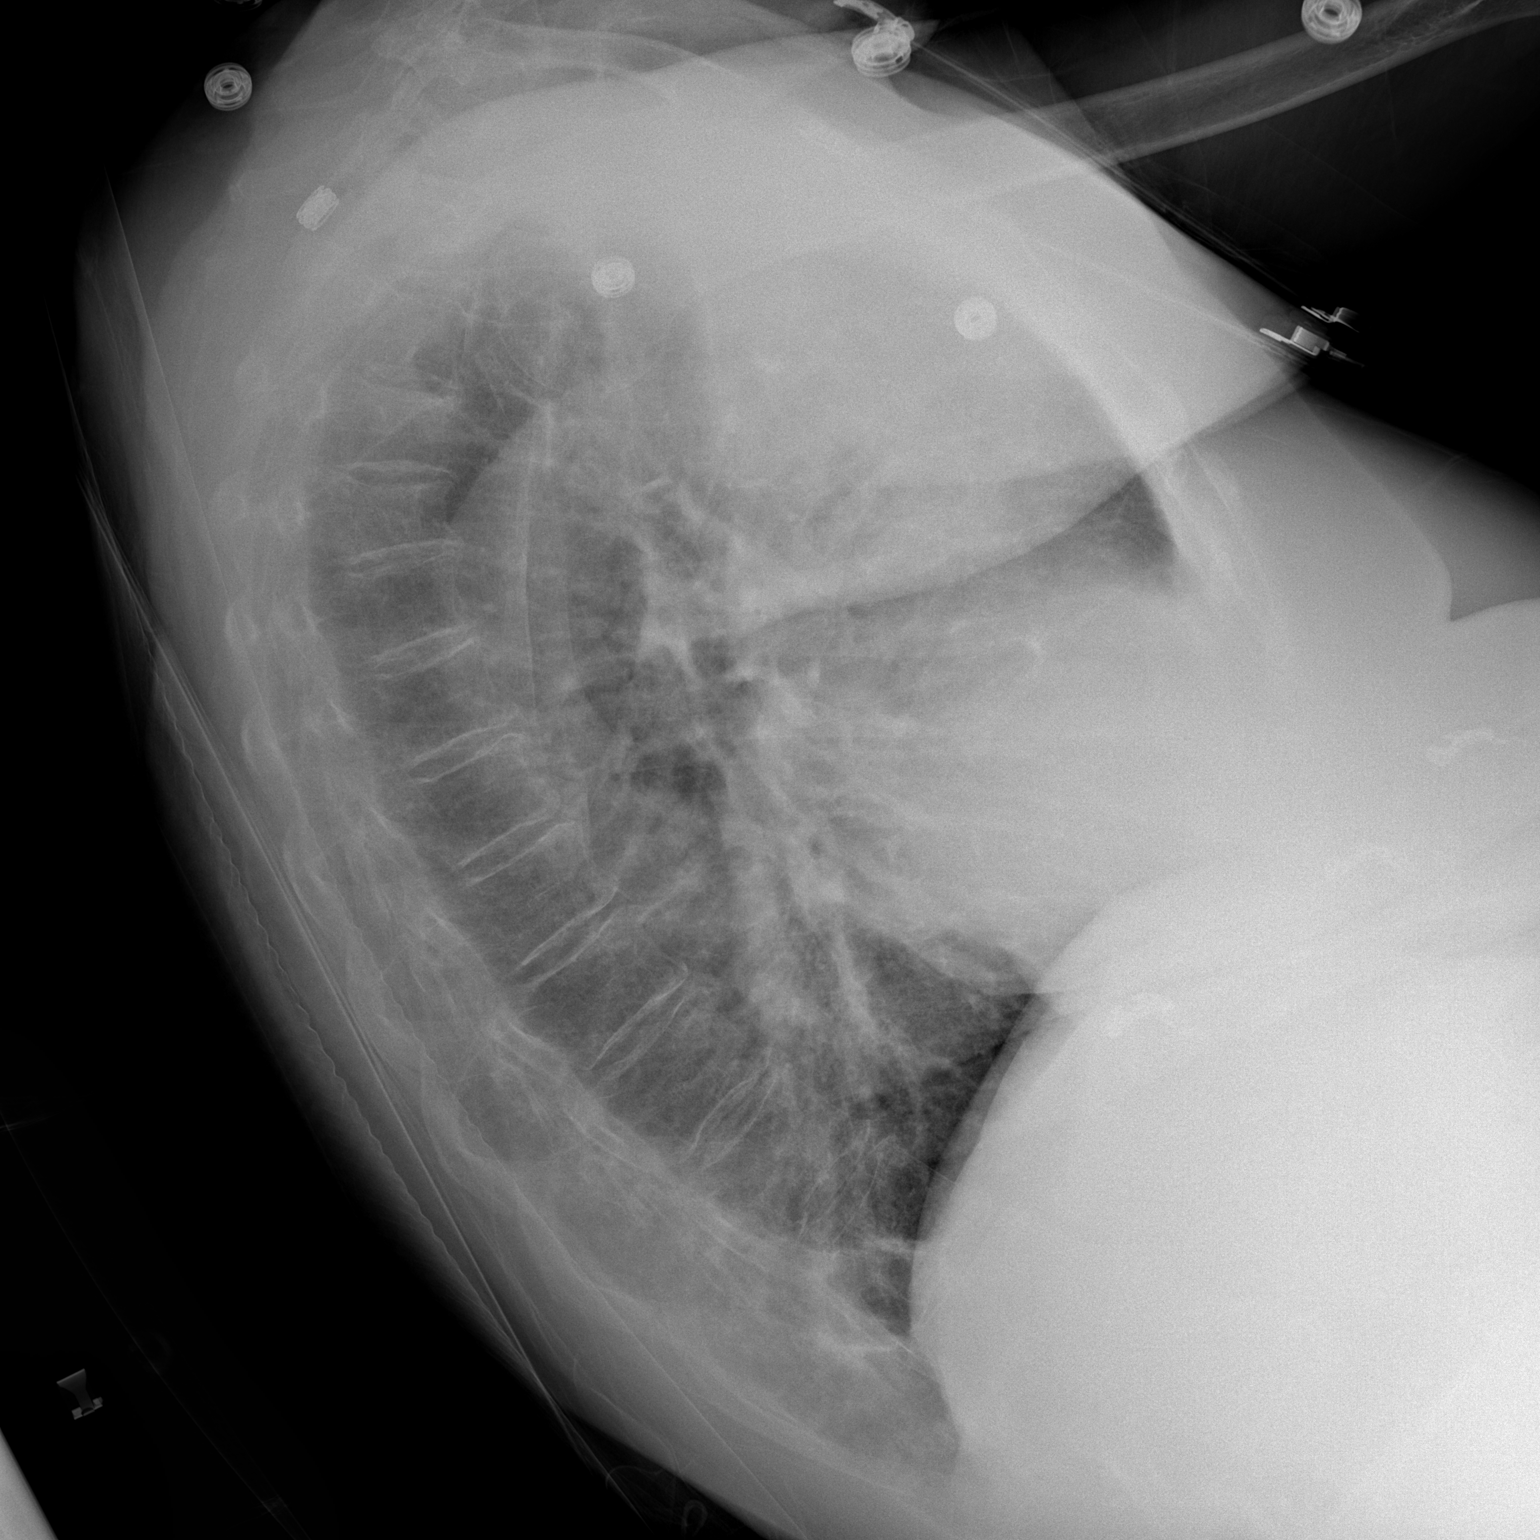

[2 of 2 positions shown; findings below may reference images not displayed]

FINDINGS: Lateral view degraded by patient arm position.

Apical lordotic frontal view.  Mild cardiomegaly. No pleural
effusion or pneumothorax.  Chronic interstitial thickening. No
congestive failure.

Mild scarring at the bases. No lobar consolidation.  Numerous leads
and wires project over the chest.
IMPRESSION: Cardiomegaly and COPD/chronic bronchitis.  No acute superimposed
process.

## 2013-12-14 ENCOUNTER — Ambulatory Visit (INDEPENDENT_AMBULATORY_CARE_PROVIDER_SITE_OTHER): Payer: Commercial Managed Care - HMO | Admitting: Internal Medicine

## 2013-12-14 ENCOUNTER — Encounter: Payer: Self-pay | Admitting: Internal Medicine

## 2013-12-14 VITALS — BP 120/68 | HR 67 | Temp 97.9°F | Wt 150.0 lb

## 2013-12-14 DIAGNOSIS — F0391 Unspecified dementia with behavioral disturbance: Secondary | ICD-10-CM

## 2013-12-14 DIAGNOSIS — J449 Chronic obstructive pulmonary disease, unspecified: Secondary | ICD-10-CM

## 2013-12-14 DIAGNOSIS — Z23 Encounter for immunization: Secondary | ICD-10-CM

## 2013-12-14 DIAGNOSIS — N3941 Urge incontinence: Secondary | ICD-10-CM

## 2013-12-14 DIAGNOSIS — F03918 Unspecified dementia, unspecified severity, with other behavioral disturbance: Secondary | ICD-10-CM

## 2013-12-14 MED ORDER — CEPHALEXIN 500 MG PO CAPS: 500.0000 mg | ORAL_CAPSULE | Freq: Four times a day (QID) | ORAL | Status: DC

## 2013-12-14 MED ORDER — CITALOPRAM HYDROBROMIDE 20 MG PO TABS: 20.0000 mg | ORAL_TABLET | Freq: Every morning | ORAL | Status: AC

## 2013-12-14 MED ORDER — LABETALOL HCL 200 MG PO TABS: 200.0000 mg | ORAL_TABLET | Freq: Two times a day (BID) | ORAL | Status: AC

## 2013-12-14 MED ORDER — MONTELUKAST SODIUM 10 MG PO TABS: 10.0000 mg | ORAL_TABLET | Freq: Every day | ORAL | Status: AC

## 2013-12-14 MED ORDER — HYDROCODONE-ACETAMINOPHEN 5-325 MG PO TABS: ORAL_TABLET | ORAL | Status: DC

## 2013-12-14 MED ORDER — RISPERIDONE 1 MG PO TABS: 1.0000 mg | ORAL_TABLET | Freq: Two times a day (BID) | ORAL | Status: DC

## 2013-12-14 MED ORDER — DONEPEZIL HCL 10 MG PO TABS: 10.0000 mg | ORAL_TABLET | Freq: Every day | ORAL | Status: AC

## 2013-12-14 NOTE — Patient Instructions (Signed)
You had the flu shot today  Please take all new medication as prescribed - the antibiotic  OK to use immodium as needed for loose stools  Please continue all other medications as before, and refills have been done if requested.  Please have the pharmacy call with any other refills you may need.  Please keep your appointments with your specialists as you may have planned  You will be contacted regarding the referral for: Home Health to assess for needs

## 2013-12-14 NOTE — Assessment & Plan Note (Signed)
With presumed uti , likely cause for GI complaints secondary it seems, for empiric antibx, pt unable to give specimen,  to f/u any worsening symptoms or concerns

## 2013-12-14 NOTE — Assessment & Plan Note (Signed)
With graduall worsening, now chronic incontinence, needs more help at home with adl's, [facility]

## 2013-12-14 NOTE — Progress Notes (Signed)
Subjective:    Patient ID: Alicia Kent, female    DOB: [birth year], 80 y.o.   MRN: [id number]  HPI  Her to f/u, has recurrent loose stools , but in past 2 wks with loose stools, occas watery stools, and even constipation, though duaghter very careful about diet and no significant change. Pt c/o abd pain vaguely incresaed, but unable to say further seveirty, location, or other details.  No fever, did have an episode of vomiting x 2 days 1 mo ago, ? Better with omeprazole. But has been on this for more than 1 mo.  No blood in stools. Also with persstent urinary incont for 2 wks, usually on occas bofore. ALso strong odor recent, and unable to give specimen.  Does also have bilat heel pain, sits in chair most of time in same position with heals on the floor board,   Pt denies chest pain, increased sob or doe, wheezing, orthopnea, PND, increased LE swelling, palpitations, dizziness or syncope. Dementia overall  with gradual worsening, and assoc with behavioral changes such as hallucinations, paranoia, or agitation, but improved with risperdal. No fever, but has lost a few lbs Wt Readings from Last 3 Encounters:  12/14/13 150 lb (68.04 kg)  07/16/13 155 lb (70.308 kg)  02/16/13 158 lb 4 oz (71.782 kg)   Past Medical History  Diagnosis Date  . Diabetes mellitus type II   . Hyperlipidemia   . HTN (hypertension)   . GERD (gastroesophageal reflux disease)   . Allergic rhinitis   . Depression   . Dementia     mild  . History of CVA (cerebrovascular accident)   . LBBB (left bundle branch block)   . AAA (abdominal aortic aneurysm)     small, last check in 05/2009, due for repeat in 05/2010  . Carotid stenosis 09/2009    40-59% RICA, 0-39% LICA  . LVH (left ventricular hypertrophy) 10/2009    Moderate, EF 55-60%, no regional wall motion abnormalities  . Shellfish allergy     anaphylaxis  . Osteopenia   . Vitamin D deficiency   . Fracture of rib of left side 12/2009  . Asthma 01/27/2011  .  Diastolic CHF 08/02/2011  . AAA (abdominal aortic aneurysm) without rupture 07/16/2013   Past Surgical History  Procedure Laterality Date  . Ankle surgery      Right, by [place] in [place]  . Tracheostomy  2010    anaphylactic episode on vent  . Tubal ligation      reports that she has been smoking.  She does not have any smokeless tobacco history on file. She reports that she does not drink alcohol or use illicit drugs. family history includes Diabetes type II in her other; Hypertension in her brother; Rheum arthritis in her sister; Stroke in her brother. Allergies  Allergen Reactions  . Ibuprofen Anaphylaxis  . Shellfish-Derived Products Anaphylaxis    Swelling tongue and throat  . Ace Inhibitors Other (See Comments)    unknown  . Sulfa Antibiotics Nausea And Vomiting   Current Outpatient Prescriptions on File Prior to Visit  Medication Sig Dispense Refill  . aspirin 81 MG EC tablet Take 81 mg by mouth every morning.       Marland Kitchen. HYDROcodone-acetaminophen (NORCO/VICODIN) 5-325 MG per tablet 1 by mouth every 6 hours as needed for mild to moderate pain **DO NOT EXCEED 4GM OF TYLENOL IN 24 HOURS, take 2 by mouth every 6 hours as needed for moderate to severe pain.  120 tablet  0  . insulin glargine (LANTUS) 100 UNIT/ML injection Inject 0.25 mLs (25 Units total) into the skin at bedtime.  30 mL  3  . Insulin Pen Needle (B-D UF III MINI PEN NEEDLES) 31G X 5 MM MISC Use as directed for insulin.  Diagnosis code 250.00  300 each  3  . ipratropium-albuterol (DUONEB) 0.5-2.5 (3) MG/3ML SOLN Take 3 mLs by nebulization every 6 (six) hours as needed.  360 mL  11  . NIFEdipine (PROCARDIA-XL/ADALAT-CC/NIFEDICAL-XL) 30 MG 24 hr tablet Take 1 tablet (30 mg total) by mouth every morning.  90 tablet  3  . omeprazole (PRILOSEC) 20 MG capsule Take 1 capsule (20 mg total) by mouth daily.  30 capsule  11  . ondansetron (ZOFRAN) 4 MG tablet Take 1 tablet (4 mg total) by mouth every 8 (eight) hours as needed for nausea  or vomiting.  20 tablet  0  . risperiDONE (RISPERDAL) 1 MG tablet Take 1 tablet (1 mg total) by mouth 2 (two) times daily.  180 tablet  1   No current facility-administered medications on file prior to visit.    Review of Systems - per duaghter   Constitutional: Negative for unusual diaphoresis or other sweats  HENT: Negative for ringing in ear Eyes: Negative for double vision or worsening visual disturbance.  Respiratory: Negative for choking and stridor.   Gastrointestinal: Negative for vomiting or other signifcant bowel change Genitourinary: Negative for hematuria or decreased urine volume.  Musculoskeletal: Negative for other MSK pain or swelling Skin: Negative for color change and worsening wound.  Neurological: Negative for tremors and numbness other than noted  Psychiatric/Behavioral: Negative for decreased concentration or agitation other than above       Objective:   Physical Exam BP 120/68  Pulse 67  Temp(Src) 97.9 F (36.6 C) (Oral)  Wt 150 lb (68.04 kg)  SpO2 94% VS noted,  Constitutional: Pt appears well-developed, well-nourished.  HENT: Head: NCAT.  Right Ear: External ear normal.  Left Ear: External ear normal.  Eyes: . Pupils are equal, round, and reactive to light. Conjunctivae and EOM are normal Neck: Normal range of motion. Neck supple.  Cardiovascular: Normal rate and regular rhythm.   Pulmonary/Chest: Effort normal and breath sounds normal.  Abd:  Soft, NT, ND, + BS - benign Neurological: Pt is alert. +confused , motor grossly intact Skin: Skin is warm. No rash Psychiatric: Pt behavior is normal - alert. No agitation.      Assessment & Plan:

## 2013-12-14 NOTE — Progress Notes (Signed)
Pre visit review using our clinic review tool, if applicable. No additional management support is needed unless otherwise documented below in the visit note. 

## 2013-12-14 NOTE — Assessment & Plan Note (Signed)
stable overall by history and exam, recent data reviewed with pt, and pt to continue medical treatment as before,  to f/u any worsening symptoms or concerns  

## 2013-12-15 ENCOUNTER — Telehealth: Payer: Self-pay | Admitting: Internal Medicine

## 2013-12-15 NOTE — Telephone Encounter (Signed)
[facility] MAILED  °

## 2013-12-16 ENCOUNTER — Encounter (HOSPITAL_COMMUNITY): Payer: Self-pay | Admitting: Emergency Medicine

## 2013-12-16 ENCOUNTER — Emergency Department (HOSPITAL_COMMUNITY): Payer: Medicare HMO

## 2013-12-16 ENCOUNTER — Inpatient Hospital Stay (HOSPITAL_COMMUNITY)
Admission: EM | Admit: 2013-12-16 | Discharge: 2013-12-20 | DRG: 190 | Disposition: A | Discharge status: Home Health | Payer: Medicare HMO | Attending: Internal Medicine | Admitting: Internal Medicine

## 2013-12-16 DIAGNOSIS — D72829 Elevated white blood cell count, unspecified: Secondary | ICD-10-CM

## 2013-12-16 DIAGNOSIS — G4734 Idiopathic sleep related nonobstructive alveolar hypoventilation: Secondary | ICD-10-CM

## 2013-12-16 DIAGNOSIS — I10 Essential (primary) hypertension: Secondary | ICD-10-CM | POA: Diagnosis present

## 2013-12-16 DIAGNOSIS — M5431 Sciatica, right side: Secondary | ICD-10-CM

## 2013-12-16 DIAGNOSIS — R0609 Other forms of dyspnea: Secondary | ICD-10-CM

## 2013-12-16 DIAGNOSIS — K219 Gastro-esophageal reflux disease without esophagitis: Secondary | ICD-10-CM | POA: Diagnosis present

## 2013-12-16 DIAGNOSIS — Z87891 Personal history of nicotine dependence: Secondary | ICD-10-CM

## 2013-12-16 DIAGNOSIS — R0602 Shortness of breath: Secondary | ICD-10-CM | POA: Diagnosis not present

## 2013-12-16 DIAGNOSIS — I5033 Acute on chronic diastolic (congestive) heart failure: Secondary | ICD-10-CM | POA: Diagnosis present

## 2013-12-16 DIAGNOSIS — R350 Frequency of micturition: Secondary | ICD-10-CM

## 2013-12-16 DIAGNOSIS — E119 Type 2 diabetes mellitus without complications: Secondary | ICD-10-CM | POA: Diagnosis present

## 2013-12-16 DIAGNOSIS — F0390 Unspecified dementia without behavioral disturbance: Secondary | ICD-10-CM | POA: Diagnosis present

## 2013-12-16 DIAGNOSIS — I714 Abdominal aortic aneurysm, without rupture, unspecified: Secondary | ICD-10-CM

## 2013-12-16 DIAGNOSIS — L03031 Cellulitis of right toe: Secondary | ICD-10-CM

## 2013-12-16 DIAGNOSIS — R296 Repeated falls: Secondary | ICD-10-CM

## 2013-12-16 DIAGNOSIS — J449 Chronic obstructive pulmonary disease, unspecified: Secondary | ICD-10-CM

## 2013-12-16 DIAGNOSIS — Z8673 Personal history of transient ischemic attack (TIA), and cerebral infarction without residual deficits: Secondary | ICD-10-CM

## 2013-12-16 DIAGNOSIS — J189 Pneumonia, unspecified organism: Secondary | ICD-10-CM

## 2013-12-16 DIAGNOSIS — E785 Hyperlipidemia, unspecified: Secondary | ICD-10-CM | POA: Diagnosis present

## 2013-12-16 DIAGNOSIS — Z7982 Long term (current) use of aspirin: Secondary | ICD-10-CM

## 2013-12-16 DIAGNOSIS — J9601 Acute respiratory failure with hypoxia: Secondary | ICD-10-CM

## 2013-12-16 DIAGNOSIS — I447 Left bundle-branch block, unspecified: Secondary | ICD-10-CM | POA: Diagnosis present

## 2013-12-16 DIAGNOSIS — B370 Candidal stomatitis: Secondary | ICD-10-CM

## 2013-12-16 DIAGNOSIS — H269 Unspecified cataract: Secondary | ICD-10-CM | POA: Diagnosis present

## 2013-12-16 DIAGNOSIS — Z66 Do not resuscitate: Secondary | ICD-10-CM | POA: Diagnosis present

## 2013-12-16 DIAGNOSIS — Z79899 Other long term (current) drug therapy: Secondary | ICD-10-CM

## 2013-12-16 DIAGNOSIS — F329 Major depressive disorder, single episode, unspecified: Secondary | ICD-10-CM | POA: Diagnosis present

## 2013-12-16 DIAGNOSIS — Z794 Long term (current) use of insulin: Secondary | ICD-10-CM

## 2013-12-16 DIAGNOSIS — J441 Chronic obstructive pulmonary disease with (acute) exacerbation: Secondary | ICD-10-CM | POA: Diagnosis not present

## 2013-12-16 DIAGNOSIS — R5381 Other malaise: Secondary | ICD-10-CM | POA: Diagnosis present

## 2013-12-16 DIAGNOSIS — I6529 Occlusion and stenosis of unspecified carotid artery: Secondary | ICD-10-CM | POA: Diagnosis present

## 2013-12-16 DIAGNOSIS — R21 Rash and other nonspecific skin eruption: Secondary | ICD-10-CM

## 2013-12-16 DIAGNOSIS — F03918 Unspecified dementia, unspecified severity, with other behavioral disturbance: Secondary | ICD-10-CM

## 2013-12-16 DIAGNOSIS — I5030 Unspecified diastolic (congestive) heart failure: Secondary | ICD-10-CM | POA: Diagnosis present

## 2013-12-16 DIAGNOSIS — R531 Weakness: Secondary | ICD-10-CM

## 2013-12-16 DIAGNOSIS — F0391 Unspecified dementia with behavioral disturbance: Secondary | ICD-10-CM

## 2013-12-16 HISTORY — DX: Unspecified cataract: H26.9

## 2013-12-16 LAB — BASIC METABOLIC PANEL
Anion gap: 17 — ABNORMAL HIGH (ref 5–15)
BUN: 9 mg/dL (ref 6–23)
CALCIUM: 9.1 mg/dL (ref 8.4–10.5)
CO2: 20 mEq/L (ref 19–32)
Chloride: 101 mEq/L (ref 96–112)
Creatinine, Ser: 0.69 mg/dL (ref 0.50–1.10)
GFR, EST NON AFRICAN AMERICAN: 79 mL/min — AB (ref >90)
Glucose, Bld: 97 mg/dL (ref 70–99)
Potassium: 3.5 mEq/L — ABNORMAL LOW (ref 3.7–5.3)
Sodium: 138 mEq/L (ref 137–147)

## 2013-12-16 LAB — CBC WITH DIFFERENTIAL/PLATELET
BASOS ABS: 0 10*3/uL (ref 0.0–0.1)
BASOS PCT: 1 % (ref 0–1)
EOS ABS: 0.3 10*3/uL (ref 0.0–0.7)
Eosinophils Relative: 4 % (ref 0–5)
HEMATOCRIT: 36.7 % (ref 36.0–46.0)
HEMOGLOBIN: 12.4 g/dL (ref 12.0–15.0)
Lymphocytes Relative: 19 % (ref 12–46)
Lymphs Abs: 1.6 10*3/uL (ref 0.7–4.0)
MCH: 29.1 pg (ref 26.0–34.0)
MCHC: 33.8 g/dL (ref 30.0–36.0)
MCV: 86.2 fL (ref 78.0–100.0)
MONOS PCT: 9 % (ref 3–12)
Monocytes Absolute: 0.7 10*3/uL (ref 0.1–1.0)
Neutro Abs: 5.5 10*3/uL (ref 1.7–7.7)
Neutrophils Relative %: 67 % (ref 43–77)
Platelets: 316 10*3/uL (ref 150–400)
RBC: 4.26 MIL/uL (ref 3.87–5.11)
RDW: 13.3 % (ref 11.5–15.5)
WBC: 8.1 10*3/uL (ref 4.0–10.5)

## 2013-12-16 LAB — PRO B NATRIURETIC PEPTIDE: Pro B Natriuretic peptide (BNP): 639 pg/mL — ABNORMAL HIGH (ref 0–450)

## 2013-12-16 LAB — CBG MONITORING, ED: Glucose-Capillary: 88 mg/dL (ref 70–99)

## 2013-12-16 LAB — I-STAT TROPONIN, ED: Troponin i, poc: 0.03 ng/mL (ref 0.00–0.08)

## 2013-12-16 MED ORDER — ALBUTEROL SULFATE (2.5 MG/3ML) 0.083% IN NEBU
5.0000 mg | INHALATION_SOLUTION | Freq: Once | RESPIRATORY_TRACT | Status: AC
  Administered 2013-12-16: 5 mg via RESPIRATORY_TRACT
  Filled 2013-12-16: qty 6

## 2013-12-16 MED ORDER — IPRATROPIUM BROMIDE 0.02 % IN SOLN
0.5000 mg | Freq: Once | RESPIRATORY_TRACT | Status: AC
  Administered 2013-12-16: 0.5 mg via RESPIRATORY_TRACT
  Filled 2013-12-16: qty 2.5

## 2013-12-16 MED ORDER — PREDNISONE 20 MG PO TABS
60.0000 mg | ORAL_TABLET | Freq: Once | ORAL | Status: AC
  Administered 2013-12-16: 60 mg via ORAL
  Filled 2013-12-16: qty 3

## 2013-12-16 NOTE — ED Notes (Signed)
Patient will be relaxed and at times become tachypnic without dropping spo2.

## 2013-12-16 NOTE — ED Notes (Addendum)
Ambulated patient down the hall (approx. 60 feet) and patient's oxygen level quickly dropped to 76 % room air.  Heart rate climbed to 117 BPM.

## 2013-12-16 NOTE — Progress Notes (Signed)
Report received from Kim,RN.

## 2013-12-16 NOTE — ED Notes (Signed)
Family at bedside. 

## 2013-12-16 NOTE — ED Provider Notes (Signed)
CSN: [id number]     Arrival date & time 12/16/13  1902 History   First MD Initiated Contact with Patient 12/16/13 1925     Chief Complaint  Patient presents with  . Respiratory Distress      Patient is a 80 y.o. female presenting with shortness of breath. The history is provided by the patient and a relative.  Shortness of Breath Severity:  Moderate Onset quality:  Gradual Timing:  Constant Progression:  Worsening Chronicity:  New Relieved by: nebulizer. Worsened by:  Nothing tried Associated symptoms: cough   Associated symptoms: no chest pain, no fever, no hemoptysis and no vomiting   pt presents from home for SOB Per daughter pt has had increasing SOB today.  She has been given nebulizer at home with some improvement She uses oxygen at home [facility](2L[facility]) She denies any CP at this time   Past Medical History  Diagnosis Date  . Diabetes mellitus type II   . Hyperlipidemia   . HTN (hypertension)   . GERD (gastroesophageal reflux disease)   . Allergic rhinitis   . Depression   . Dementia     mild  . History of CVA (cerebrovascular accident)   . LBBB (left bundle branch block)   . AAA (abdominal aortic aneurysm)     small, last check in 05/2009, due for repeat in 05/2010  . Carotid stenosis 09/2009    40-59% RICA, 0-39% LICA  . LVH (left ventricular hypertrophy) 10/2009    Moderate, EF 55-60%, no regional wall motion abnormalities  . Shellfish allergy     anaphylaxis  . Osteopenia   . Vitamin D deficiency   . Fracture of rib of left side 12/2009  . Asthma 01/27/2011  . Diastolic CHF 08/02/2011  . AAA (abdominal aortic aneurysm) without rupture 07/16/2013  . Cataract    Past Surgical History  Procedure Laterality Date  . Ankle surgery      Right, by Ortho in [place]  . Tracheostomy  2010    anaphylactic episode on vent  . Tubal ligation     Family History  Problem Relation Age of Onset  . Rheum arthritis Sister   . Stroke Brother   . Hypertension Brother   .  Diabetes type II Other     2 siblings   History  Substance Use Topics  . Smoking status: Former Smoker    Quit date: 12/17/2010  . Smokeless tobacco: Not on file     Comment: 3/4 ppd  . Alcohol Use: No   OB History   Grav Para Term Preterm Abortions TAB SAB Ect Mult Living                 Review of Systems  Constitutional: Negative for fever.  Respiratory: Positive for cough and shortness of breath. Negative for hemoptysis.   Cardiovascular: Negative for chest pain.  Gastrointestinal: Negative for vomiting.  All other systems reviewed and are negative.     Allergies  Ibuprofen; Shellfish-derived products; Ace inhibitors; and Sulfa antibiotics  Home Medications   Prior to Admission medications   Medication Sig Start Date End Date Taking? Authorizing Provider  aspirin 81 MG EC tablet Take 81 mg by mouth every morning.    Yes Historical Provider, MD  cephALEXin (KEFLEX) 500 MG capsule Take 1 capsule (500 mg total) by mouth 4 (four) times daily. 12/14/13  Yes Corwin LevinsJames W John, MD  citalopram (CELEXA) 20 MG tablet Take 1 tablet (20 mg total) by mouth every morning. 12/14/13  Yes Corwin Levins, MD  donepezil (ARICEPT) 10 MG tablet Take 1 tablet (10 mg total) by mouth daily. 12/14/13  Yes Corwin Levins, MD  insulin glargine (LANTUS) 100 UNIT/ML injection Inject 0.25 mLs (25 Units total) into the skin at bedtime. 03/25/13  Yes Corwin Levins, MD  ipratropium-albuterol (DUONEB) 0.5-2.5 (3) MG/3ML SOLN Take 3 mLs by nebulization every 6 (six) hours as needed (shortness of breath).   Yes Historical Provider, MD  labetalol (NORMODYNE) 200 MG tablet Take 1 tablet (200 mg total) by mouth 2 (two) times daily. 12/14/13  Yes Corwin Levins, MD  loperamide (IMODIUM A-D) 2 MG tablet Take 2 mg by mouth as needed for diarrhea or loose stools.   Yes Historical Provider, MD  montelukast (SINGULAIR) 10 MG tablet Take 1 tablet (10 mg total) by mouth at bedtime. 12/14/13  Yes Corwin Levins, MD  NIFEdipine  (PROCARDIA-XL/ADALAT-CC/NIFEDICAL-XL) 30 MG 24 hr tablet Take 1 tablet (30 mg total) by mouth every morning. 03/25/13  Yes Corwin Levins, MD  omeprazole (PRILOSEC) 20 MG capsule Take 1 capsule (20 mg total) by mouth daily. 04/09/13  Yes Corwin Levins, MD  risperiDONE (RISPERDAL) 1 MG tablet Take 1 tablet (1 mg total) by mouth 2 (two) times daily. 12/14/13  Yes Corwin Levins, MD  Insulin Pen Needle (B-D UF III MINI PEN NEEDLES) 31G X 5 MM MISC Use as directed for insulin.  Diagnosis code 250.00 04/01/13   Corwin Levins, MD   BP 162/65  Pulse 73  Temp(Src) 98.2 F (36.8 C) (Oral)  Resp 18  Ht 5\' 6"  (1.676 m)  Wt 150 lb (68.04 kg)  BMI 24.22 kg/m2  SpO2 100% Physical Exam CONSTITUTIONAL: Well developed/well nourished HEAD: Normocephalic/atraumatic EYES: EOMI/PERRL ENMT: Mucous membranes moist NECK: supple no meningeal signs SPINE:entire spine nontender CV: S1/S2 noted, no murmurs/rubs/gallops noted LUNGS: scattered wheezing noted no distress noted ABDOMEN: soft, nontender, no rebound or guarding GU:no cva tenderness NEURO: Pt is awake/alert, moves all extremitiesx4 EXTREMITIES: pulses normal, full ROM SKIN: warm, color normal PSYCH: no abnormalities of mood noted  ED Course  Procedures  10:49 PM Pt with h/o COPD, presenting with increased SOB.  On initial exam she was improved,no distress but during ED stay she had return of wheezing.  She was ambulated (she ambulates with walker at baseline) and became hypoxic/tachypneic.  She did not report chest pain CXR not c/w CHf I doubt ACS/PE at this time.  Suspect COPD exacerbation Will admit D/w dr Toniann Fail will admit   Labs Review Labs Reviewed  BASIC METABOLIC PANEL - Abnormal; Notable for the following:    Potassium 3.5 (*)    GFR calc non Af Amer 79 (*)    Anion gap 17 (*)    All other components within normal limits  PRO B NATRIURETIC PEPTIDE - Abnormal; Notable for the following:    Pro B Natriuretic peptide (BNP) 639.0 (*)     All other components within normal limits  CBC WITH DIFFERENTIAL  I-STAT TROPOININ, ED  CBG MONITORING, ED    Imaging Review Dg Chest Portable 1 View  12/16/2013   CLINICAL DATA:  Bradycardia, shortness of breath. Abdominal pain. History of asthma.  EXAM: PORTABLE CHEST - 1 VIEW  COMPARISON:  Chest radiograph December 27, 2012  FINDINGS: Cardiac silhouette appears unremarkable. Mildly calcified aortic knob. Mild chronic interstitial changes and increased lung volumes. No pleural effusions focal consolidations. No pneumothorax. Skin fold RIGHT midlung zone.  Soft tissue planes and  included osseous structures are nonsuspicious; mild scoliosis.  IMPRESSION: Suspected COPD without superimposed acute cardiopulmonary process.   Electronically Signed   By: Awilda Metroourtnay  Bloomer   On: 12/16/2013 20:50     Date: 12/16/2013  Rate: 74  Rhythm: normal sinus rhythm  QRS Axis: normal  Intervals: normal  ST/T Wave abnormalities: nonspecific ST changes  Conduction Disutrbances:nonspecific intraventricular conduction delay  Narrative Interpretation:   Old EKG Reviewed: unchanged Artifact noted which limits evaluation   MDM   Final diagnoses:  COPD exacerbation  Dyspnea on exertion    Nursing notes including past medical history and social history reviewed and considered in documentation Labs/vital reviewed and considered     Joya Gaskinsonald W Leeya Rusconi, MD 12/16/13 2253

## 2013-12-16 NOTE — ED Notes (Signed)
Patient arrived via [facility] from home with shortness of breath respiratory rate was 46 with ems. Patient has dementia and daughter is at bedside states she was complaining of her stomach aching earlier but patient denies because she does not want to be here. No medications given by EMS. Neb treatments at home prior to EMS x2 without change. Patient has good bilateral breath sounds.

## 2013-12-17 ENCOUNTER — Encounter (HOSPITAL_COMMUNITY): Payer: Self-pay | Admitting: Internal Medicine

## 2013-12-17 DIAGNOSIS — I10 Essential (primary) hypertension: Secondary | ICD-10-CM | POA: Diagnosis present

## 2013-12-17 DIAGNOSIS — K219 Gastro-esophageal reflux disease without esophagitis: Secondary | ICD-10-CM | POA: Diagnosis present

## 2013-12-17 DIAGNOSIS — Z79899 Other long term (current) drug therapy: Secondary | ICD-10-CM | POA: Diagnosis not present

## 2013-12-17 DIAGNOSIS — I447 Left bundle-branch block, unspecified: Secondary | ICD-10-CM | POA: Diagnosis present

## 2013-12-17 DIAGNOSIS — Z87891 Personal history of nicotine dependence: Secondary | ICD-10-CM | POA: Diagnosis not present

## 2013-12-17 DIAGNOSIS — J441 Chronic obstructive pulmonary disease with (acute) exacerbation: Principal | ICD-10-CM

## 2013-12-17 DIAGNOSIS — Z794 Long term (current) use of insulin: Secondary | ICD-10-CM | POA: Diagnosis not present

## 2013-12-17 DIAGNOSIS — E785 Hyperlipidemia, unspecified: Secondary | ICD-10-CM | POA: Diagnosis present

## 2013-12-17 DIAGNOSIS — Z8673 Personal history of transient ischemic attack (TIA), and cerebral infarction without residual deficits: Secondary | ICD-10-CM | POA: Diagnosis not present

## 2013-12-17 DIAGNOSIS — F0390 Unspecified dementia without behavioral disturbance: Secondary | ICD-10-CM | POA: Diagnosis present

## 2013-12-17 DIAGNOSIS — Z7982 Long term (current) use of aspirin: Secondary | ICD-10-CM | POA: Diagnosis not present

## 2013-12-17 DIAGNOSIS — I5030 Unspecified diastolic (congestive) heart failure: Secondary | ICD-10-CM

## 2013-12-17 DIAGNOSIS — Z66 Do not resuscitate: Secondary | ICD-10-CM | POA: Diagnosis present

## 2013-12-17 DIAGNOSIS — F329 Major depressive disorder, single episode, unspecified: Secondary | ICD-10-CM | POA: Diagnosis present

## 2013-12-17 DIAGNOSIS — E119 Type 2 diabetes mellitus without complications: Secondary | ICD-10-CM

## 2013-12-17 DIAGNOSIS — I714 Abdominal aortic aneurysm, without rupture: Secondary | ICD-10-CM | POA: Diagnosis present

## 2013-12-17 DIAGNOSIS — H269 Unspecified cataract: Secondary | ICD-10-CM | POA: Diagnosis present

## 2013-12-17 DIAGNOSIS — I5033 Acute on chronic diastolic (congestive) heart failure: Secondary | ICD-10-CM | POA: Diagnosis present

## 2013-12-17 DIAGNOSIS — I6529 Occlusion and stenosis of unspecified carotid artery: Secondary | ICD-10-CM | POA: Diagnosis present

## 2013-12-17 DIAGNOSIS — R0602 Shortness of breath: Secondary | ICD-10-CM | POA: Diagnosis present

## 2013-12-17 DIAGNOSIS — R5381 Other malaise: Secondary | ICD-10-CM | POA: Diagnosis present

## 2013-12-17 LAB — CBC WITH DIFFERENTIAL/PLATELET
BASOS ABS: 0 10*3/uL (ref 0.0–0.1)
BASOS PCT: 0 % (ref 0–1)
Eosinophils Absolute: 0 10*3/uL (ref 0.0–0.7)
Eosinophils Relative: 1 % (ref 0–5)
HCT: 37.6 % (ref 36.0–46.0)
Hemoglobin: 12.9 g/dL (ref 12.0–15.0)
Lymphocytes Relative: 14 % (ref 12–46)
Lymphs Abs: 1.2 10*3/uL (ref 0.7–4.0)
MCH: 29.9 pg (ref 26.0–34.0)
MCHC: 34.3 g/dL (ref 30.0–36.0)
MCV: 87 fL (ref 78.0–100.0)
Monocytes Absolute: 0.2 10*3/uL (ref 0.1–1.0)
Monocytes Relative: 2 % — ABNORMAL LOW (ref 3–12)
NEUTROS ABS: 7.4 10*3/uL (ref 1.7–7.7)
NEUTROS PCT: 83 % — AB (ref 43–77)
Platelets: 350 10*3/uL (ref 150–400)
RBC: 4.32 MIL/uL (ref 3.87–5.11)
RDW: 13.2 % (ref 11.5–15.5)
WBC: 8.9 10*3/uL (ref 4.0–10.5)

## 2013-12-17 LAB — COMPREHENSIVE METABOLIC PANEL
ALT: 6 U/L (ref 0–35)
ANION GAP: 18 — AB (ref 5–15)
AST: 13 U/L (ref 0–37)
Albumin: 3.5 g/dL (ref 3.5–5.2)
Alkaline Phosphatase: 94 U/L (ref 39–117)
BILIRUBIN TOTAL: 0.6 mg/dL (ref 0.3–1.2)
BUN: 10 mg/dL (ref 6–23)
CHLORIDE: 98 meq/L (ref 96–112)
CO2: 19 mEq/L (ref 19–32)
Calcium: 9.2 mg/dL (ref 8.4–10.5)
Creatinine, Ser: 0.74 mg/dL (ref 0.50–1.10)
GFR calc Af Amer: >90 mL/min (ref >90)
GFR calc non Af Amer: 78 mL/min — ABNORMAL LOW (ref >90)
Glucose, Bld: 167 mg/dL — ABNORMAL HIGH (ref 70–99)
Potassium: 3.5 mEq/L — ABNORMAL LOW (ref 3.7–5.3)
Sodium: 135 mEq/L — ABNORMAL LOW (ref 137–147)
TOTAL PROTEIN: 8 g/dL (ref 6.0–8.3)

## 2013-12-17 LAB — GLUCOSE, CAPILLARY
GLUCOSE-CAPILLARY: 108 mg/dL — AB (ref 70–99)
GLUCOSE-CAPILLARY: 157 mg/dL — AB (ref 70–99)
GLUCOSE-CAPILLARY: 161 mg/dL — AB (ref 70–99)
Glucose-Capillary: 207 mg/dL — ABNORMAL HIGH (ref 70–99)
Glucose-Capillary: 153 mg/dL — ABNORMAL HIGH (ref 70–99)

## 2013-12-17 LAB — D-DIMER, QUANTITATIVE (NOT AT ARMC): D DIMER QUANT: 1.53 ug{FEU}/mL — AB (ref 0.00–0.48)

## 2013-12-17 LAB — CLOSTRIDIUM DIFFICILE BY PCR: Toxigenic C. Difficile by PCR: NEGATIVE

## 2013-12-17 MED ORDER — POTASSIUM CHLORIDE CRYS ER 20 MEQ PO TBCR
20.0000 meq | EXTENDED_RELEASE_TABLET | Freq: Once | ORAL | Status: AC
  Administered 2013-12-17: 20 meq via ORAL
  Filled 2013-12-17: qty 1

## 2013-12-17 MED ORDER — CITALOPRAM HYDROBROMIDE 20 MG PO TABS
20.0000 mg | ORAL_TABLET | Freq: Every morning | ORAL | Status: DC
  Administered 2013-12-17 – 2013-12-20 (×4): 20 mg via ORAL
  Filled 2013-12-17 (×4): qty 1

## 2013-12-17 MED ORDER — SODIUM CHLORIDE 0.9 % IJ SOLN
3.0000 mL | Freq: Two times a day (BID) | INTRAMUSCULAR | Status: DC
  Administered 2013-12-17 – 2013-12-20 (×7): 3 mL via INTRAVENOUS

## 2013-12-17 MED ORDER — INSULIN GLARGINE 100 UNIT/ML ~~LOC~~ SOLN
25.0000 [IU] | Freq: Every day | SUBCUTANEOUS | Status: DC
  Administered 2013-12-17 – 2013-12-19 (×4): 25 [IU] via SUBCUTANEOUS
  Filled 2013-12-17 (×5): qty 0.25

## 2013-12-17 MED ORDER — FUROSEMIDE 10 MG/ML IJ SOLN
20.0000 mg | Freq: Once | INTRAMUSCULAR | Status: AC
  Administered 2013-12-17: 20 mg via INTRAVENOUS
  Filled 2013-12-17: qty 2

## 2013-12-17 MED ORDER — ASPIRIN EC 81 MG PO TBEC
81.0000 mg | DELAYED_RELEASE_TABLET | Freq: Every morning | ORAL | Status: DC
  Administered 2013-12-17 – 2013-12-20 (×4): 81 mg via ORAL
  Filled 2013-12-17 (×4): qty 1

## 2013-12-17 MED ORDER — LABETALOL HCL 200 MG PO TABS
200.0000 mg | ORAL_TABLET | Freq: Two times a day (BID) | ORAL | Status: DC
  Administered 2013-12-17 – 2013-12-20 (×8): 200 mg via ORAL
  Filled 2013-12-17 (×9): qty 1

## 2013-12-17 MED ORDER — SODIUM CHLORIDE 0.9 % IJ SOLN: 3.0000 mL | Freq: Two times a day (BID) | INTRAMUSCULAR | Status: DC

## 2013-12-17 MED ORDER — INSULIN ASPART 100 UNIT/ML ~~LOC~~ SOLN
0.0000 [IU] | Freq: Three times a day (TID) | SUBCUTANEOUS | Status: DC
  Administered 2013-12-17: 2 [IU] via SUBCUTANEOUS
  Administered 2013-12-17: 3 [IU] via SUBCUTANEOUS
  Administered 2013-12-18: 2 [IU] via SUBCUTANEOUS
  Administered 2013-12-18 – 2013-12-20 (×2): 1 [IU] via SUBCUTANEOUS

## 2013-12-17 MED ORDER — DONEPEZIL HCL 10 MG PO TABS
10.0000 mg | ORAL_TABLET | Freq: Every day | ORAL | Status: DC
  Administered 2013-12-17 – 2013-12-20 (×4): 10 mg via ORAL
  Filled 2013-12-17 (×4): qty 1

## 2013-12-17 MED ORDER — RISPERIDONE 1 MG PO TABS
1.0000 mg | ORAL_TABLET | Freq: Two times a day (BID) | ORAL | Status: DC
  Administered 2013-12-17 – 2013-12-20 (×8): 1 mg via ORAL
  Filled 2013-12-17 (×9): qty 1

## 2013-12-17 MED ORDER — MONTELUKAST SODIUM 10 MG PO TABS
10.0000 mg | ORAL_TABLET | Freq: Every day | ORAL | Status: DC
  Administered 2013-12-17 – 2013-12-19 (×3): 10 mg via ORAL
  Filled 2013-12-17 (×4): qty 1

## 2013-12-17 MED ORDER — BUDESONIDE 0.25 MG/2ML IN SUSP
0.2500 mg | Freq: Two times a day (BID) | RESPIRATORY_TRACT | Status: DC
  Administered 2013-12-17 – 2013-12-20 (×6): 0.25 mg via RESPIRATORY_TRACT
  Filled 2013-12-17 (×9): qty 2

## 2013-12-17 MED ORDER — ACETAMINOPHEN 650 MG RE SUPP: 650.0000 mg | Freq: Four times a day (QID) | RECTAL | Status: DC | PRN

## 2013-12-17 MED ORDER — NIFEDIPINE ER 30 MG PO TB24
30.0000 mg | ORAL_TABLET | Freq: Every morning | ORAL | Status: DC
  Administered 2013-12-17 – 2013-12-20 (×4): 30 mg via ORAL
  Filled 2013-12-17 (×4): qty 1

## 2013-12-17 MED ORDER — ONDANSETRON HCL 4 MG PO TABS
4.0000 mg | ORAL_TABLET | Freq: Four times a day (QID) | ORAL | Status: DC | PRN
Start: 2013-12-17 — End: 2013-12-20

## 2013-12-17 MED ORDER — ONDANSETRON HCL 4 MG/2ML IJ SOLN: 4.0000 mg | Freq: Four times a day (QID) | INTRAMUSCULAR | Status: DC | PRN

## 2013-12-17 MED ORDER — ACETAMINOPHEN 325 MG PO TABS: 650.0000 mg | ORAL_TABLET | Freq: Four times a day (QID) | ORAL | Status: DC | PRN

## 2013-12-17 MED ORDER — ENOXAPARIN SODIUM 40 MG/0.4ML ~~LOC~~ SOLN
40.0000 mg | SUBCUTANEOUS | Status: DC
  Administered 2013-12-17 – 2013-12-20 (×4): 40 mg via SUBCUTANEOUS
  Filled 2013-12-17 (×4): qty 0.4

## 2013-12-17 MED ORDER — PANTOPRAZOLE SODIUM 40 MG PO TBEC
40.0000 mg | DELAYED_RELEASE_TABLET | Freq: Every day | ORAL | Status: DC
  Administered 2013-12-17 – 2013-12-20 (×4): 40 mg via ORAL
  Filled 2013-12-17 (×2): qty 1

## 2013-12-17 MED ORDER — CETYLPYRIDINIUM CHLORIDE 0.05 % MT LIQD
7.0000 mL | Freq: Two times a day (BID) | OROMUCOSAL | Status: DC
  Administered 2013-12-17 – 2013-12-20 (×7): 7 mL via OROMUCOSAL

## 2013-12-17 NOTE — H&P (Signed)
[facility] History and Physical  Alicia Kent ZOX:[id number] DOB: December 23, 1930 DOA: 12/16/2013  Referring physician: ER physician. PCP: Oliver BarreJames John, MD   History obtained from patient's daughter. Patient has dementia.  Chief Complaint: Shortness of breath.  HPI: Alicia Kent is a 80 y.o. female with history of COPD, hypertension, diastolic CHF last EF measured in 2013 was 60-65%, chronic LBBB, diabetes mellitus type 2 was brought to the ER after patient's daughter found that patient was getting increasingly short of breath yesterday. Patient has been having some wheezing and cough but no fever chills or chest pain. Initially patient also was mildly diaphoretic. Blood sugar was around 80. In the ER patient was found to be wheezing and on exertion patient was getting easily hypoxic. Patient has been admitted for shortness of breath most likely from COPD with possible CHF also. Patient did not have any nausea vomiting abdominal pain diarrhea.   Review of Systems: As presented in the history of presenting illness, rest negative.  Past Medical History  Diagnosis Date  . Diabetes mellitus type II   . Hyperlipidemia   . HTN (hypertension)   . GERD (gastroesophageal reflux disease)   . Allergic rhinitis   . Depression   . Dementia     mild  . History of CVA (cerebrovascular accident)   . LBBB (left bundle branch block)   . AAA (abdominal aortic aneurysm)     small, last check in 05/2009, due for repeat in 05/2010  . Carotid stenosis 09/2009    40-59% RICA, 0-39% LICA  . LVH (left ventricular hypertrophy) 10/2009    Moderate, EF 55-60%, no regional wall motion abnormalities  . Shellfish allergy     anaphylaxis  . Osteopenia   . Vitamin D deficiency   . Fracture of rib of left side 12/2009  . Asthma 01/27/2011  . Diastolic CHF 08/02/2011  . AAA (abdominal aortic aneurysm) without rupture 07/16/2013  . Cataract    Past Surgical History  Procedure Laterality Date  . Ankle surgery       Right, by Ortho in [place]  . Tracheostomy  2010    anaphylactic episode on vent  . Tubal ligation     Social History:  reports that she quit smoking about 3 years ago. She does not have any smokeless tobacco history on file. She reports that she does not drink alcohol or use illicit drugs. Where does patient live home. Can patient participate in ADLs? No.  Allergies  Allergen Reactions  . Ibuprofen Anaphylaxis  . Shellfish-Derived Products Anaphylaxis    Swelling tongue and throat  . Ace Inhibitors Other (See Comments)    unknown  . Sulfa Antibiotics Nausea And Vomiting    Family History:  Family History  Problem Relation Age of Onset  . Rheum arthritis Sister   . Stroke Brother   . Hypertension Brother   . Diabetes type II Other     2 siblings      Prior to Admission medications   Medication Sig Start Date End Date Taking? Authorizing Provider  aspirin 81 MG EC tablet Take 81 mg by mouth every morning.    Yes Historical Provider, MD  cephALEXin (KEFLEX) 500 MG capsule Take 1 capsule (500 mg total) by mouth 4 (four) times daily. 12/14/13  Yes Corwin LevinsJames W John, MD  citalopram (CELEXA) 20 MG tablet Take 1 tablet (20 mg total) by mouth every morning. 12/14/13  Yes Corwin LevinsJames W John, MD  donepezil (ARICEPT) 10 MG tablet Take 1  tablet (10 mg total) by mouth daily. 12/14/13  Yes Corwin Levins, MD  insulin glargine (LANTUS) 100 UNIT/ML injection Inject 0.25 mLs (25 Units total) into the skin at bedtime. 03/25/13  Yes Corwin Levins, MD  ipratropium-albuterol (DUONEB) 0.5-2.5 (3) MG/3ML SOLN Take 3 mLs by nebulization every 6 (six) hours as needed (shortness of breath).   Yes Historical Provider, MD  labetalol (NORMODYNE) 200 MG tablet Take 1 tablet (200 mg total) by mouth 2 (two) times daily. 12/14/13  Yes Corwin Levins, MD  loperamide (IMODIUM A-D) 2 MG tablet Take 2 mg by mouth as needed for diarrhea or loose stools.   Yes Historical Provider, MD  montelukast (SINGULAIR) 10 MG tablet Take 1  tablet (10 mg total) by mouth at bedtime. 12/14/13  Yes Corwin Levins, MD  NIFEdipine (PROCARDIA-XL/ADALAT-CC/NIFEDICAL-XL) 30 MG 24 hr tablet Take 1 tablet (30 mg total) by mouth every morning. 03/25/13  Yes Corwin Levins, MD  omeprazole (PRILOSEC) 20 MG capsule Take 1 capsule (20 mg total) by mouth daily. 04/09/13  Yes Corwin Levins, MD  risperiDONE (RISPERDAL) 1 MG tablet Take 1 tablet (1 mg total) by mouth 2 (two) times daily. 12/14/13  Yes Corwin Levins, MD  Insulin Pen Needle (B-D UF III MINI PEN NEEDLES) 31G X 5 MM MISC Use as directed for insulin.  Diagnosis code 250.00 04/01/13   Corwin Levins, MD    Physical Exam: Filed Vitals:   12/16/13 2100 12/16/13 2115 12/16/13 2200 12/16/13 2315  BP: 158/66 131/108 139/61 157/93  Pulse: 81 73  88  Temp:      TempSrc:      Resp: 21 25  17   Height:      Weight:      SpO2: 100% 100%  99%     General:  Well-developed well-nourished.  Eyes: Anicteric no pallor.  ENT: No discharge from ears eyes nose mouth.  Neck: No mass felt. JVD not appreciated.  Cardiovascular: S1-S2 heard.  Respiratory: No rhonchi or crepitations.  Abdomen: Soft nontender bowel sounds present. No guarding or rigidity.  Skin: No rash.  Musculoskeletal: No edema.  Psychiatric: Patient has dementia.  Neurologic: Alert awake oriented to her name follows commands and moves all extremities.  Labs on Admission:  Basic Metabolic Panel:  Recent Labs Lab 12/16/13 1943  NA 138  K 3.5*  CL 101  CO2 20  GLUCOSE 97  BUN 9  CREATININE 0.69  CALCIUM 9.1   Liver Function Tests: No results found for this basename: AST, ALT, ALKPHOS, BILITOT, PROT, ALBUMIN,  in the last 168 hours No results found for this basename: LIPASE, AMYLASE,  in the last 168 hours No results found for this basename: AMMONIA,  in the last 168 hours CBC:  Recent Labs Lab 12/16/13 1943  WBC 8.1  NEUTROABS 5.5  HGB 12.4  HCT 36.7  MCV 86.2  PLT 316   Cardiac Enzymes: No results found  for this basename: CKTOTAL, CKMB, CKMBINDEX, TROPONINI,  in the last 168 hours  BNP (last 3 results)  Recent Labs  12/16/13 1943  PROBNP 639.0*   CBG:  Recent Labs Lab 12/16/13 2212  GLUCAP 88    Radiological Exams on Admission: Dg Chest Portable 1 View  12/16/2013   CLINICAL DATA:  Bradycardia, shortness of breath. Abdominal pain. History of asthma.  EXAM: PORTABLE CHEST - 1 VIEW  COMPARISON:  Chest radiograph December 27, 2012  FINDINGS: Cardiac silhouette appears unremarkable. Mildly calcified aortic knob. Mild  chronic interstitial changes and increased lung volumes. No pleural effusions focal consolidations. No pneumothorax. Skin fold RIGHT midlung zone.  Soft tissue planes and included osseous structures are nonsuspicious; mild scoliosis.  IMPRESSION: Suspected COPD without superimposed acute cardiopulmonary process.   Electronically Signed   By: Awilda Metroourtnay  Bloomer   On: 12/16/2013 20:50    EKG: Independently reviewed. Normal sinus rhythm with LBBB which is chronic.  Assessment/Plan Principal Problem:   Shortness of breath Active Problems:   Diastolic CHF   COPD exacerbation   Diabetes mellitus type 2, controlled   1. Shortness of breath - probably a combination of COPD and CHF. I have ordered Lasix 20 mg IV and continue with nebulizer and Pulmicort. Check 2-D echo. Closely follow intake output and metabolic panel and daily weights. 2. Hypertension - continue home medications. 3. Diabetes mellitus type 2 - continue home medications. 4. Dementia - continue home medications. 5. Depression - continue home medications.   Code Status: DO NOT RESUSCITATE. Family Communication: Patient's daughter at the bedside.  Disposition Plan: Admit to inpatient.    Kermitt Harjo N. [facility] Pager [phone].  If 7PM-7AM, please contact night-coverage [email or web address] Password [email or web address] 12/17/2013, 12:07 AM

## 2013-12-17 NOTE — Plan of Care (Signed)
Problem: Problem: Cardiovascular Progression Goal: NO EVIDENCE OF VOLUME OVERLOAD Outcome: Progressing Patient incontinent, but having frequent urination. Monitoring strict I&0s.

## 2013-12-17 NOTE — Progress Notes (Signed)
Utilization review completed. Stana Bayon, RN, BSN. 

## 2013-12-17 NOTE — Progress Notes (Signed)
[facility] PROGRESS NOTE  Alicia Kent ZOX:[id number] DOB: 1931/02/20 DOA: 12/16/2013 PCP: Oliver BarreJames John, MD  Assessment/Plan: Principal Problem:   Shortness of breath Active Problems:   Diastolic CHF   COPD exacerbation   Diabetes mellitus type 2, controlled   1. Shortness of breath - probably a combination of COPD and CHF. continue  Lasix 20 mg IV and continue with nebulizer and Pulmicort. Check 2-D echo. Closely follow intake output and metabolic panel and daily weights. Also started the patient on prednisone for COPD exacerbation, chest x-ray shows suspected COPD without superimposed processes 2. Hypertension - continue Procardia, IV Lasix. 3. Diabetes mellitus type 2 - continue home medications. Sliding scale insulin, Lantus 4. Dementia - continue home medications. 5. Depression - continue home medications. 6.   Code Status: DO NOT RESUSCITATE Family Communication: family updated about patient's clinical progress Disposition Plan:  PT/OT eval   Brief narrative: Alicia BoucheMary A Kent is a 80 y.o. female with history of COPD, hypertension, diastolic CHF last EF measured in 2013 was 60-65%, chronic LBBB, diabetes mellitus type 2 was brought to the ER after patient's daughter found that patient was getting increasingly short of breath yesterday. Patient has been having some wheezing and cough but no fever chills or chest pain. Initially patient also was mildly diaphoretic. Blood sugar was around 80. In the ER patient was found to be wheezing and on exertion patient was getting easily hypoxic. Patient has been admitted for shortness of breath most likely from COPD with possible CHF also. Patient did not have any nausea vomiting abdominal pain diarrhea.    Consultants:  None  Procedures:  None  Antibiotics:  None  HPI/Subjective: Subjectively wheezing, appears to be short of breath but she denies being short of breath  Objective: Filed Vitals:   12/17/13 0027 12/17/13 0102  12/17/13 0650 12/17/13 0952  BP: 152/69  124/55 138/71  Pulse: 79  76 64  Temp: 97.6 F (36.4 C)  97.6 F (36.4 C)   TempSrc: Oral  Oral   Resp: 28 20 18    Height:  5\' 5"  (1.651 m)    Weight:  69.6 kg (153 lb 7 oz) 69.6 kg (153 lb 7 oz)   SpO2: 100%  100% 92%    Intake/Output Summary (Last 24 hours) at 12/17/13 1039 Last data filed at 12/17/13 1001  Gross per 24 hour  Intake    170 ml  Output      0 ml  Net    170 ml    Exam:  General: alert & oriented x 3 In NAD  Cardiovascular: RRR, nl S1 s2  Respiratory: Positive for wheezes in both lung fields, scattered rhonchi, no crackles  Abdomen: soft +BS NT/ND, no masses palpable  Extremities: No cyanosis and no edema      Data Reviewed: Basic Metabolic Panel:  Recent Labs Lab 12/16/13 1943 12/17/13 0135  NA 138 135*  K 3.5* 3.5*  CL 101 98  CO2 20 19  GLUCOSE 97 167*  BUN 9 10  CREATININE 0.69 0.74  CALCIUM 9.1 9.2    Liver Function Tests:  Recent Labs Lab 12/17/13 0135  AST 13  ALT 6  ALKPHOS 94  BILITOT 0.6  PROT 8.0  ALBUMIN 3.5   No results found for this basename: LIPASE, AMYLASE,  in the last 168 hours No results found for this basename: AMMONIA,  in the last 168 hours  CBC:  Recent Labs Lab 12/16/13 1943 12/17/13 0135  WBC 8.1 8.9  NEUTROABS 5.5 7.4  HGB 12.4 12.9  HCT 36.7 37.6  MCV 86.2 87.0  PLT 316 350    Cardiac Enzymes: No results found for this basename: CKTOTAL, CKMB, CKMBINDEX, TROPONINI,  in the last 168 hours BNP (last 3 results)  Recent Labs  12/16/13 1943  PROBNP 639.0*     CBG:  Recent Labs Lab 12/16/13 2212 12/17/13 0053 12/17/13 0753  GLUCAP 88 161* 157*    No results found for this or any previous visit (from the past 240 hour(s)).   Studies: Dg Chest Portable 1 View  12/16/2013   CLINICAL DATA:  Bradycardia, shortness of breath. Abdominal pain. History of asthma.  EXAM: PORTABLE CHEST - 1 VIEW  COMPARISON:  Chest radiograph December 27, 2012   FINDINGS: Cardiac silhouette appears unremarkable. Mildly calcified aortic knob. Mild chronic interstitial changes and increased lung volumes. No pleural effusions focal consolidations. No pneumothorax. Skin fold RIGHT midlung zone.  Soft tissue planes and included osseous structures are nonsuspicious; mild scoliosis.  IMPRESSION: Suspected COPD without superimposed acute cardiopulmonary process.   Electronically Signed   By: Awilda Metroourtnay  Bloomer   On: 12/16/2013 20:50    Scheduled Meds: . antiseptic oral rinse  7 mL Mouth Rinse BID  . aspirin EC  81 mg Oral q morning - 10a  . budesonide (PULMICORT) nebulizer solution  0.25 mg Nebulization BID  . citalopram  20 mg Oral q morning - 10a  . donepezil  10 mg Oral Daily  . enoxaparin (LOVENOX) injection  40 mg Subcutaneous Q24H  . insulin aspart  0-9 Units Subcutaneous TID WC  . insulin glargine  25 Units Subcutaneous QHS  . labetalol  200 mg Oral BID  . montelukast  10 mg Oral QHS  . NIFEdipine  30 mg Oral q morning - 10a  . pantoprazole  40 mg Oral Daily  . risperiDONE  1 mg Oral BID  . sodium chloride  3 mL Intravenous Q12H  . sodium chloride  3 mL Intravenous Q12H   Continuous Infusions:   Principal Problem:   Shortness of breath Active Problems:   Diastolic CHF   COPD exacerbation   Diabetes mellitus type 2, controlled    Time spent: 40 minutes   [facility] 2 LLCBROL,Manami Tutor  [facility] Pager [phone]. If 7PM-7AM, please contact night-coverage at [email or web address], password [email or web address] 12/17/2013, 10:39 AM  LOS: 1 day

## 2013-12-17 NOTE — Progress Notes (Signed)
Nutrition Brief Note  Patient identified on the Malnutrition Screening Tool (MST) Report  Wt Readings from Last 15 Encounters:  12/17/13 153 lb 7 oz (69.6 kg)  12/14/13 150 lb (68.04 kg)  07/16/13 155 lb (70.308 kg)  02/16/13 158 lb 4 oz (71.782 kg)  12/30/12 160 lb 15 oz (73 kg)  12/11/12 164 lb (74.39 kg)  09/11/12 165 lb (74.844 kg)  06/05/12 170 lb (77.111 kg)  05/26/12 172 lb 13.5 oz (78.4 kg)  04/06/12 175 lb (79.379 kg)  11/05/11 154 lb (69.854 kg)  08/02/11 162 lb (73.483 kg)  07/12/11 162 lb (73.483 kg)  06/09/11 170 lb 12.8 oz (77.474 kg)  05/24/11 176 lb 2.4 oz (79.9 kg)   Alicia Kent is a 80 y.o. female with history of COPD, hypertension, diastolic CHF last EF measured in 2013 was 60-65%, chronic LBBB, diabetes mellitus type 2 was brought to the ER after patient's daughter found that patient was getting increasingly short of breath yesterday. Patient has been having some wheezing and cough but no fever chills or chest pain. Initially patient also was mildly diaphoretic. In the ER patient was found to be wheezing and on exertion patient was getting easily hypoxic.  Pt reports appetite is generally good. She reports it was only bad the night PTA due to having no dinner. She reports UBW is around 158#. Wt hx reveals only mild wt loss in the past year, however, wt loss trend over the past several years. She is unable to give a very reliable wt hx. She denies any issues chewing or swallowing.   Nutrition-focused physical exam reveals no sings of fat or muscle depletion.   Pt declined offer of nutritional supplement, reports "those are too fattening". She reports she thinks she thinks her appetite is good enough to meet her needs. Educated pt on importance of good PO intake. She denies further nutritional needs or questions at this time.   Body mass index is 25.53 kg/(m^2). Patient meets criteria for overweight based on current BMI.   Current diet order is Heart Healthy/ Carb  Modified, patient is consuming approximately 90% of meals at this time. Labs and medications reviewed.   No nutrition interventions warranted at this time. If nutrition issues arise, please consult RD.   Nicholette Dolson A. Mayford KnifeWilliams, RD, LDN Pager: [phone] After hours Pager: [phone]

## 2013-12-17 NOTE — Plan of Care (Signed)
Problem: Phase I Progression Outcomes Goal: O2 sats > or equal 90% or at baseline Outcome: Progressing Weening oxygen from 4L to 2.5L nasal cannula. Still progressing.  Goal: Progress activity as tolerated unless otherwise ordered Outcome: Progressing Physical Therapy evaluation ordered

## 2013-12-17 NOTE — Evaluation (Signed)
Physical Therapy Evaluation Patient Details Name: Alicia Kent MRN: [id number] DOB: January 02, 1931 Today's Date: 12/17/2013   History of Present Illness  pt presents with SOB, diarrhea, and hx of COPD, HF, and Dementia.    Clinical Impression  Pt generally weak and becomes dyspneic with minimal activity.  Pt also with loose BM in bed and more in commode.  Pt states her daughter takes care of her at home, but no family present to confirm level of A available at home.  At this time pt may need SNF level of care at D/C unless family feels they can provide 24hr care and utilize W/C in pt's home.  Will continue to follow.      Follow Up Recommendations SNF    Equipment Recommendations  None recommended by PT    Recommendations for Other Services       Precautions / Restrictions Precautions Precautions: Fall Restrictions Weight Bearing Restrictions: No      Mobility  Bed Mobility Overal bed mobility: Needs Assistance Bed Mobility: Supine to Sit     Supine to sit: Min assist;HOB elevated     General bed mobility comments: pt needs increased time and use of bedrail to A with coming to sitting.  A provided at trunk.    Transfers Overall transfer level: Needs assistance Equipment used: Rolling walker (2 wheeled) Transfers: Sit to/from UGI CorporationStand;Stand Pivot Transfers Sit to Stand: Min assist Stand pivot transfers: Min assist       General transfer comment: pt needs A to power up to stand and for balance during pivot.  pt with increased dyspnea during minimal mobility while on 3L O2 and sats 99%.    Ambulation/Gait Ambulation/Gait assistance: Min assist Ambulation Distance (Feet): 5 Feet Assistive device: Rolling walker (2 wheeled) Gait Pattern/deviations: Step-through pattern;Decreased stride length;Trunk flexed     General Gait Details: pt moves slowly and very labored with minimal mobility.    Stairs            Wheelchair Mobility    Modified Rankin (Stroke Patients  Only)       Balance Overall balance assessment: Needs assistance Sitting-balance support: No upper extremity supported;Feet supported Sitting balance-Leahy Scale: Fair     Standing balance support: Bilateral upper extremity supported Standing balance-Leahy Scale: Poor                               Pertinent Vitals/Pain Pain Assessment: No/denies pain    Home Living Family/patient expects to be discharged to:: Skilled nursing facility                 Additional Comments: pt lives with daughter who A with care prior to admit.      Prior Function Level of Independence: Needs assistance   Gait / Transfers Assistance Needed: Used rollator in the house and W/C in community.    ADL's / Homemaking Assistance Needed: Daughter performs all homemaking tasks and A with ADLs.          Hand Dominance        Extremity/Trunk Assessment   Upper Extremity Assessment: Generalized weakness           Lower Extremity Assessment: Generalized weakness      Cervical / Trunk Assessment: Kyphotic  Communication   Communication: No difficulties  Cognition Arousal/Alertness: Awake/alert Behavior During Therapy: WFL for tasks assessed/performed Overall Cognitive Status: No family/caregiver present to determine baseline cognitive functioning  General Comments      Exercises        Assessment/Plan    PT Assessment Patient needs continued PT services  PT Diagnosis Difficulty walking;Generalized weakness   PT Problem List Decreased strength;Decreased activity tolerance;Decreased balance;Decreased mobility;Decreased cognition;Decreased knowledge of use of DME;Cardiopulmonary status limiting activity  PT Treatment Interventions DME instruction;Gait training;Functional mobility training;Therapeutic activities;Therapeutic exercise;Stair training;Balance training;Patient/family education   PT Goals (Current goals can be found in the Care  Plan section) Acute Rehab PT Goals Patient Stated Goal: None stated. PT Goal Formulation: With patient Time For Goal Achievement: 12/31/13 Potential to Achieve Goals: Good    Frequency Min 2X/week   Barriers to discharge        Co-evaluation               End of Session Equipment Utilized During Treatment: Gait belt;Oxygen Activity Tolerance: Patient limited by fatigue Patient left: in chair;with call bell/phone within reach;with chair alarm set Nurse Communication: Mobility status         Time: [id number] PT Time Calculation (min): 33 min   Charges:   PT Evaluation $Initial PT Evaluation Tier I: 1 Procedure PT Treatments $Therapeutic Activity: 23-37 mins   PT G CodesSunny Schlein:          Desa Rech F, [place]PT [phone] 12/17/2013, 11:45 AM

## 2013-12-17 NOTE — Progress Notes (Signed)
Pt arrive to unit in no s/s of distress. Pt A&Ox2 - pt confused to place and situation. Pt reoriented. Pt oriented to room and unit. VS stable. RR elevated - reassessed after pt was settled in bed - wnl. Tele applied. Report received by charge nurse from Selena BattenKim, ED nurse prior to pt's arrival. Whiteboard updated. Callbell within reach. Will continue to monitor.

## 2013-12-18 ENCOUNTER — Inpatient Hospital Stay (HOSPITAL_COMMUNITY): Payer: Medicare HMO

## 2013-12-18 DIAGNOSIS — I517 Cardiomegaly: Secondary | ICD-10-CM

## 2013-12-18 LAB — COMPREHENSIVE METABOLIC PANEL
ALK PHOS: 82 U/L (ref 39–117)
ALT: 7 U/L (ref 0–35)
ANION GAP: 13 (ref 5–15)
AST: 19 U/L (ref 0–37)
Albumin: 3.4 g/dL — ABNORMAL LOW (ref 3.5–5.2)
BUN: 17 mg/dL (ref 6–23)
CO2: 25 mEq/L (ref 19–32)
CREATININE: 0.88 mg/dL (ref 0.50–1.10)
Calcium: 8.8 mg/dL (ref 8.4–10.5)
Chloride: 99 mEq/L (ref 96–112)
GFR calc non Af Amer: 60 mL/min — ABNORMAL LOW (ref >90)
GFR, EST AFRICAN AMERICAN: 70 mL/min — AB (ref >90)
Glucose, Bld: 99 mg/dL (ref 70–99)
POTASSIUM: 4.6 meq/L (ref 3.7–5.3)
Sodium: 137 mEq/L (ref 137–147)
TOTAL PROTEIN: 7.7 g/dL (ref 6.0–8.3)
Total Bilirubin: 0.4 mg/dL (ref 0.3–1.2)

## 2013-12-18 LAB — GLUCOSE, CAPILLARY
GLUCOSE-CAPILLARY: 123 mg/dL — AB (ref 70–99)
Glucose-Capillary: 110 mg/dL — ABNORMAL HIGH (ref 70–99)
Glucose-Capillary: 172 mg/dL — ABNORMAL HIGH (ref 70–99)
Glucose-Capillary: 132 mg/dL — ABNORMAL HIGH (ref 70–99)

## 2013-12-18 LAB — TROPONIN I: Troponin I: <0.3 ng/mL (ref <0.30)

## 2013-12-18 MED ORDER — TECHNETIUM TO 99M ALBUMIN AGGREGATED
6.0000 | Freq: Once | INTRAVENOUS | Status: AC | PRN
  Administered 2013-12-18: 6 via INTRAVENOUS

## 2013-12-18 MED ORDER — TECHNETIUM TC 99M DIETHYLENETRIAME-PENTAACETIC ACID: 40.0000 | Freq: Once | INTRAVENOUS | Status: AC | PRN

## 2013-12-18 MED ORDER — FUROSEMIDE 10 MG/ML IJ SOLN
20.0000 mg | Freq: Once | INTRAMUSCULAR | Status: AC
  Administered 2013-12-18: 20 mg via INTRAVENOUS
  Filled 2013-12-18: qty 2

## 2013-12-18 MED ORDER — POTASSIUM CHLORIDE CRYS ER 20 MEQ PO TBCR
40.0000 meq | EXTENDED_RELEASE_TABLET | Freq: Once | ORAL | Status: AC
  Administered 2013-12-18: 40 meq via ORAL
  Filled 2013-12-18: qty 2

## 2013-12-18 NOTE — Progress Notes (Signed)
Echocardiogram 2D Echocardiogram has been performed.  Dorothey BasemanReel, Rockelle Heuerman M 12/18/2013, 4:45 PM

## 2013-12-18 NOTE — Progress Notes (Addendum)
[facility] PROGRESS NOTE  Alicia Kent ZOX:[id number] DOB: September 01, 1930 DOA: 12/16/2013 PCP: Oliver BarreJames John, MD  Assessment/Plan: Principal Problem:   Shortness of breath Active Problems:   Diastolic CHF   COPD exacerbation   Diabetes mellitus type 2, controlled   1. Shortness of breath - probably a combination of acute COPD and CHF. continue Lasix 20 mg IV and continue with nebulizer and Pulmicort. Check 2-D echo. D-dimer elevated , check VQ scan, repeat chest x-ray, venous Doppler, Closely follow intake output and metabolic panel and daily weights. Result will repeat chest x-ray pending 2. Hypertension - continue Procardia, IV Lasix. 3. Diabetes mellitus type 2 - controlled, continue home medications. Sliding scale insulin, Lantus 4. Dementia - continue home medications. 5. Depression - continue home medications. 6. Diarrhea resolved, C. difficile negative    Code Status: DO NOT RESUSCITATE  Family Communication: family updated about patient's clinical progress  Disposition Plan: PT/OT eval recommended SNF  Brief narrative:  Alicia Kent is a 80 y.o. female with history of COPD, hypertension, diastolic CHF last EF measured in 2013 was 60-65%, chronic LBBB, diabetes mellitus type 2 was brought to the ER after patient's daughter found that patient was getting increasingly short of breath yesterday. Patient has been having some wheezing and cough but no fever chills or chest pain. Initially patient also was mildly diaphoretic. Blood sugar was around 80. In the ER patient was found to be wheezing and on exertion patient was getting easily hypoxic. Patient has been admitted for shortness of breath most likely from COPD with possible CHF also. Patient did not have any nausea vomiting abdominal pain diarrhea.  Consultants:  None Procedures:  None Antibiotics:  None  HPI/Subjective: Sitting comfortably in bed and having breakfast  Objective: Filed Vitals:   12/18/13 0506 12/18/13  0704 12/18/13 0750 12/18/13 0919  BP: 131/50   131/53  Pulse: 51     Temp: 97.9 F (36.6 C)     TempSrc: Oral     Resp: 18     Height:      Weight:  68.1 kg (150 lb 2.1 oz)    SpO2: 100%  99%     Intake/Output Summary (Last 24 hours) at 12/18/13 1109 Last data filed at 12/18/13 0919  Gross per 24 hour  Intake    363 ml  Output     50 ml  Net    313 ml    Exam:  General: alert & oriented x 3 In NAD  Cardiovascular: RRR, nl S1 s2  Respiratory: Decreased breath sounds at the bases, scattered rhonchi, no crackles  Abdomen: soft +BS NT/ND, no masses palpable  Extremities: No cyanosis and no edema      Data Reviewed: Basic Metabolic Panel:  Recent Labs Lab 12/16/13 1943 12/17/13 0135  NA 138 135*  K 3.5* 3.5*  CL 101 98  CO2 20 19  GLUCOSE 97 167*  BUN 9 10  CREATININE 0.69 0.74  CALCIUM 9.1 9.2    Liver Function Tests:  Recent Labs Lab 12/17/13 0135  AST 13  ALT 6  ALKPHOS 94  BILITOT 0.6  PROT 8.0  ALBUMIN 3.5   No results found for this basename: LIPASE, AMYLASE,  in the last 168 hours No results found for this basename: AMMONIA,  in the last 168 hours  CBC:  Recent Labs Lab 12/16/13 1943 12/17/13 0135  WBC 8.1 8.9  NEUTROABS 5.5 7.4  HGB 12.4 12.9  HCT 36.7 37.6  MCV 86.2 87.0  PLT 316 350    Cardiac Enzymes: No results found for this basename: CKTOTAL, CKMB, CKMBINDEX, TROPONINI,  in the last 168 hours BNP (last 3 results)  Recent Labs  12/16/13 1943  PROBNP 639.0*     CBG:  Recent Labs Lab 12/17/13 0753 12/17/13 1154 12/17/13 1714 12/17/13 2154 12/18/13 0728  GLUCAP 157* 207* 108* 153* 110*    Recent Results (from the past 240 hour(s))  CLOSTRIDIUM DIFFICILE BY PCR     Status: None   Collection Time    12/17/13 11:29 AM      Result Value Ref Range Status   C difficile by pcr NEGATIVE  NEGATIVE Final     Studies: Dg Chest Portable 1 View  12/16/2013   CLINICAL DATA:  Bradycardia, shortness of breath.  Abdominal pain. History of asthma.  EXAM: PORTABLE CHEST - 1 VIEW  COMPARISON:  Chest radiograph December 27, 2012  FINDINGS: Cardiac silhouette appears unremarkable. Mildly calcified aortic knob. Mild chronic interstitial changes and increased lung volumes. No pleural effusions focal consolidations. No pneumothorax. Skin fold RIGHT midlung zone.  Soft tissue planes and included osseous structures are nonsuspicious; mild scoliosis.  IMPRESSION: Suspected COPD without superimposed acute cardiopulmonary process.   Electronically Signed   By: Awilda Metroourtnay  Bloomer   On: 12/16/2013 20:50    Scheduled Meds: . antiseptic oral rinse  7 mL Mouth Rinse BID  . aspirin EC  81 mg Oral q morning - 10a  . budesonide (PULMICORT) nebulizer solution  0.25 mg Nebulization BID  . citalopram  20 mg Oral q morning - 10a  . donepezil  10 mg Oral Daily  . enoxaparin (LOVENOX) injection  40 mg Subcutaneous Q24H  . insulin aspart  0-9 Units Subcutaneous TID WC  . insulin glargine  25 Units Subcutaneous QHS  . labetalol  200 mg Oral BID  . montelukast  10 mg Oral QHS  . NIFEdipine  30 mg Oral q morning - 10a  . pantoprazole  40 mg Oral Daily  . risperiDONE  1 mg Oral BID  . sodium chloride  3 mL Intravenous Q12H  . sodium chloride  3 mL Intravenous Q12H   Continuous Infusions:   Principal Problem:   Shortness of breath Active Problems:   Diastolic CHF   COPD exacerbation   Diabetes mellitus type 2, controlled    Time spent: 40 minutes   [facility] Center - Marble FallsBROL,Yassir Enis  [facility] Pager [phone]. If 7PM-7AM, please contact night-coverage at [email or web address], password [email or web address] 12/18/2013, 11:09 AM  LOS: 2 days

## 2013-12-19 DIAGNOSIS — M79609 Pain in unspecified limb: Secondary | ICD-10-CM

## 2013-12-19 DIAGNOSIS — R0602 Shortness of breath: Secondary | ICD-10-CM

## 2013-12-19 LAB — TROPONIN I

## 2013-12-19 LAB — GLUCOSE, CAPILLARY
GLUCOSE-CAPILLARY: 95 mg/dL (ref 70–99)
GLUCOSE-CAPILLARY: 93 mg/dL (ref 70–99)
GLUCOSE-CAPILLARY: 107 mg/dL — AB (ref 70–99)
Glucose-Capillary: 112 mg/dL — ABNORMAL HIGH (ref 70–99)

## 2013-12-19 MED ORDER — FUROSEMIDE 10 MG/ML IJ SOLN
20.0000 mg | Freq: Every day | INTRAMUSCULAR | Status: DC
  Administered 2013-12-19 – 2013-12-20 (×2): 20 mg via INTRAVENOUS
  Filled 2013-12-19 (×2): qty 2

## 2013-12-19 NOTE — Clinical Social Work Note (Signed)
CSW continues to follow for d/c planning. CSW attempted to contact patient's daughter French Anaracy, and left a message for follow-up. CSW to follow tomorrow.  Sutton Plake Patrick-[place], LCSWA Weekend Clinical Social Worker [phone]

## 2013-12-19 NOTE — Progress Notes (Signed)
*  PRELIMINARY RESULTS* Vascular Ultrasound Lower extremity venous duplex has been completed.  Preliminary findings: no evidence of DVT  Farrel DemarkJill Eunice, RDMS, RVT  12/19/2013, 10:22 AM

## 2013-12-19 NOTE — Progress Notes (Signed)
[facility] PROGRESS NOTE  Alicia Kent WUJ:[id number] DOB: [birth year] DOA: 12/16/2013 PCP: Oliver BarreJames John, MD  Assessment/Plan: Principal Problem:   Shortness of breath Active Problems:   Diastolic CHF   COPD exacerbation   Diabetes mellitus type 2, controlled   1. Shortness of breath - probably a combination of acute COPD and acute on chronic diastolic CHF. continue Lasix 20 mg IV and continue with nebulizer and Pulmicort. 2-D echo shows EF of 55-60% with grade 1 diastolic dysfunction. D-dimer elevated , negative VQ scan and venous Doppler, . Closely follow intake output and metabolic panel and daily weights. . 2. Hypertension - continue Procardia, IV Lasix. 3. Diabetes mellitus type 2 - controlled, continue home medications. Sliding scale insulin, Lantus 4. Dementia - continue home medications. 5. Depression - continue home medications. 6. Diarrhea resolved, C. difficile negative    Code Status: DO NOT RESUSCITATE  Family Communication: family updated about patient's clinical progress  Disposition Plan: PT/OT eval recommended SNF , will be ready tomorrow, consulted social work ASAP   Brief narrative:  Alicia BoucheMary A Kent is a 80 y.o. female with history of COPD, hypertension, diastolic CHF last EF measured in 2013 was 60-65%, chronic LBBB, diabetes mellitus type 2 was brought to the ER after patient's daughter found that patient was getting increasingly short of breath yesterday. Patient has been having some wheezing and cough but no fever chills or chest pain. Initially patient also was mildly diaphoretic. Blood sugar was around 80. In the ER patient was found to be wheezing and on exertion patient was getting easily hypoxic. Patient has been admitted for shortness of breath most likely from COPD with possible CHF also. Patient did not have any nausea vomiting abdominal pain diarrhea.  Consultants:  None Procedures:  None Antibiotics:  None  HPI/Subjective:  Having a bath, no  complaints, comfortable  Objective: Filed Vitals:   12/18/13 2245 12/19/13 0426 12/19/13 0517 12/19/13 0719  BP: 138/52  123/57   Pulse: 60  55   Temp:   97.8 F (36.6 C)   TempSrc:   Oral   Resp:   17   Height:      Weight:  68.9 kg (151 lb 14.4 oz)    SpO2:   95% 97%    Intake/Output Summary (Last 24 hours) at 12/19/13 1058 Last data filed at 12/18/13 1915  Gross per 24 hour  Intake    340 ml  Output      0 ml  Net    340 ml    Exam:  General: alert & oriented x 3 In NAD  Cardiovascular: RRR, nl S1 s2  Respiratory: Decreased breath sounds at the bases, scattered rhonchi, no crackles  Abdomen: soft +BS NT/ND, no masses palpable  Extremities: No cyanosis and no edema      Data Reviewed: Basic Metabolic Panel:  Recent Labs Lab 12/16/13 1943 12/17/13 0135 12/18/13 1224  NA 138 135* 137  K 3.5* 3.5* 4.6  CL 101 98 99  CO2 20 19 25   GLUCOSE 97 167* 99  BUN 9 10 17   CREATININE 0.69 0.74 0.88  CALCIUM 9.1 9.2 8.8    Liver Function Tests:  Recent Labs Lab 12/17/13 0135 12/18/13 1224  AST 13 19  ALT 6 7  ALKPHOS 94 82  BILITOT 0.6 0.4  PROT 8.0 7.7  ALBUMIN 3.5 3.4*   No results found for this basename: LIPASE, AMYLASE,  in the last 168 hours No results found for this basename: AMMONIA,  in the last 168 hours  CBC:  Recent Labs Lab 12/16/13 1943 12/17/13 0135  WBC 8.1 8.9  NEUTROABS 5.5 7.4  HGB 12.4 12.9  HCT 36.7 37.6  MCV 86.2 87.0  PLT 316 350    Cardiac Enzymes:  Recent Labs Lab 12/18/13 1224 12/18/13 2058 12/19/13 0130  TROPONINI <0.30 <0.30 <0.30   BNP (last 3 results)  Recent Labs  12/16/13 1943  PROBNP 639.0*     CBG:  Recent Labs Lab 12/18/13 0728 12/18/13 1223 12/18/13 1706 12/18/13 2202 12/19/13 0801  GLUCAP 110* 123* 172* 132* 95    Recent Results (from the past 240 hour(s))  CLOSTRIDIUM DIFFICILE BY PCR     Status: None   Collection Time    12/17/13 11:29 AM      Result Value Ref Range Status    C difficile by pcr NEGATIVE  NEGATIVE Final     Studies: Nm Pulmonary Perf And Vent  12/18/2013   CLINICAL DATA:  COPD and hypertension.  Elevated D-dimer.  EXAM: NUCLEAR MEDICINE VENTILATION - PERFUSION LUNG SCAN  TECHNIQUE: Ventilation images were obtained in multiple projections using inhaled aerosol technetium 99 M DTPA. Perfusion images were obtained in multiple projections after intravenous injection of Tc-3928m MAA.  RADIOPHARMACEUTICALS:  40 mCi Tc-3728m DTPA aerosol and 6 mCi Tc-10228m MAA  COMPARISON:  Single view of the chest 12/18/2013.  FINDINGS: Ventilation: No focal ventilation defect.  Perfusion: No wedge shaped peripheral perfusion defects to suggest acute pulmonary embolism.  IMPRESSION: Negative for pulmonary embolus.   Electronically Signed   By: Drusilla Kannerhomas  Dalessio M.D.   On: 12/18/2013 12:34   Dg Chest Port 1 View  12/18/2013   CLINICAL DATA:  Shortness of breath.  EXAM: PORTABLE CHEST - 1 VIEW  COMPARISON:  Single view of the chest 12/16/2013 and 12/27/2012.  FINDINGS: The lungs are clear. Heart size is normal. There is no pneumothorax or pleural effusion.  IMPRESSION: No acute disease.   Electronically Signed   By: Drusilla Kannerhomas  Dalessio M.D.   On: 12/18/2013 11:38   Dg Chest Portable 1 View  12/16/2013   CLINICAL DATA:  Bradycardia, shortness of breath. Abdominal pain. History of asthma.  EXAM: PORTABLE CHEST - 1 VIEW  COMPARISON:  Chest radiograph December 27, 2012  FINDINGS: Cardiac silhouette appears unremarkable. Mildly calcified aortic knob. Mild chronic interstitial changes and increased lung volumes. No pleural effusions focal consolidations. No pneumothorax. Skin fold RIGHT midlung zone.  Soft tissue planes and included osseous structures are nonsuspicious; mild scoliosis.  IMPRESSION: Suspected COPD without superimposed acute cardiopulmonary process.   Electronically Signed   By: Awilda Metroourtnay  Bloomer   On: 12/16/2013 20:50    Scheduled Meds: . antiseptic oral rinse  7 mL Mouth  Rinse BID  . aspirin EC  81 mg Oral q morning - 10a  . budesonide (PULMICORT) nebulizer solution  0.25 mg Nebulization BID  . citalopram  20 mg Oral q morning - 10a  . donepezil  10 mg Oral Daily  . enoxaparin (LOVENOX) injection  40 mg Subcutaneous Q24H  . furosemide  20 mg Intravenous Daily  . insulin aspart  0-9 Units Subcutaneous TID WC  . insulin glargine  25 Units Subcutaneous QHS  . labetalol  200 mg Oral BID  . montelukast  10 mg Oral QHS  . NIFEdipine  30 mg Oral q morning - 10a  . pantoprazole  40 mg Oral Daily  . risperiDONE  1 mg Oral BID  . sodium chloride  3 mL Intravenous  Q12H  . sodium chloride  3 mL Intravenous Q12H   Continuous Infusions:   Principal Problem:   Shortness of breath Active Problems:   Diastolic CHF   COPD exacerbation   Diabetes mellitus type 2, controlled    Time spent: 40 minutes   [facility]  [facility] Pager [phone]. If 7PM-7AM, please contact night-coverage at [email or web address], password [facility] 12/19/2013, 10:58 AM  LOS: 3 days

## 2013-12-19 NOTE — Plan of Care (Signed)
Problem: Phase I Progression Outcomes Goal: O2 sats > or equal 90% or at baseline Outcome: Adequate for Discharge Patient dependent on 2L at home

## 2013-12-20 LAB — GLUCOSE, CAPILLARY
Glucose-Capillary: 85 mg/dL (ref 70–99)
Glucose-Capillary: 123 mg/dL — ABNORMAL HIGH (ref 70–99)

## 2013-12-20 MED ORDER — FLUTICASONE PROPIONATE 50 MCG/ACT NA SUSP: 2.0000 | Freq: Every day | NASAL | Status: AC

## 2013-12-20 MED ORDER — LEVALBUTEROL TARTRATE 45 MCG/ACT IN AERO: 2.0000 | INHALATION_SPRAY | Freq: Four times a day (QID) | RESPIRATORY_TRACT | Status: AC | PRN

## 2013-12-20 MED ORDER — FUROSEMIDE 20 MG PO TABS: 20.0000 mg | ORAL_TABLET | Freq: Every day | ORAL | Status: AC

## 2013-12-20 MED ORDER — TIOTROPIUM BROMIDE MONOHYDRATE 18 MCG IN CAPS: 18.0000 ug | ORAL_CAPSULE | Freq: Every morning | RESPIRATORY_TRACT | Status: DC

## 2013-12-20 NOTE — Progress Notes (Signed)
Occupational Therapy Evaluation Patient Details Name: Alicia Kent MRN: [id number] DOB: 21-Dec-1930 Today's Date: 12/20/2013    History of Present Illness pt presents with SOB, diarrhea, and hx of COPD, HF, and Dementia.     Clinical Impression   PTA, family assited with ADL. Pt currently requires min - mod A with mobility and max A with LB ADL. Pt will need to give 24/7 assistance after D/C. [facility] in functional status. No family available during session.     Follow Up Recommendations  Home health OT;Supervision/Assistance - 24 hour    Equipment Recommendations  None recommended by OT    Recommendations for Other Services       Precautions / Restrictions Precautions Precautions: Fall      Mobility Bed Mobility Overal bed mobility: Needs Assistance Bed Mobility: Supine to Sit     Supine to sit: Mod assist;HOB elevated     General bed mobility comments: assist to get BLE off bed and to scoot hips forward  Transfers Overall transfer level: Needs assistance Equipment used: Rolling walker (2 wheeled) Transfers: Sit to/from Stand Sit to Stand: Mod assist         General transfer comment: Pt able to initially power up but required mod A to achieve upright posture. unstqeady    Balance Overall balance assessment: Needs assistance Sitting-balance support: Feet supported;No upper extremity supported Sitting balance-Leahy Scale: Fair     Standing balance support: During functional activity;Bilateral upper extremity supported Standing balance-Leahy Scale: Poor Standing balance comment: relies heavily on RW                            ADL Overall ADL's : Needs assistance/impaired     Grooming: Set up;Sitting   Upper Body Bathing: Minimal assitance;Sitting   Lower Body Bathing: Maximal assistance;Sit to/from stand   Upper Body Dressing : Minimal assistance;Sitting   Lower Body Dressing: Maximal  assistance;Sit to/from stand   Toilet Transfer: Moderate assistance   Toileting- Clothing Manipulation and Hygiene: Total assistance (incontinent of urine. unaware)       Functional mobility during ADLs: Moderate assistance General ADL Comments: most likely decline in functin. Pt staes she was doing more before hospital admission     Vision                     Perception     Praxis      Pertinent Vitals/Pain Pain Assessment: No/denies pain     Hand Dominance Right   Extremity/Trunk Assessment Upper Extremity Assessment Upper Extremity Assessment: Generalized weakness   Lower Extremity Assessment Lower Extremity Assessment: Generalized weakness   Cervical / Trunk Assessment Cervical / Trunk Assessment: Kyphotic   Communication Communication Communication: No difficulties   Cognition Arousal/Alertness: Awake/alert Behavior During Therapy: WFL for tasks assessed/performed Overall Cognitive Status: No family/caregiver present to determine baseline cognitive functioning                     General Comments       Exercises       Shoulder Instructions      Home Living Family/patient expects to be discharged to:: Private residence Living Arrangements: Spouse/significant other Available Help at Discharge: Available 24 hours/day;Family Type of Home: House Home Access: Level entry     Home Layout: Multi-level     Bathroom Shower/Tub: Producer, television/film/videoWalk-in shower   Bathroom Toilet: Standard Bathroom Accessibility: Yes  How Accessible: Accessible via walker Home Equipment: Walker - 2 wheels;Bedside commode;Grab bars - toilet;Grab bars - tub/shower;Shower seat   Additional Comments: pt lives with daughter who A with care prior to admit.        Prior Functioning/Environment Level of Independence: Needs assistance  Gait / Transfers Assistance Needed: Used rollator in the house and W/C in community.   ADL's / Homemaking Assistance Needed: Daughter performs  all homemaking tasks and A with ADLs.          OT Diagnosis: Generalized weakness;Cognitive deficits (baselinedementia)   OT Problem List: Decreased strength;Decreased activity tolerance;Decreased safety awareness;Decreased knowledge of use of DME or AE;Decreased knowledge of precautions;Cardiopulmonary status limiting activity   OT Treatment/Interventions:      OT Goals(Current goals can be found in the care plan section) Acute Rehab OT Goals Patient Stated Goal: None stated. OT Goal Formulation:  (eval only)  OT Frequency:     Barriers to D/C:            Co-evaluation              End of Session Equipment Utilized During Treatment: Gait belt;Oxygen Nurse Communication: Mobility status  Activity Tolerance: Patient limited by fatigue Patient left: in bed;with call bell/phone within reach;with bed alarm set   Time: 1349-1409 OT Time Calculation (min): 20 min Charges:  OT General Charges $OT Visit: 1 Procedure OT Evaluation $Initial OT Evaluation Tier I: 1 Procedure OT Treatments $Self Care/Home Management : 8-22 mins G-Codes:    Rajesh Wyss,HILLARY 12/20/2013, 2:21 PM   [facility], Theilary [employer], OTR/L  [phone] 12/20/2013

## 2013-12-20 NOTE — Clinical Documentation Improvement (Signed)
Pt presented with progressive SOB.  Documentation reflects "became hypoxic / tachypneic" with ambulation.  O2 at 6 liters [place] initially on arrival, now at 2L.  Per ED note 12/16/13:  "She uses oxygen at home [facility](2LNC)".  Initially received Proventil and Atrovent nebulizers.  Currently receiving Pulmicort nebulizer 0.25mg  BID. Diagnoses:  COPD exacerbation and Acute on Chronic Diastolic Heart Failure.  Please document any additional associated clinical conditions, if any, in your progress note and carry over to the discharge summary.  Possible Clinical Conditions: -Chronic Respiratory Failure -Acute on Chronic Respiratory Failure -Other condition -Unable to determine at present    Thank you, Doy MinceVangela Precious Segall, RN [phone] Clinical Documentation Specialist

## 2013-12-20 NOTE — Discharge Summary (Signed)
Physician Discharge Summary  Alicia Kent MRN: [id number] DOB/AGE: 04/25/1930 80 y.o.  PCP: Cathlean Cower, MD   Admit date: 12/16/2013 Discharge date: 12/20/2013  Discharge Diagnoses:    Acute COPD exacerbation Acute on chronic diastolic heart failure Deconditioning   Diabetes mellitus type 2, controlled  Follow up recommendations Follow up with CBC in 5-7 days Followup BMP in one week Follow up with PCP in 1 week    Medication List    STOP taking these medications       cephALEXin 500 MG capsule  Commonly known as:  KEFLEX      TAKE these medications       aspirin 81 MG EC tablet  Take 81 mg by mouth every morning.     citalopram 20 MG tablet  Commonly known as:  CELEXA  Take 1 tablet (20 mg total) by mouth every morning.     donepezil 10 MG tablet  Commonly known as:  ARICEPT  Take 1 tablet (10 mg total) by mouth daily.     fluticasone 50 MCG/ACT nasal spray  Commonly known as:  FLONASE  Place 2 sprays into both nostrils daily.     furosemide 20 MG tablet  Commonly known as:  LASIX  Take 1 tablet (20 mg total) by mouth daily.     insulin glargine 100 UNIT/ML injection  Commonly known as:  LANTUS  Inject 0.25 mLs (25 Units total) into the skin at bedtime.     Insulin Pen Needle 31G X 5 MM Misc  Commonly known as:  B-D UF III MINI PEN NEEDLES  Use as directed for insulin.  Diagnosis code 250.00     ipratropium-albuterol 0.5-2.5 (3) MG/3ML Soln  Commonly known as:  DUONEB  Take 3 mLs by nebulization every 6 (six) hours as needed (shortness of breath).     labetalol 200 MG tablet  Commonly known as:  NORMODYNE  Take 1 tablet (200 mg total) by mouth 2 (two) times daily.     levalbuterol 45 MCG/ACT inhaler  Commonly known as:  XOPENEX HFA  Inhale 2 puffs into the lungs every 6 (six) hours as needed for wheezing.     loperamide 2 MG tablet  Commonly known as:  IMODIUM A-D  Take 2 mg by mouth as needed for diarrhea or loose stools.      montelukast 10 MG tablet  Commonly known as:  SINGULAIR  Take 1 tablet (10 mg total) by mouth at bedtime.     NIFEdipine 30 MG 24 hr tablet  Commonly known as:  PROCARDIA-XL/ADALAT-CC/NIFEDICAL-XL  Take 1 tablet (30 mg total) by mouth every morning.     omeprazole 20 MG capsule  Commonly known as:  PRILOSEC  Take 1 capsule (20 mg total) by mouth daily.     risperiDONE 1 MG tablet  Commonly known as:  RISPERDAL  Take 1 tablet (1 mg total) by mouth 2 (two) times daily.     tiotropium 18 MCG inhalation capsule  Commonly known as:  SPIRIVA HANDIHALER  Place 1 capsule (18 mcg total) into inhaler and inhale every morning.        Discharge Condition:    Disposition: Home with home health   Consults: None   Significant Diagnostic Studies: Nm Pulmonary Perf And Vent  12/18/2013   CLINICAL DATA:  COPD and hypertension.  Elevated D-dimer.  EXAM: NUCLEAR MEDICINE VENTILATION - PERFUSION LUNG SCAN  TECHNIQUE: Ventilation images were obtained in multiple projections using inhaled aerosol technetium 99  M DTPA. Perfusion images were obtained in multiple projections after intravenous injection of Tc-70mMAA.  RADIOPHARMACEUTICALS:  40 mCi Tc-923mTPA aerosol and 6 mCi Tc-9961mA  COMPARISON:  Single view of the chest 12/18/2013.  FINDINGS: Ventilation: No focal ventilation defect.  Perfusion: No wedge shaped peripheral perfusion defects to suggest acute pulmonary embolism.  IMPRESSION: Negative for pulmonary embolus.   Electronically Signed   By: ThoInge RiseD.   On: 12/18/2013 12:34   Dg Chest Port 1 View  12/18/2013   CLINICAL DATA:  Shortness of breath.  EXAM: PORTABLE CHEST - 1 VIEW  COMPARISON:  Single view of the chest 12/16/2013 and 12/27/2012.  FINDINGS: The lungs are clear. Heart size is normal. There is no pneumothorax or pleural effusion.  IMPRESSION: No acute disease.   Electronically Signed   By: ThoInge RiseD.   On: 12/18/2013 11:38   Dg Chest Portable 1  View  12/16/2013   CLINICAL DATA:  Bradycardia, shortness of breath. Abdominal pain. History of asthma.  EXAM: PORTABLE CHEST - 1 VIEW  COMPARISON:  Chest radiograph December 27, 2012  FINDINGS: Cardiac silhouette appears unremarkable. Mildly calcified aortic knob. Mild chronic interstitial changes and increased lung volumes. No pleural effusions focal consolidations. No pneumothorax. Skin fold RIGHT midlung zone.  Soft tissue planes and included osseous structures are nonsuspicious; mild scoliosis.  IMPRESSION: Suspected COPD without superimposed acute cardiopulmonary process.   Electronically Signed   By: CouElon AlasOn: 12/16/2013 20:50       Microbiology: Recent Results (from the past 240 hour(s))  CLOSTRIDIUM DIFFICILE BY PCR     Status: None   Collection Time    12/17/13 11:29 AM      Result Value Ref Range Status   C difficile by pcr NEGATIVE  NEGATIVE Final     Labs: Results for orders placed during the hospital encounter of 12/16/13 (from the past 48 hour(s))  GLUCOSE, CAPILLARY     Status: Abnormal   Collection Time    12/18/13 12:23 PM      Result Value Ref Range   Glucose-Capillary 123 (*) 70 - 99 mg/dL  COMPREHENSIVE METABOLIC PANEL     Status: Abnormal   Collection Time    12/18/13 12:24 PM      Result Value Ref Range   Sodium 137  137 - 147 mEq/L   Potassium 4.6  3.7 - 5.3 mEq/L   Comment: HEMOLYSIS AT THIS LEVEL MAY AFFECT RESULT   Chloride 99  96 - 112 mEq/L   CO2 25  19 - 32 mEq/L   Glucose, Bld 99  70 - 99 mg/dL   BUN 17  6 - 23 mg/dL   Creatinine, Ser 0.88  0.50 - 1.10 mg/dL   Calcium 8.8  8.4 - 10.5 mg/dL   Total Protein 7.7  6.0 - 8.3 g/dL   Albumin 3.4 (*) 3.5 - 5.2 g/dL   AST 19  0 - 37 U/L   Comment: HEMOLYSIS AT THIS LEVEL MAY AFFECT RESULT   ALT 7  0 - 35 U/L   Comment: HEMOLYSIS AT THIS LEVEL MAY AFFECT RESULT   Alkaline Phosphatase 82  39 - 117 U/L   Total Bilirubin 0.4  0.3 - 1.2 mg/dL   GFR calc non Af Amer 60 (*) >90 mL/min   GFR  calc Af Amer 70 (*) >90 mL/min   Comment: (NOTE)     The eGFR has been calculated using the CKD EPI equation.  This calculation has not been validated in all clinical situations.     eGFR's persistently <90 mL/min signify possible Chronic Kidney     Disease.   Anion gap 13  5 - 15  TROPONIN I     Status: None   Collection Time    12/18/13 12:24 PM      Result Value Ref Range   Troponin I <0.30  <0.30 ng/mL   Comment:            Due to the release kinetics of cTnI,     a negative result within the first hours     of the onset of symptoms does not rule out     myocardial infarction with certainty.     If myocardial infarction is still suspected,     repeat the test at appropriate intervals.  GLUCOSE, CAPILLARY     Status: Abnormal   Collection Time    12/18/13  5:06 PM      Result Value Ref Range   Glucose-Capillary 172 (*) 70 - 99 mg/dL  TROPONIN I     Status: None   Collection Time    12/18/13  8:58 PM      Result Value Ref Range   Troponin I <0.30  <0.30 ng/mL   Comment:            Due to the release kinetics of cTnI,     a negative result within the first hours     of the onset of symptoms does not rule out     myocardial infarction with certainty.     If myocardial infarction is still suspected,     repeat the test at appropriate intervals.  GLUCOSE, CAPILLARY     Status: Abnormal   Collection Time    12/18/13 10:02 PM      Result Value Ref Range   Glucose-Capillary 132 (*) 70 - 99 mg/dL   Comment 1 Notify RN     Comment 2 Documented in Chart    TROPONIN I     Status: None   Collection Time    12/19/13  1:30 AM      Result Value Ref Range   Troponin I <0.30  <0.30 ng/mL   Comment:            Due to the release kinetics of cTnI,     a negative result within the first hours     of the onset of symptoms does not rule out     myocardial infarction with certainty.     If myocardial infarction is still suspected,     repeat the test at appropriate intervals.   GLUCOSE, CAPILLARY     Status: None   Collection Time    12/19/13  8:01 AM      Result Value Ref Range   Glucose-Capillary 95  70 - 99 mg/dL  GLUCOSE, CAPILLARY     Status: None   Collection Time    12/19/13 12:51 PM      Result Value Ref Range   Glucose-Capillary 93  70 - 99 mg/dL  GLUCOSE, CAPILLARY     Status: Abnormal   Collection Time    12/19/13  5:54 PM      Result Value Ref Range   Glucose-Capillary 107 (*) 70 - 99 mg/dL  GLUCOSE, CAPILLARY     Status: Abnormal   Collection Time    12/19/13  9:17 PM      Result Value Ref Range  Glucose-Capillary 112 (*) 70 - 99 mg/dL  GLUCOSE, CAPILLARY     Status: None   Collection Time    12/20/13  7:41 AM      Result Value Ref Range   Glucose-Capillary 85  70 - 99 mg/dL     HPI : Alicia Kent is a 80 y.o. female with history of COPD, hypertension, diastolic CHF last EF measured in 2013 was 60-65%, chronic LBBB, diabetes mellitus type 2 was brought to the ER after patient's daughter found that patient was getting increasingly short of breath yesterday. Patient has been having some wheezing and cough but no fever chills or chest pain. Initially patient also was mildly diaphoretic. Blood sugar was around 80. In the ER patient was found to be wheezing and on exertion patient was getting easily hypoxic. Patient has been admitted for shortness of breath most likely from COPD with possible CHF also. Patient did not have any nausea vomiting abdominal pain diarrhea.   HOSPITAL COURSE:  1. Shortness of breath - probably a combination of acute COPD exacerbation and acute on chronic diastolic CHF. Started on diuresis with Lasix 20 mg IV and treated with scheduled duoneb nebulizer and Pulmicort nebulizer. 2-D echo shows EF of 55-60% with grade 1 diastolic dysfunction. D-dimer elevated , negative VQ scan and venous Doppler, . Started on Flonase, Xopenex, Spiriva, Lasix prior to discharge. Patient needs close followup of her BMP to monitor her  electrolytes and renal function. Discussed this with the daughter Luther Bradley prior to discharge. 2. Hypertension - continue Procardia, by mouth Lasix. 3. Diabetes mellitus type 2 - controlled, continue home medications. Sliding scale insulin, Lantus 4. Dementia - continue home medications. 5. Depression - continue home medications. 6. Diarrhea resolved, C. difficile negative   Code Status: DO NOT RESUSCITATE     Discharge Exam: * Blood pressure 110/60, pulse 62, temperature 98 F (36.7 C), temperature source Axillary, resp. rate 16, height 5' 5"  (1.651 m), weight 67.8 kg (149 lb 7.6 oz), SpO2 100.00%.  General: alert & oriented x 3 In NAD  Cardiovascular: RRR, nl S1 s2  Respiratory: Decreased breath sounds at the bases, scattered rhonchi, no crackles  Abdomen: soft +BS NT/ND, no masses palpable  Extremities: No cyanosis and no edema          Follow-up Information   Follow up with Cathlean Cower, MD. Schedule an appointment as soon as possible for a visit in 1 week.   Specialties:  Internal Medicine, Radiology   Contact information:   [address] [place] [id number] [phone]       Signed: Reyne Dumas 12/20/2013, 11:42 AM

## 2013-12-20 NOTE — Progress Notes (Signed)
Patient was discharged home by MD order; discharged instructions review and give to patient an her daughter with care notes; IV DIC; skin intact; patient will be escorted to the car by nurse tech via wheelchair.

## 2013-12-20 NOTE — Clinical Social Work Psychosocial (Signed)
Clinical Social Work Department BRIEF PSYCHOSOCIAL ASSESSMENT 12/20/2013  Patient:  Alicia Kent, Alicia Kent     Account Number:  [id number]     Admit date:  12/16/2013  Clinical Social Worker:  Lovey Newcomer  Date/Time:  12/20/2013 10:20 AM  Referred by:  Physician  Date Referred:  12/20/2013 Referred for  SNF Placement   Other Referral:   NA   Interview type:  Patient Other interview type:   Patient alert and oriented at time of assessment. CSW also spoke with daughter.    PSYCHOSOCIAL DATA Living Status:  FAMILY Admitted from facility:   Level of care:   Primary support name:  Constance Haw Primary support relationship to patient:  CHILD, ADULT Degree of support available:   Support is good.    CURRENT CONCERNS Current Concerns  Post-Acute Placement   Other Concerns:   NA    SOCIAL WORK ASSESSMENT / PLAN CSW met with patient at bedside to complete assessment. Patient states that she will no agree to SNF placement and continues to adamantly refuse SNF placement. Patient lives with daughter Linus Orn at home and plans to return there. Daughter would like to see patient DC to [facility] but understands that the patient has the right to make this decision for herself. Daughter states that she will be able to manage patient's needs with HHPT services. CSW has notified RNCM. Daughter plans to have her husband transport patient home today. CSW will updated MD. Patient was calm and engaged in assessment. CSW signing off at this time.,   Assessment/plan status:  Psychosocial Support/Ongoing Assessment of Needs Other assessment/ plan:   NONE   Information/referral to community resources:   NONE    PATIENT'S/FAMILY'S RESPONSE TO PLAN OF CARE: Patient refuses SNF and plans to Dc home when stable. CSW has updated MD and RNCM. CSW signing off at this time.       Liz Beach MSW, [place], [place], [phone]

## 2013-12-20 NOTE — Care Management Note (Signed)
    Page 1 of 2   12/20/2013     11:48:11 AM CARE MANAGEMENT NOTE 12/20/2013  Patient:  Alicia Kent,Alicia Kent   Account Number:  [id number]  Date Initiated:  12/20/2013  Documentation initiated by:  Letha CapeAYLOR,Yissel Habermehl  Subjective/Objective Assessment:   dx copd ex  admit- lives with daughter.  Patient is on home oxygen.     Action/Plan:   pt eval- rec snf   Anticipated DC Date:  12/20/2013   Anticipated DC Plan:  HOME W HOME HEALTH SERVICES  In-house referral  Clinical Social Worker      DC Associate Professorlanning Services  CM consult      [employer]   Choice offered to / List presented to:  C-4 Adult Children        HH arranged  HH-1 RN  HH-2 PT  HH-3 OT  HH-4 NURSE'S AIDE  HH-6 SOCIAL WORKER      HH agency  [facility]   Status of service:  Completed, signed off Medicare Important Message given?  YES (If response is "NO", the following Medicare IM given date fields will be blank) Date Medicare IM given:  12/20/2013 Medicare IM given by:  Letha CapeAYLOR,Zamantha Strebel Date Additional Medicare IM given:   Additional Medicare IM given by:    Discharge Disposition:    Per UR Regulation:  Reviewed for med. necessity/level of care/duration of stay  If discussed at Long Length of Stay Meetings, dates discussed:    Comments:  12/20/13 1054 Letha Capeeborah Mariabella Nilsen RN, BSN [phone] patient is for dc today, NCM received referral from CSW stating daughter and patient [facility], [facility] , states they have used them before, NCM left message with Roanna Raidereborah Tucker with [facility], awaiting call back to see if they can take patient. Patient is on home oxygen with [facility], NCM informed daughter to bring [place] tank with her when she comes to pick patient up. NCM spoke with Gavin Poundeborah with [facility] and she states they can take patient they have had her before.   Soc will begin 24-48 hrs post dc.

## 2013-12-20 NOTE — Clinical Documentation Improvement (Signed)
Potassium as below.  Potassium chloride SA given.  Please identify any clinical conditions associated with the abnormal potassium value, if any, and document in your progress note and carry over to the discharge summary.  Component      Potassium  Latest Ref Rng      3.7 - 5.3 mEq/L  12/16/2013      3.5 (L)  12/17/2013     1:35 AM 3.5 (L)  12/18/2013     12:24 PM 4.6  12/20/2013         Possible Clinical Conditions: -Hypokalemia -Other Condition -Not Clinically Significant  Thank you, Doy MinceVangela Luda Charbonneau, RN [phone] Clinical Documentation Specialist

## 2013-12-22 ENCOUNTER — Telehealth: Payer: Self-pay | Admitting: *Deleted

## 2013-12-22 NOTE — Telephone Encounter (Signed)
Transition Care Management Follow-up Telephone Call D/C 12/20/13  How have you been since you were released from the hospital? Called pt spoke with daughter (tracy) she stated mom is doing ok still  Coughing a lot, weak from lying around in hospital   Do you understand why you were in the hospital? YES, they both understood why she was admitted   Do you understand the discharge instrcutions? YES, daughter stated yes she understood instructions. Also we reviewed again  Items Reviewed:  Medications reviewed: YES, reviewed daughter stated that mom not taking her furosemide & spiriva that was rx @ hosp. They had sent rx to mail service and she should received today or tomorrow  Allergies reviewed: YES, revieved  Dietary changes reviewed: NO  Referrals reviewed: YES, daughter stated [facility] was actually there this am. PT is schedule for tomorrow   Functional Questionnaire:   Activities of Daily Living (ADLs):   She states she is independent in the following: bathing and hygiene, walking, even though daughter help her Daughter states she doesn't require assistance    Any transportation issues/concerns?: NO   Any patient concerns? NO   Confirmed importance and date/time of follow-up visits scheduled: YES, daughter had already made appt for 12/29/13 with Dr. Jonny RuizJohn. Advise daughter to make sure they keep appt   Confirmed with patient if condition begins to worsen call PCP or go to the ER.  Patient was given the Call-a-Nurse line [phone]: YES

## 2013-12-24 ENCOUNTER — Other Ambulatory Visit: Payer: Self-pay | Admitting: Internal Medicine

## 2013-12-24 ENCOUNTER — Telehealth: Payer: Self-pay

## 2013-12-24 MED ORDER — LEVOFLOXACIN 250 MG PO TABS: 250.0000 mg | ORAL_TABLET | Freq: Every day | ORAL | Status: DC

## 2013-12-24 NOTE — Telephone Encounter (Signed)
Patient discharged from the hospital on Monday.  HHRN was at the home on Tuesday 12/21/13 patient was very congested.  HHRN at the home today and cough has increased and RN can hear the congestion in her lungs.  The patient was put on lasix and spiriva in the hospital.  Lasix she is taking, spiriva they cannot afford ($400).   There is no fever.  HHRN thinks the patient needs an antibiotic if possible.  She does have an appointment with PCP, but not until 12/29/13.  Advise please.

## 2013-12-24 NOTE — Telephone Encounter (Signed)
[facility] RN Cindy informed antibiotic sent in. (Left a Detailed message). Family informed as well.

## 2013-12-24 NOTE — Telephone Encounter (Signed)
Antibiotic done erx 

## 2013-12-29 ENCOUNTER — Inpatient Hospital Stay: Payer: Medicare HMO | Admitting: Internal Medicine

## 2014-01-04 ENCOUNTER — Encounter: Payer: Self-pay | Admitting: Internal Medicine

## 2014-01-04 ENCOUNTER — Ambulatory Visit (INDEPENDENT_AMBULATORY_CARE_PROVIDER_SITE_OTHER): Payer: Commercial Managed Care - HMO | Admitting: Internal Medicine

## 2014-01-04 VITALS — BP 140/76 | HR 72 | Temp 97.7°F

## 2014-01-04 DIAGNOSIS — F0391 Unspecified dementia with behavioral disturbance: Secondary | ICD-10-CM

## 2014-01-04 DIAGNOSIS — F03918 Unspecified dementia, unspecified severity, with other behavioral disturbance: Secondary | ICD-10-CM

## 2014-01-04 DIAGNOSIS — J9610 Chronic respiratory failure, unspecified whether with hypoxia or hypercapnia: Secondary | ICD-10-CM | POA: Insufficient documentation

## 2014-01-04 DIAGNOSIS — J9611 Chronic respiratory failure with hypoxia: Secondary | ICD-10-CM

## 2014-01-04 DIAGNOSIS — J449 Chronic obstructive pulmonary disease, unspecified: Secondary | ICD-10-CM

## 2014-01-04 DIAGNOSIS — I10 Essential (primary) hypertension: Secondary | ICD-10-CM

## 2014-01-04 MED ORDER — ALBUTEROL SULFATE HFA 108 (90 BASE) MCG/ACT IN AERS: 2.0000 | INHALATION_SPRAY | Freq: Four times a day (QID) | RESPIRATORY_TRACT | Status: AC | PRN

## 2014-01-04 MED ORDER — QUETIAPINE FUMARATE 50 MG PO TABS: 50.0000 mg | ORAL_TABLET | Freq: Every day | ORAL | Status: AC

## 2014-01-04 NOTE — Progress Notes (Signed)
Subjective:    Patient ID: Alicia BoucheMary A Wragg, female    DOB: 1930/10/31, 80 y.o.   MRN: [id number]  HPI  Here with family/daughter and son who remain supportive with 24/7 near 100% care; c/o inability to sleep well last 3 nights due to pt increased confusion, agitation, paranoia and not sleeping nighttime hrs as before, despite good med compliacne including the risperdal. Pt denies chest pain, increased sob or doe, wheezing, orthopnea, PND, increased LE swelling, palpitations, dizziness or syncope, and daughter reqeusts addition of alb MDI rescue inhaler,  Not using spiriva since so expensive.  Still doing ok with duonebs.  Needs port o2 added to order to [facility] for chronic hypoxic resp failure - 2L.  Also with a scratched area of redness to mid upper lumbar area where pt has scratched, found by home health worker, no fever/pain/red/swelling/drainage/ulcer, but more itchy than anything.  Needs [facility] form filled out regarding homebound status.   Past Medical History  Diagnosis Date  . Diabetes mellitus type II   . Hyperlipidemia   . HTN (hypertension)   . GERD (gastroesophageal reflux disease)   . Allergic rhinitis   . Depression   . Dementia     mild  . History of CVA (cerebrovascular accident)   . LBBB (left bundle branch block)   . AAA (abdominal aortic aneurysm)     small, last check in 05/2009, due for repeat in 05/2010  . Carotid stenosis 09/2009    40-59% RICA, 0-39% LICA  . LVH (left ventricular hypertrophy) 10/2009    Moderate, EF 55-60%, no regional wall motion abnormalities  . Shellfish allergy     anaphylaxis  . Osteopenia   . Vitamin D deficiency   . Fracture of rib of left side 12/2009  . Asthma 01/27/2011  . Diastolic CHF 08/02/2011  . AAA (abdominal aortic aneurysm) without rupture 07/16/2013  . Cataract    Past Surgical History  Procedure Laterality Date  . Ankle surgery      Right[place]  . Tracheostomy  2010    anaphylactic episode on vent  . Tubal ligation       reports that she quit smoking about 3 years ago. She does not have any smokeless tobacco history on file. She reports that she does not drink alcohol or use illicit drugs. family history includes Diabetes type II in her other; Hypertension in her brother; Rheum arthritis in her sister; Stroke in her brother. Allergies  Allergen Reactions  . Ibuprofen Anaphylaxis  . Shellfish-Derived Products Anaphylaxis    Swelling tongue and throat  . Ace Inhibitors Other (See Comments)    unknown  . Sulfa Antibiotics Nausea And Vomiting   Current Outpatient Prescriptions on File Prior to Visit  Medication Sig Dispense Refill  . aspirin 81 MG EC tablet Take 81 mg by mouth every morning.     . citalopram (CELEXA) 20 MG tablet Take 1 tablet (20 mg total) by mouth every morning. 90 tablet 3  . donepezil (ARICEPT) 10 MG tablet Take 1 tablet (10 mg total) by mouth daily. 90 tablet 3  . fluticasone (FLONASE) 50 MCG/ACT nasal spray Place 2 sprays into both nostrils daily. 15.8 g 3  . furosemide (LASIX) 20 MG tablet Take 1 tablet (20 mg total) by mouth daily. 30 tablet 2  . insulin glargine (LANTUS) 100 UNIT/ML injection Inject 0.25 mLs (25 Units total) into the skin at bedtime. 30 mL 3  . Insulin Pen Needle (B-D UF III MINI  PEN NEEDLES) 31G X 5 MM MISC Use as directed for insulin.  Diagnosis code 250.00 300 each 3  . ipratropium-albuterol (DUONEB) 0.5-2.5 (3) MG/3ML SOLN Take 3 mLs by nebulization every 6 (six) hours as needed (shortness of breath).    . labetalol (NORMODYNE) 200 MG tablet Take 1 tablet (200 mg total) by mouth 2 (two) times daily. 180 tablet 3  . levalbuterol (XOPENEX HFA) 45 MCG/ACT inhaler Inhale 2 puffs into the lungs every 6 (six) hours as needed for wheezing. 1 Inhaler 12  . levofloxacin (LEVAQUIN) 250 MG tablet Take 1 tablet (250 mg total) by mouth daily. 7 tablet 0  . loperamide (IMODIUM A-D) 2 MG tablet Take 2 mg by mouth as needed for diarrhea or loose stools.    . montelukast  (SINGULAIR) 10 MG tablet Take 1 tablet (10 mg total) by mouth at bedtime. 90 tablet 3  . NIFEdipine (PROCARDIA-XL/ADALAT-CC/NIFEDICAL-XL) 30 MG 24 hr tablet Take 1 tablet (30 mg total) by mouth every morning. 90 tablet 3  . omeprazole (PRILOSEC) 20 MG capsule Take 1 capsule (20 mg total) by mouth daily. 30 capsule 11   No current facility-administered medications on file prior to visit.   Review of Systems  pt unable due to dementia   Objective:   Physical Exam BP 140/76 mmHg  Pulse 72  Temp(Src) 97.7 F (36.5 C) (Oral)  SpO2 97% VS noted,  Constitutional: Pt appears well-developed, well-nourished.  HENT: Head: NCAT.  Right Ear: External ear normal.  Left Ear: External ear normal.  Eyes: . Pupils are equal, round, and reactive to light. Conjunctivae and EOM are normal Neck: Normal range of motion. Neck supple.  Cardiovascular: Normal rate and regular rhythm.   Pulmonary/Chest: Effort normal and breath sounds decreased BS bilat.  Abd:  Soft, NT, ND, + BS Neurological: Pt is alert. Not confused , motor grossly intact Skin: Skin is warm. No rash but has 10 mm area mild nontender erythema to high lumbar mid back, NT, no ulcer or drainage Psychiatric: Pt behavior is normal. No agitation.     Assessment & Plan:

## 2014-01-04 NOTE — Assessment & Plan Note (Addendum)
To change risperdal to seroquel 50 bid,  to f/u any worsening symptoms or concerns  Note:  Total time for pt hx, exam, review of record with pt in the room, determination of diagnoses and plan for further eval and tx is > 40 min, with over 50% spent in coordination and counseling of patient

## 2014-01-04 NOTE — Progress Notes (Signed)
Pre visit review using our clinic review tool, if applicable. No additional management support is needed unless otherwise documented below in the visit note. 

## 2014-01-04 NOTE — Patient Instructions (Signed)
The form should be filled out in 1 - 2 days  Please take all new medication as prescribed - the rescue inhaler as needed  Your Order to [facility] for the port Home O2  - 2L continuous is given today  OK to stop the risperdal  Please take all new medication as prescribed - the seroquel at lower dose  OK to use simple bandaid and neosporin for now to the back red area, but call if becomes a worsening sore, or draining, fever, or more pain/tenderness  Please return in 3 months, or sooner if needed

## 2014-01-04 NOTE — Assessment & Plan Note (Signed)
For rx to [facility] for Home port o2 - 2L continuous

## 2014-01-04 NOTE — Assessment & Plan Note (Signed)
stable overall by history and exam, recent data reviewed with pt, and pt to continue medical treatment as before,  to f/u any worsening symptoms or concerns, for alb MDI prn as well SpO2 Readings from Last 3 Encounters:  01/04/14 97%  12/20/13 100%  12/14/13 94%

## 2014-01-04 NOTE — Assessment & Plan Note (Signed)
stable overall by history and exam, recent data reviewed with pt, and pt to continue medical treatment as before,  to f/u any worsening symptoms or concerns BP Readings from Last 3 Encounters:  01/04/14 140/76  12/20/13 133/68  12/14/13 120/68

## 2014-01-05 ENCOUNTER — Telehealth: Payer: Self-pay | Admitting: *Deleted

## 2014-01-05 NOTE — Telephone Encounter (Signed)
i was simply trying to help pt get port O2, and was not the original prescriber of the Home O2.   I can do what i can, but if not adequate for insurance purpose, daughter will need to pursue this with the original prescriber of the Home o2 (per daughter this was done due to hypoxia /desaturation with exertion)  Done hardcopy to [employer]

## 2014-01-05 NOTE — Telephone Encounter (Signed)
Left msg on triage stating received request to get set-up up for continuous oxygen. We are needing the last 30 days saturation, also need respiratory dx for insurance to cover. Can fax to [phone]...Raechel Chute/lmb

## 2014-01-05 NOTE — Telephone Encounter (Signed)
Faxed ov notes 01/04/14 & 12/17/13 to fax # below.Marland Kitchen.Raechel Chute/lmb

## 2014-01-05 NOTE — Telephone Encounter (Signed)
Faxed order to number below

## 2014-01-06 ENCOUNTER — Telehealth: Payer: Self-pay

## 2014-01-06 NOTE — Telephone Encounter (Signed)
[facility] called to inform the patient is not eligible for home oxygen based on notes they received.  She stated her oxygen would need to be 88%.

## 2014-01-07 NOTE — Telephone Encounter (Signed)
[facility]

## 2014-01-10 ENCOUNTER — Encounter (HOSPITAL_COMMUNITY): Payer: Self-pay | Admitting: *Deleted

## 2014-01-10 ENCOUNTER — Inpatient Hospital Stay (HOSPITAL_COMMUNITY)
Admission: EM | Admit: 2014-01-10 | Discharge: 2014-01-13 | DRG: 190 | Disposition: A | Discharge status: Home / Self Care | Payer: Medicare HMO | Attending: Internal Medicine | Admitting: Internal Medicine

## 2014-01-10 ENCOUNTER — Emergency Department (HOSPITAL_COMMUNITY): Payer: Medicare HMO

## 2014-01-10 DIAGNOSIS — K219 Gastro-esophageal reflux disease without esophagitis: Secondary | ICD-10-CM | POA: Diagnosis present

## 2014-01-10 DIAGNOSIS — F0391 Unspecified dementia with behavioral disturbance: Secondary | ICD-10-CM | POA: Diagnosis present

## 2014-01-10 DIAGNOSIS — Z9981 Dependence on supplemental oxygen: Secondary | ICD-10-CM

## 2014-01-10 DIAGNOSIS — Z91013 Allergy to seafood: Secondary | ICD-10-CM

## 2014-01-10 DIAGNOSIS — I5030 Unspecified diastolic (congestive) heart failure: Secondary | ICD-10-CM | POA: Diagnosis present

## 2014-01-10 DIAGNOSIS — R21 Rash and other nonspecific skin eruption: Secondary | ICD-10-CM

## 2014-01-10 DIAGNOSIS — Z794 Long term (current) use of insulin: Secondary | ICD-10-CM

## 2014-01-10 DIAGNOSIS — J441 Chronic obstructive pulmonary disease with (acute) exacerbation: Principal | ICD-10-CM | POA: Diagnosis present

## 2014-01-10 DIAGNOSIS — Z7982 Long term (current) use of aspirin: Secondary | ICD-10-CM

## 2014-01-10 DIAGNOSIS — R296 Repeated falls: Secondary | ICD-10-CM

## 2014-01-10 DIAGNOSIS — R531 Weakness: Secondary | ICD-10-CM

## 2014-01-10 DIAGNOSIS — J449 Chronic obstructive pulmonary disease, unspecified: Secondary | ICD-10-CM

## 2014-01-10 DIAGNOSIS — I714 Abdominal aortic aneurysm, without rupture, unspecified: Secondary | ICD-10-CM

## 2014-01-10 DIAGNOSIS — I5032 Chronic diastolic (congestive) heart failure: Secondary | ICD-10-CM | POA: Diagnosis present

## 2014-01-10 DIAGNOSIS — J189 Pneumonia, unspecified organism: Secondary | ICD-10-CM

## 2014-01-10 DIAGNOSIS — F03918 Unspecified dementia, unspecified severity, with other behavioral disturbance: Secondary | ICD-10-CM

## 2014-01-10 DIAGNOSIS — R0600 Dyspnea, unspecified: Secondary | ICD-10-CM | POA: Diagnosis not present

## 2014-01-10 DIAGNOSIS — R350 Frequency of micturition: Secondary | ICD-10-CM

## 2014-01-10 DIAGNOSIS — R0682 Tachypnea, not elsewhere classified: Secondary | ICD-10-CM

## 2014-01-10 DIAGNOSIS — Z87891 Personal history of nicotine dependence: Secondary | ICD-10-CM

## 2014-01-10 DIAGNOSIS — D72829 Elevated white blood cell count, unspecified: Secondary | ICD-10-CM

## 2014-01-10 DIAGNOSIS — Z8673 Personal history of transient ischemic attack (TIA), and cerebral infarction without residual deficits: Secondary | ICD-10-CM

## 2014-01-10 DIAGNOSIS — Z515 Encounter for palliative care: Secondary | ICD-10-CM

## 2014-01-10 DIAGNOSIS — L03031 Cellulitis of right toe: Secondary | ICD-10-CM

## 2014-01-10 DIAGNOSIS — R0602 Shortness of breath: Secondary | ICD-10-CM

## 2014-01-10 DIAGNOSIS — E785 Hyperlipidemia, unspecified: Secondary | ICD-10-CM | POA: Diagnosis present

## 2014-01-10 DIAGNOSIS — H269 Unspecified cataract: Secondary | ICD-10-CM | POA: Diagnosis present

## 2014-01-10 DIAGNOSIS — J9601 Acute respiratory failure with hypoxia: Secondary | ICD-10-CM

## 2014-01-10 DIAGNOSIS — E119 Type 2 diabetes mellitus without complications: Secondary | ICD-10-CM

## 2014-01-10 DIAGNOSIS — G4734 Idiopathic sleep related nonobstructive alveolar hypoventilation: Secondary | ICD-10-CM

## 2014-01-10 DIAGNOSIS — J9621 Acute and chronic respiratory failure with hypoxia: Secondary | ICD-10-CM | POA: Diagnosis present

## 2014-01-10 DIAGNOSIS — F419 Anxiety disorder, unspecified: Secondary | ICD-10-CM | POA: Diagnosis present

## 2014-01-10 DIAGNOSIS — R440 Auditory hallucinations: Secondary | ICD-10-CM | POA: Diagnosis present

## 2014-01-10 DIAGNOSIS — I10 Essential (primary) hypertension: Secondary | ICD-10-CM | POA: Diagnosis present

## 2014-01-10 DIAGNOSIS — B370 Candidal stomatitis: Secondary | ICD-10-CM

## 2014-01-10 DIAGNOSIS — M8580 Other specified disorders of bone density and structure, unspecified site: Secondary | ICD-10-CM | POA: Diagnosis present

## 2014-01-10 DIAGNOSIS — M5431 Sciatica, right side: Secondary | ICD-10-CM

## 2014-01-10 DIAGNOSIS — Z66 Do not resuscitate: Secondary | ICD-10-CM | POA: Diagnosis present

## 2014-01-10 DIAGNOSIS — R0789 Other chest pain: Secondary | ICD-10-CM | POA: Diagnosis present

## 2014-01-10 DIAGNOSIS — I447 Left bundle-branch block, unspecified: Secondary | ICD-10-CM | POA: Diagnosis present

## 2014-01-10 DIAGNOSIS — J45909 Unspecified asthma, uncomplicated: Secondary | ICD-10-CM | POA: Diagnosis present

## 2014-01-10 DIAGNOSIS — F329 Major depressive disorder, single episode, unspecified: Secondary | ICD-10-CM | POA: Diagnosis present

## 2014-01-10 LAB — URINE MICROSCOPIC-ADD ON

## 2014-01-10 LAB — BASIC METABOLIC PANEL
Anion gap: 18 — ABNORMAL HIGH (ref 5–15)
BUN: 8 mg/dL (ref 6–23)
CO2: 19 mEq/L (ref 19–32)
Calcium: 8.9 mg/dL (ref 8.4–10.5)
Chloride: 103 mEq/L (ref 96–112)
Creatinine, Ser: 0.76 mg/dL (ref 0.50–1.10)
GFR calc Af Amer: 89 mL/min — ABNORMAL LOW (ref >90)
GFR calc non Af Amer: 77 mL/min — ABNORMAL LOW (ref >90)
Glucose, Bld: 84 mg/dL (ref 70–99)
Potassium: 4 mEq/L (ref 3.7–5.3)
Sodium: 140 mEq/L (ref 137–147)

## 2014-01-10 LAB — URINALYSIS, ROUTINE W REFLEX MICROSCOPIC
Bilirubin Urine: NEGATIVE
Glucose, UA: NEGATIVE mg/dL
Ketones, ur: NEGATIVE mg/dL
Nitrite: NEGATIVE
Protein, ur: NEGATIVE mg/dL
Specific Gravity, Urine: <1.005 — ABNORMAL LOW (ref 1.005–1.030)
Urobilinogen, UA: 0.2 mg/dL (ref 0.0–1.0)
pH: 6 (ref 5.0–8.0)

## 2014-01-10 LAB — TROPONIN I
Troponin I: <0.3 ng/mL (ref <0.30)
Troponin I: <0.3 ng/mL (ref <0.30)

## 2014-01-10 LAB — CBC WITH DIFFERENTIAL/PLATELET
Basophils Absolute: 0 10*3/uL (ref 0.0–0.1)
Basophils Relative: 0 % (ref 0–1)
Eosinophils Absolute: 0.3 10*3/uL (ref 0.0–0.7)
Eosinophils Relative: 3 % (ref 0–5)
HCT: 36.1 % (ref 36.0–46.0)
Hemoglobin: 12 g/dL (ref 12.0–15.0)
Lymphocytes Relative: 21 % (ref 12–46)
Lymphs Abs: 2.1 10*3/uL (ref 0.7–4.0)
MCH: 29.3 pg (ref 26.0–34.0)
MCHC: 33.2 g/dL (ref 30.0–36.0)
MCV: 88.3 fL (ref 78.0–100.0)
Monocytes Absolute: 0.8 10*3/uL (ref 0.1–1.0)
Monocytes Relative: 8 % (ref 3–12)
Neutro Abs: 6.8 10*3/uL (ref 1.7–7.7)
Neutrophils Relative %: 68 % (ref 43–77)
Platelets: 336 10*3/uL (ref 150–400)
RBC: 4.09 MIL/uL (ref 3.87–5.11)
RDW: 13.7 % (ref 11.5–15.5)
WBC: 10 10*3/uL (ref 4.0–10.5)

## 2014-01-10 LAB — GLUCOSE, CAPILLARY
Glucose-Capillary: 125 mg/dL — ABNORMAL HIGH (ref 70–99)
Glucose-Capillary: 129 mg/dL — ABNORMAL HIGH (ref 70–99)
Glucose-Capillary: 175 mg/dL — ABNORMAL HIGH (ref 70–99)

## 2014-01-10 LAB — PRO B NATRIURETIC PEPTIDE: Pro B Natriuretic peptide (BNP): 631.1 pg/mL — ABNORMAL HIGH (ref 0–450)

## 2014-01-10 MED ORDER — ONDANSETRON HCL 4 MG/2ML IJ SOLN
4.0000 mg | Freq: Four times a day (QID) | INTRAMUSCULAR | Status: DC | PRN
Start: 2014-01-10 — End: 2014-01-13

## 2014-01-10 MED ORDER — QUETIAPINE FUMARATE 50 MG PO TABS
50.0000 mg | ORAL_TABLET | Freq: Every day | ORAL | Status: DC
  Administered 2014-01-10 – 2014-01-12 (×3): 50 mg via ORAL
  Filled 2014-01-10 (×4): qty 1

## 2014-01-10 MED ORDER — IPRATROPIUM-ALBUTEROL 0.5-2.5 (3) MG/3ML IN SOLN
3.0000 mL | RESPIRATORY_TRACT | Status: DC
  Administered 2014-01-10 (×2): 3 mL via RESPIRATORY_TRACT
  Filled 2014-01-10 (×2): qty 3

## 2014-01-10 MED ORDER — NIFEDIPINE ER 30 MG PO TB24
30.0000 mg | ORAL_TABLET | Freq: Every morning | ORAL | Status: DC
  Administered 2014-01-11 – 2014-01-13 (×3): 30 mg via ORAL
  Filled 2014-01-10 (×3): qty 1

## 2014-01-10 MED ORDER — FUROSEMIDE 10 MG/ML IJ SOLN
40.0000 mg | Freq: Once | INTRAMUSCULAR | Status: AC
  Administered 2014-01-10: 40 mg via INTRAVENOUS
  Filled 2014-01-10: qty 4

## 2014-01-10 MED ORDER — ACETAMINOPHEN 325 MG PO TABS: 650.0000 mg | ORAL_TABLET | Freq: Four times a day (QID) | ORAL | Status: DC | PRN

## 2014-01-10 MED ORDER — SODIUM CHLORIDE 0.9 % IJ SOLN
3.0000 mL | Freq: Two times a day (BID) | INTRAMUSCULAR | Status: DC
  Administered 2014-01-10 – 2014-01-13 (×6): 3 mL via INTRAVENOUS

## 2014-01-10 MED ORDER — ALBUTEROL SULFATE (2.5 MG/3ML) 0.083% IN NEBU: 2.5000 mg | INHALATION_SOLUTION | RESPIRATORY_TRACT | Status: DC | PRN

## 2014-01-10 MED ORDER — METHYLPREDNISOLONE SODIUM SUCC 40 MG IJ SOLR
40.0000 mg | Freq: Two times a day (BID) | INTRAMUSCULAR | Status: DC
  Administered 2014-01-10 – 2014-01-11 (×2): 40 mg via INTRAVENOUS
  Filled 2014-01-10 (×4): qty 1

## 2014-01-10 MED ORDER — MONTELUKAST SODIUM 10 MG PO TABS
10.0000 mg | ORAL_TABLET | Freq: Every day | ORAL | Status: DC
  Administered 2014-01-10 – 2014-01-12 (×3): 10 mg via ORAL
  Filled 2014-01-10 (×4): qty 1

## 2014-01-10 MED ORDER — FLUTICASONE PROPIONATE 50 MCG/ACT NA SUSP
2.0000 | Freq: Every day | NASAL | Status: DC | PRN
  Filled 2014-01-10: qty 16

## 2014-01-10 MED ORDER — METHYLPREDNISOLONE SODIUM SUCC 40 MG IJ SOLR
40.0000 mg | Freq: Two times a day (BID) | INTRAMUSCULAR | Status: DC
Start: 2014-01-10 — End: 2014-01-10
  Filled 2014-01-10: qty 1

## 2014-01-10 MED ORDER — LABETALOL HCL 200 MG PO TABS
200.0000 mg | ORAL_TABLET | Freq: Two times a day (BID) | ORAL | Status: DC
  Administered 2014-01-10 – 2014-01-13 (×5): 200 mg via ORAL
  Filled 2014-01-10 (×7): qty 1

## 2014-01-10 MED ORDER — ASPIRIN 81 MG PO TBEC: 81.0000 mg | DELAYED_RELEASE_TABLET | Freq: Every morning | ORAL | Status: DC

## 2014-01-10 MED ORDER — ONDANSETRON HCL 4 MG PO TABS: 4.0000 mg | ORAL_TABLET | Freq: Four times a day (QID) | ORAL | Status: DC | PRN

## 2014-01-10 MED ORDER — INSULIN ASPART 100 UNIT/ML ~~LOC~~ SOLN: 0.0000 [IU] | Freq: Every day | SUBCUTANEOUS | Status: DC

## 2014-01-10 MED ORDER — IOHEXOL 350 MG/ML SOLN
100.0000 mL | Freq: Once | INTRAVENOUS | Status: AC | PRN
  Administered 2014-01-10: 100 mL via INTRAVENOUS

## 2014-01-10 MED ORDER — INSULIN ASPART 100 UNIT/ML ~~LOC~~ SOLN
0.0000 [IU] | Freq: Three times a day (TID) | SUBCUTANEOUS | Status: DC
  Administered 2014-01-11: 2 [IU] via SUBCUTANEOUS
  Administered 2014-01-11: 1 [IU] via SUBCUTANEOUS
  Administered 2014-01-11: 2 [IU] via SUBCUTANEOUS
  Administered 2014-01-12 (×2): 1 [IU] via SUBCUTANEOUS
  Administered 2014-01-13: 2 [IU] via SUBCUTANEOUS
  Administered 2014-01-13: 1 [IU] via SUBCUTANEOUS

## 2014-01-10 MED ORDER — GUAIFENESIN ER 600 MG PO TB12
600.0000 mg | ORAL_TABLET | Freq: Two times a day (BID) | ORAL | Status: DC
  Administered 2014-01-10 – 2014-01-13 (×6): 600 mg via ORAL
  Filled 2014-01-10 (×7): qty 1

## 2014-01-10 MED ORDER — ASPIRIN EC 81 MG PO TBEC
81.0000 mg | DELAYED_RELEASE_TABLET | Freq: Every day | ORAL | Status: DC
  Administered 2014-01-11 – 2014-01-13 (×3): 81 mg via ORAL
  Filled 2014-01-10 (×3): qty 1

## 2014-01-10 MED ORDER — FUROSEMIDE 20 MG PO TABS
20.0000 mg | ORAL_TABLET | Freq: Every day | ORAL | Status: DC
  Administered 2014-01-11 – 2014-01-13 (×3): 20 mg via ORAL
  Filled 2014-01-10 (×3): qty 1

## 2014-01-10 MED ORDER — DONEPEZIL HCL 10 MG PO TABS
10.0000 mg | ORAL_TABLET | Freq: Every day | ORAL | Status: DC
Start: 2014-01-11 — End: 2014-01-13
  Administered 2014-01-11 – 2014-01-13 (×3): 10 mg via ORAL
  Filled 2014-01-10 (×3): qty 1

## 2014-01-10 MED ORDER — ACETAMINOPHEN 650 MG RE SUPP
650.0000 mg | Freq: Four times a day (QID) | RECTAL | Status: DC | PRN
Start: 2014-01-10 — End: 2014-01-13

## 2014-01-10 MED ORDER — CITALOPRAM HYDROBROMIDE 20 MG PO TABS
20.0000 mg | ORAL_TABLET | Freq: Every morning | ORAL | Status: DC
  Administered 2014-01-11 – 2014-01-13 (×3): 20 mg via ORAL
  Filled 2014-01-10 (×3): qty 1

## 2014-01-10 MED ORDER — PANTOPRAZOLE SODIUM 40 MG PO TBEC
40.0000 mg | DELAYED_RELEASE_TABLET | Freq: Every day | ORAL | Status: DC
  Administered 2014-01-11 – 2014-01-13 (×3): 40 mg via ORAL
  Filled 2014-01-10 (×2): qty 1

## 2014-01-10 MED ORDER — PREDNISONE 20 MG PO TABS: 50.0000 mg | ORAL_TABLET | Freq: Every day | ORAL | Status: DC

## 2014-01-10 MED ORDER — IPRATROPIUM-ALBUTEROL 0.5-2.5 (3) MG/3ML IN SOLN
3.0000 mL | Freq: Four times a day (QID) | RESPIRATORY_TRACT | Status: DC
  Administered 2014-01-10 – 2014-01-11 (×5): 3 mL via RESPIRATORY_TRACT
  Filled 2014-01-10 (×5): qty 3

## 2014-01-10 MED ORDER — DOXYCYCLINE HYCLATE 100 MG PO TABS
100.0000 mg | ORAL_TABLET | Freq: Two times a day (BID) | ORAL | Status: DC
  Administered 2014-01-10 – 2014-01-13 (×6): 100 mg via ORAL
  Filled 2014-01-10 (×7): qty 1

## 2014-01-10 MED ORDER — FAMOTIDINE 40 MG PO TABS
40.0000 mg | ORAL_TABLET | Freq: Every day | ORAL | Status: DC
  Administered 2014-01-11 – 2014-01-13 (×3): 40 mg via ORAL
  Filled 2014-01-10 (×3): qty 1

## 2014-01-10 MED ORDER — GUAIFENESIN-DM 100-10 MG/5ML PO SYRP
5.0000 mL | ORAL_SOLUTION | ORAL | Status: DC | PRN
Start: 2014-01-10 — End: 2014-01-13
  Filled 2014-01-10: qty 5

## 2014-01-10 MED ORDER — ENOXAPARIN SODIUM 40 MG/0.4ML ~~LOC~~ SOLN
40.0000 mg | SUBCUTANEOUS | Status: DC
  Administered 2014-01-10 – 2014-01-12 (×3): 40 mg via SUBCUTANEOUS
  Filled 2014-01-10 (×4): qty 0.4

## 2014-01-10 NOTE — ED Notes (Signed)
Patient returned from X-ray 

## 2014-01-10 NOTE — ED Notes (Signed)
Patient's Pulse Ox during walking dropped to 94% while walking. Patient complained of dizziness, trouble walking, and was tachypnic. Patient placed back on the cardiac monitor.

## 2014-01-10 NOTE — H&P (Addendum)
History and Physical  Alicia Kent:[id number] DOB: Feb 23, 1931 DOA: 01/10/2014  Referring physician: Dr. Raeford Razor, EDP PCP: Oliver Barre, MD  Outpatient Specialists:  1. Not known.  Chief Complaint: dyspnea  HPI: Alicia Kent is a 80 y.o. female with history of COPD, essential hypertension, chronic diastolic CHF, hyperlipidemia, former heavy smoker, GERD, LBBB, dementia with behavioral disturbances, depression, chronic respiratory failure on home oxygen 2 L/m continuously, DM 2, recently hospitalized 10/15-10/19 for dyspnea which was attributed to combination of COPD exacerbation and decompensated CHF. History is obtained from patient and patient's daughter Alicia Kent via phone. Patient apparently was in her usual state of health until sometime last night when she started having worsening dyspnea. She usually has some degree of dyspnea on exertion. This was associated with cough with creamy sputum. She denied fever or chills. She denied wheezing. She apparently complained of some intermittent chest pain in the ED early on but denies it currently. As per daughter, patient's mental status has been progressively declining from her dementia and she has ongoing visual and auditory hallucinations where she speaks to people who are not around or are demised. She sees children who are not around. She gets paranoid at times - feels like her son-in-law is out to kill her. She gets occasionally agitated. She does have sundowning. In the ED, Lab work was unremarkable including troponin  2 negative and proBNP 631. CTA chest was negative for PE but showed emphysema and pulmonary nodules. Patient has received nebulizations and a dose of IV Lasix. She states that her dyspnea is much better. Hospitalist admission has been requested.    Review of Systems: All systems reviewed and apart from history of presenting illness, are negative.  Past Medical History  Diagnosis Date  . Diabetes mellitus type II     . Hyperlipidemia   . HTN (hypertension)   . GERD (gastroesophageal reflux disease)   . Allergic rhinitis   . Depression   . Dementia     mild  . History of CVA (cerebrovascular accident)   . LBBB (left bundle branch block)   . AAA (abdominal aortic aneurysm)     small, last check in 05/2009, due for repeat in 05/2010  . Carotid stenosis 09/2009    40-59% RICA, 0-39% LICA  . LVH (left ventricular hypertrophy) 10/2009    Moderate, EF 55-60%, no regional wall motion abnormalities  . Shellfish allergy     anaphylaxis  . Osteopenia   . Vitamin D deficiency   . Fracture of rib of left side 12/2009  . Asthma 01/27/2011  . Diastolic CHF 08/02/2011  . AAA (abdominal aortic aneurysm) without rupture 07/16/2013  . Cataract    Past Surgical History  Procedure Laterality Date  . Ankle surgery      Right, by Ortho in [place]  . Tracheostomy  2010    anaphylactic episode on vent  . Tubal ligation    . Joint replacement Left     knee   Social History:  reports that she quit smoking about 3 years ago. She does not have any smokeless tobacco history on file. She reports that she does not drink alcohol or use illicit drugs. Widowed. Lives with her daughter. States that she started smoking at age 64 and quit 3 years ago. At her peak she smoked a pack of cigarettes every 2 days. She denies alcohol or drug abuse.  Allergies  Allergen Reactions  . Ibuprofen Anaphylaxis  . Shellfish-Derived Products Anaphylaxis  Swelling tongue and throat  . Ace Inhibitors Other (See Comments)    unknown  . Sulfa Antibiotics Nausea And Vomiting    Family History  Problem Relation Age of Onset  . Rheum arthritis Sister   . Stroke Brother   . Hypertension Brother   . Diabetes type II Other     2 siblings    Prior to Admission medications   Medication Sig Start Date End Date Taking? Authorizing Provider  albuterol (PROVENTIL HFA;VENTOLIN HFA) 108 (90 BASE) MCG/ACT inhaler Inhale 2 puffs into the lungs  every 6 (six) hours as needed for wheezing or shortness of breath. 01/04/14  Yes Corwin LevinsJames W John, MD  aspirin 81 MG EC tablet Take 81 mg by mouth every morning.    Yes Historical Provider, MD  citalopram (CELEXA) 20 MG tablet Take 1 tablet (20 mg total) by mouth every morning. 12/14/13  Yes Corwin LevinsJames W John, MD  donepezil (ARICEPT) 10 MG tablet Take 1 tablet (10 mg total) by mouth daily. 12/14/13  Yes Corwin LevinsJames W John, MD  famotidine (PEPCID) 40 MG tablet Take 40 mg by mouth daily.   Yes Historical Provider, MD  fluticasone (FLONASE) 50 MCG/ACT nasal spray Place 2 sprays into both nostrils daily. Patient taking differently: Place 2 sprays into both nostrils daily as needed for allergies.  12/20/13  Yes Richarda OverlieNayana Abrol, MD  furosemide (LASIX) 20 MG tablet Take 1 tablet (20 mg total) by mouth daily. 12/20/13  Yes Richarda OverlieNayana Abrol, MD  ipratropium-albuterol (DUONEB) 0.5-2.5 (3) MG/3ML SOLN Take 3 mLs by nebulization every 6 (six) hours as needed (shortness of breath).   Yes Historical Provider, MD  labetalol (NORMODYNE) 200 MG tablet Take 1 tablet (200 mg total) by mouth 2 (two) times daily. 12/14/13  Yes Corwin LevinsJames W John, MD  montelukast (SINGULAIR) 10 MG tablet Take 1 tablet (10 mg total) by mouth at bedtime. 12/14/13  Yes Corwin LevinsJames W John, MD  NIFEdipine (PROCARDIA-XL/ADALAT-CC/NIFEDICAL-XL) 30 MG 24 hr tablet Take 1 tablet (30 mg total) by mouth every morning. 03/25/13  Yes Corwin LevinsJames W John, MD  omeprazole (PRILOSEC) 20 MG capsule Take 1 capsule (20 mg total) by mouth daily. 04/09/13  Yes Corwin LevinsJames W John, MD  QUEtiapine (SEROQUEL) 50 MG tablet Take 1 tablet (50 mg total) by mouth at bedtime. 01/04/14  Yes Corwin LevinsJames W John, MD  insulin glargine (LANTUS) 100 UNIT/ML injection Inject 0.25 mLs (25 Units total) into the skin at bedtime. 03/25/13   Corwin LevinsJames W John, MD  Insulin Pen Needle (B-D UF III MINI PEN NEEDLES) 31G X 5 MM MISC Use as directed for insulin.  Diagnosis code 250.00 04/01/13   Corwin LevinsJames W John, MD  levalbuterol [facility](XOPENEX HFA) 45 MCG/ACT  inhaler Inhale 2 puffs into the lungs every 6 (six) hours as needed for wheezing. 12/20/13   Richarda OverlieNayana Abrol, MD  loperamide (IMODIUM A-D) 2 MG tablet Take 2 mg by mouth as needed for diarrhea or loose stools.    Historical Provider, MD   Physical Exam: Filed Vitals:   01/10/14 1545 01/10/14 1548 01/10/14 1600 01/10/14 1615  BP: 135/67 135/67 167/70 136/48  Pulse: 66 69 68 82  Temp:      TempSrc:      Resp:  18 18 20   SpO2: 100% 99% 89% 100%  temperature: 98.9F.   General exam: Moderately built and nourished pleasant elderly female patient, sitting propped up on the gurney with intermittent tachypnea. Appears slightly anxious.  Head, eyes and ENT: Nontraumatic and normocephalic. Pupils equally reacting to light  and accommodation. Oral mucosa moist.  Neck: Supple. No JVD, carotid bruit or thyromegaly.  Lymphatics: No lymphadenopathy.  Respiratory system: globally diminished breath sounds but no obvious wheezing or rhonchi. Few basal crackles. Mildly tachypnea but able to speak in full sentences.  Cardiovascular system: S1 and S2 heard, RRR. No JVD, murmurs, gallops, clicks or pedal edema. No sacral edema. Telemetry: Sinus rhythm.  Gastrointestinal system: Abdomen is nondistended, soft and nontender. Normal bowel sounds heard. No organomegaly or masses appreciated. Fading superficial ecchymotic patches RLQ-likely from Lovenox DVT prophylaxis during previous admission.  Central nervous system: Alert and oriented2. No focal neurological deficits.  Extremities: Symmetric 5 x 5 power. Peripheral pulses symmetrically felt.   Skin: No rashes or acute findings.  Musculoskeletal system: Negative exam.  Psychiatry: Pleasant and cooperative. Currently without active hallucinations or delusions.   Labs on Admission:  Basic Metabolic Panel:  Recent Labs Lab 01/10/14 1052  NA 140  K 4.0  CL 103  CO2 19  GLUCOSE 84  BUN 8  CREATININE 0.76  CALCIUM 8.9   Liver Function Tests: No  results for input(s): AST, ALT, ALKPHOS, BILITOT, PROT, ALBUMIN in the last 168 hours. No results for input(s): LIPASE, AMYLASE in the last 168 hours. No results for input(s): AMMONIA in the last 168 hours. CBC:  Recent Labs Lab 01/10/14 1052  WBC 10.0  NEUTROABS 6.8  HGB 12.0  HCT 36.1  MCV 88.3  PLT 336   Cardiac Enzymes:  Recent Labs Lab 01/10/14 1052 01/10/14 1521  TROPONINI <0.30 <0.30    BNP (last 3 results)  Recent Labs  12/16/13 1943 01/10/14 1049  PROBNP 639.0* 631.1*   CBG: No results for input(s): GLUCAP in the last 168 hours.  Radiological Exams on Admission: Ct Angio Chest Pe W/cm &/or Wo Cm  01/10/2014   CLINICAL DATA:  Acute dyspnea, COPD  EXAM: CT ANGIOGRAPHY CHEST WITH CONTRAST  TECHNIQUE: Multidetector CT imaging of the chest was performed using the standard protocol during bolus administration of intravenous contrast. Multiplanar CT image reconstructions and MIPs were obtained to evaluate the vascular anatomy.  CONTRAST:  OMNIPAQUE IOHEXOL 350 MG/ML SOLN  COMPARISON:  05/24/2012  FINDINGS: Limited exam with respiratory motion artifact. This limits assessment for small peripheral segmental or subsegmental emboli. Within the limits of study, there is no significant central or proximal hilar filling defect or pulmonary embolus. No evidence of CT right heart strain.  Atherosclerosis noted of the elongated ectatic thoracic aorta. There is mural thickening noted of the descending thoracic aorta as before. No adenopathy. Coronary calcifications noted. No pericardial or pleural effusion. Small hiatal hernia evident.  Lung windows demonstrated hyperinflation with upper lobe predominant mild centrilobular emphysema. No pneumothorax. Small peripheral subpleural left upper lobe 7 mm pulmonary nodule evident, image 24. This was not clearly evident on the comparison study from 05/24/2012. Bibasilar dependent atelectasis evident. No focal pneumonia, collapse or  consolidation. No acute airspace process, interstitial edema, or interstitial lung disease. Trachea and central airways are appear patent.  Included upper abdomen demonstrates no acute finding.  Degenerative changes of the thoracic spine diffusely. Chronic appearing T12 compression fracture  Review of the MIP images confirms the above findings.  IMPRESSION: Limited with motion artifact but no significant central or proximal hilar acute pulmonary embolus.  Stable emphysema  Sub mm left upper lobe and subpleural nodule  If the patient is at high risk for bronchogenic carcinoma, follow-up chest CT at 3-23months is recommended. If the patient is at low risk for  bronchogenic carcinoma, follow-up chest CT at 6-12 months is recommended. This recommendation follows the consensus statement: Guidelines for Management of Small Pulmonary Nodules Detected on CT Scans: A Statement from the Fleischner Society as published in Radiology 2005; 237:395-400.   Electronically Signed   By: Ruel Favorsrevor  Shick M.D.   On: 01/10/2014 13:53   Dg Chest Portable 1 View  01/10/2014   CLINICAL DATA:  Cough, congestion, wet breathing sounds, weakness, LEFT side chest pain earlier, past history of hypertension, type 2 diabetes, stroke, asthma, diastolic CHF  EXAM: PORTABLE CHEST - 1 VIEW  COMPARISON:  Portable exam 1056 hr compared to 12/18/2013  FINDINGS: Normal heart size and pulmonary vascularity.  Calcified tortuous thoracic aorta.  Bronchitic changes with question underlying emphysematous changes.  No acute infiltrate, pleural effusion, or pneumothorax.  Skin folds project over the chest bilaterally.  Bones demineralized.  IMPRESSION: Bronchitic and question emphysematous changes.  No acute abnormalities.   Electronically Signed   By: Ulyses SouthwardMark  Boles M.D.   On: 01/10/2014 11:17    EKG: Independently reviewed. Sinus rhythm 72 bpm, LBBB (not new), LAD. T wave inversions in inferior leads.  Assessment/Plan Active Problems:   Dementia with  behavioral disturbance   Essential hypertension   Diastolic CHF   COPD exacerbation   Diabetes mellitus type 2, controlled   Dyspnea   Atypical chest pain   1. COPD exacerbation: Admit to telemetry. Treat with very short course of IV Solu-Medrol, oxygen, bronchodilator nebulization's and oral doxycycline. 2. Acute on chronic hypoxic respiratory failure: Initially had oxygen saturation of 88% in the ED. This is secondary to COPD exacerbation and some element of anxiety. Her CHF seems to be compensated. Treatment as per problem #1 and monitor. Improving. 3. Atypical chest pain: History unreliable. Chest pain currently resolved. May be secondary to problem #1. EKG without acute changes. Troponins 2 negative. Cycle troponins and monitor closely. Continue aspirin. 4. Chronic diastolic CHF: Clinically seems to be compensated. Of note, patient received a dose of IV Lasix in the ED.Continue home dose Lasix from tomorrow. 5. Essential hypertension: Controlled. Continue home medications - labetalol and nifedipine XL. 6. Type II DM: Patient has not taken Lantus since 11/7. Blood glucose within normal range currently. Hold Lantus. CBG is likely to rise from Solu-Medrol. Will treat with SSI for now. 7. Dementia with behavioral disturbances/Depression: at risk for in-hospital delirium related to hospitalization, acute illness, sundowning, steroids. Continue Aricept, Seroquel, Celexa and monitor closely. Early DC to home would help. 8. History of GERD: Continue PPI and Pepcid. 9. Pulmonary nodules: CTA chest showed Sub mm left upper lobe and subpleural nodule - follow-up CT chest in 3-6 months as per recommendations.    Code Status: DO NOT RESUSCITATE-confirmed with patient's daughter.  Family Communication: discussed with patient's daughter Ms. Emmie Niemannracy Young.  Disposition Plan: home when medically stable.   Time spent:  65 minutes  Shaundra Fullam, MD, FACP, FHM. [facility] Pager [phone]  If  7PM-7AM, please contact night-coverage [email or web address] Password [email or web address] 01/10/2014, 4:43 PM

## 2014-01-10 NOTE — ED Notes (Signed)
pts cbg 125

## 2014-01-10 NOTE — ED Notes (Signed)
Rt paged and at bedside.

## 2014-01-10 NOTE — ED Provider Notes (Signed)
CSN: [id number]     Arrival date & time 01/10/14  1017 History   First MD Initiated Contact with Patient 01/10/14 1022     Chief Complaint  Patient presents with  . Shortness of Breath     (Consider location/radiation/quality/duration/timing/severity/associated sxs/prior Treatment) HPI   80yF brought in by daughter for evaluation of SOB. Onset last night and worsening through the morning. Pt getting up last night saying she couldn't breath. Family noticed that breathing very fast at rest. Pt denies pain. Just tells me she feels "lousy." Does endorse SOB when specifically asked. Denies pain anywhere aside from "my butt" from the uncomfortable stretcher. No fever or chills. Occasional cough. No unusual swelling.  Recently admitted 10/15-10/19 with dyspnea felt to be combination of acute COPD exacerbation acute on chronic diastolic HF.  2-D echo showed EF of 55-60% with grade 1 diastolic dysfunction. D-dimer elevated, negative VQ scan and venous Doppler. Started on Flonase, Xopenex, Spiriva, Lasix prior to discharge. Apparently has been compliant. On 2L home oxygen.   Past Medical History  Diagnosis Date  . Diabetes mellitus type II   . Hyperlipidemia   . HTN (hypertension)   . GERD (gastroesophageal reflux disease)   . Allergic rhinitis   . Depression   . Dementia     mild  . History of CVA (cerebrovascular accident)   . LBBB (left bundle branch block)   . AAA (abdominal aortic aneurysm)     small, last check in 05/2009, due for repeat in 05/2010  . Carotid stenosis 09/2009    40-59% RICA, 0-39% LICA  . LVH (left ventricular hypertrophy) 10/2009    Moderate, EF 55-60%, no regional wall motion abnormalities  . Shellfish allergy     anaphylaxis  . Osteopenia   . Vitamin D deficiency   . Fracture of rib of left side 12/2009  . Asthma 01/27/2011  . Diastolic CHF 08/02/2011  . AAA (abdominal aortic aneurysm) without rupture 07/16/2013  . Cataract    Past Surgical History   Procedure Laterality Date  . Ankle surgery      Right, [place]  . Tracheostomy  2010    anaphylactic episode on vent  . Tubal ligation    . Joint replacement Left     knee   Family History  Problem Relation Age of Onset  . Rheum arthritis Sister   . Stroke Brother   . Hypertension Brother   . Diabetes type II Other     2 siblings   History  Substance Use Topics  . Smoking status: Former Smoker    Quit date: 12/17/2010  . Smokeless tobacco: Not on file     Comment: 3/4 ppd  . Alcohol Use: No   OB History    No data available     Review of Systems  All systems reviewed and negative, other than as noted in HPI.   Allergies  Ibuprofen; Shellfish-derived products; Ace inhibitors; and Sulfa antibiotics  Home Medications   Prior to Admission medications   Medication Sig Start Date End Date Taking? Authorizing Provider  albuterol (PROVENTIL HFA;VENTOLIN HFA) 108 (90 BASE) MCG/ACT inhaler Inhale 2 puffs into the lungs every 6 (six) hours as needed for wheezing or shortness of breath. 01/04/14  Yes Corwin LevinsJames W John, MD  aspirin 81 MG EC tablet Take 81 mg by mouth every morning.    Yes Historical Provider, MD  citalopram (CELEXA) 20 MG tablet Take 1 tablet (20 mg total) by mouth every morning. 12/14/13  Yes Corwin Levins, MD  donepezil (ARICEPT) 10 MG tablet Take 1 tablet (10 mg total) by mouth daily. 12/14/13  Yes Corwin Levins, MD  famotidine (PEPCID) 40 MG tablet Take 40 mg by mouth daily.   Yes Historical Provider, MD  fluticasone (FLONASE) 50 MCG/ACT nasal spray Place 2 sprays into both nostrils daily. Patient taking differently: Place 2 sprays into both nostrils daily as needed for allergies.  12/20/13  Yes Richarda Overlie, MD  furosemide (LASIX) 20 MG tablet Take 1 tablet (20 mg total) by mouth daily. 12/20/13  Yes Richarda Overlie, MD  ipratropium-albuterol (DUONEB) 0.5-2.5 (3) MG/3ML SOLN Take 3 mLs by nebulization every 6 (six) hours as needed (shortness of breath).   Yes  Historical Provider, MD  labetalol (NORMODYNE) 200 MG tablet Take 1 tablet (200 mg total) by mouth 2 (two) times daily. 12/14/13  Yes Corwin Levins, MD  montelukast (SINGULAIR) 10 MG tablet Take 1 tablet (10 mg total) by mouth at bedtime. 12/14/13  Yes Corwin Levins, MD  NIFEdipine (PROCARDIA-XL/ADALAT-CC/NIFEDICAL-XL) 30 MG 24 hr tablet Take 1 tablet (30 mg total) by mouth every morning. 03/25/13  Yes Corwin Levins, MD  omeprazole (PRILOSEC) 20 MG capsule Take 1 capsule (20 mg total) by mouth daily. 04/09/13  Yes Corwin Levins, MD  QUEtiapine (SEROQUEL) 50 MG tablet Take 1 tablet (50 mg total) by mouth at bedtime. 01/04/14  Yes Corwin Levins, MD  insulin glargine (LANTUS) 100 UNIT/ML injection Inject 0.25 mLs (25 Units total) into the skin at bedtime. 03/25/13   Corwin Levins, MD  Insulin Pen Needle (B-D UF III MINI PEN NEEDLES) 31G X 5 MM MISC Use as directed for insulin.  Diagnosis code 250.00 04/01/13   Corwin Levins, MD  levalbuterol [facility] HFA) 45 MCG/ACT inhaler Inhale 2 puffs into the lungs every 6 (six) hours as needed for wheezing. 12/20/13   Richarda Overlie, MD  levofloxacin (LEVAQUIN) 250 MG tablet Take 1 tablet (250 mg total) by mouth daily. 12/24/13   Corwin Levins, MD  loperamide (IMODIUM A-D) 2 MG tablet Take 2 mg by mouth as needed for diarrhea or loose stools.    Historical Provider, MD   BP 133/54 mmHg  Pulse 74  Temp(Src) 98.1 F (36.7 C) (Oral)  Resp 18  SpO2 100% Physical Exam  Constitutional: She appears well-developed.  HENT:  Head: Normocephalic and atraumatic.  Eyes: Conjunctivae and EOM are normal.  Anisocoria. R pupil slightly larger than L. Family reports this is baseline. Both reactive.    Neck: Normal range of motion. Neck supple.  Cardiovascular: Normal rate.   Pulmonary/Chest:  Tachypnea. Faint expiratory wheezing b/l.   Abdominal: Soft. She exhibits no distension. There is no tenderness.  Musculoskeletal:  Lower extremities symmetric as compared to each other. No calf  tenderness. Negative Homan's. No palpable cords.   Neurological: She is alert.  Disoriented to place/time  Skin: Skin is warm and dry. She is not diaphoretic.  Nursing note and vitals reviewed.   ED Course  Procedures (including critical care time) Labs Review Labs Reviewed  BASIC METABOLIC PANEL - Abnormal; Notable for the following:    GFR calc non Af Amer 77 (*)    GFR calc Af Amer 89 (*)    Anion gap 18 (*)    All other components within normal limits  PRO B NATRIURETIC PEPTIDE - Abnormal; Notable for the following:    Pro B Natriuretic peptide (BNP) 631.1 (*)    All  other components within normal limits  CBC WITH DIFFERENTIAL  TROPONIN I  URINALYSIS, ROUTINE W REFLEX MICROSCOPIC  TROPONIN I    Imaging Review Ct Angio Chest Pe W/cm &/or Wo Cm  01/10/2014   CLINICAL DATA:  Acute dyspnea, COPD  EXAM: CT ANGIOGRAPHY CHEST WITH CONTRAST  TECHNIQUE: Multidetector CT imaging of the chest was performed using the standard protocol during bolus administration of intravenous contrast. Multiplanar CT image reconstructions and MIPs were obtained to evaluate the vascular anatomy.  CONTRAST:  OMNIPAQUE IOHEXOL 350 MG/ML SOLN  COMPARISON:  05/24/2012  FINDINGS: Limited exam with respiratory motion artifact. This limits assessment for small peripheral segmental or subsegmental emboli. Within the limits of study, there is no significant central or proximal hilar filling defect or pulmonary embolus. No evidence of CT right heart strain.  Atherosclerosis noted of the elongated ectatic thoracic aorta. There is mural thickening noted of the descending thoracic aorta as before. No adenopathy. Coronary calcifications noted. No pericardial or pleural effusion. Small hiatal hernia evident.  Lung windows demonstrated hyperinflation with upper lobe predominant mild centrilobular emphysema. No pneumothorax. Small peripheral subpleural left upper lobe 7 mm pulmonary nodule evident, image 24. This was not  clearly evident on the comparison study from 05/24/2012. Bibasilar dependent atelectasis evident. No focal pneumonia, collapse or consolidation. No acute airspace process, interstitial edema, or interstitial lung disease. Trachea and central airways are appear patent.  Included upper abdomen demonstrates no acute finding.  Degenerative changes of the thoracic spine diffusely. Chronic appearing T12 compression fracture  Review of the MIP images confirms the above findings.  IMPRESSION: Limited with motion artifact but no significant central or proximal hilar acute pulmonary embolus.  Stable emphysema  Sub mm left upper lobe and subpleural nodule  If the patient is at high risk for bronchogenic carcinoma, follow-up chest CT at 3-49months is recommended. If the patient is at low risk for bronchogenic carcinoma, follow-up chest CT at 6-12 months is recommended. This recommendation follows the consensus statement: Guidelines for Management of Small Pulmonary Nodules Detected on CT Scans: A Statement from the Fleischner Society as published in Radiology 2005; 237:395-400.   Electronically Signed   By: Ruel Favors M.D.   On: 01/10/2014 13:53   Dg Chest Portable 1 View  01/10/2014   CLINICAL DATA:  Cough, congestion, wet breathing sounds, weakness, LEFT side chest pain earlier, past history of hypertension, type 2 diabetes, stroke, asthma, diastolic CHF  EXAM: PORTABLE CHEST - 1 VIEW  COMPARISON:  Portable exam 1056 hr compared to 12/18/2013  FINDINGS: Normal heart size and pulmonary vascularity.  Calcified tortuous thoracic aorta.  Bronchitic changes with question underlying emphysematous changes.  No acute infiltrate, pleural effusion, or pneumothorax.  Skin folds project over the chest bilaterally.  Bones demineralized.  IMPRESSION: Bronchitic and question emphysematous changes.  No acute abnormalities.   Electronically Signed   By: Ulyses Southward M.D.   On: 01/10/2014 11:17     EKG Interpretation   Date/Time:   Monday January 10 2014 10:24:54 EST Ventricular Rate:  72 PR Interval:  172 QRS Duration: 122 QT Interval:  466 QTC Calculation: 510 R Axis:   -35 Text Interpretation:  Age not entered, assumed to be  80 years old for  purpose of ECG interpretation Sinus rhythm Left bundle branch block No  significant change since last tracing Confirmed by Juleen China  MD, Kitrina Maurin  ([id number]) on 01/10/2014 3:36:54 PM      MDM   Final diagnoses:  Dyspnea  Tachypnea  83yF with dyspnea. She remains very tachypneic. W/u has been fairly unremarkable though. Clinically doesn't appear very volume overloaded. BNP about the same as on admission 3w ago. CXR and subsequent CT w/o overt edema. No evidence of PE. Denies CP. EKG not acutely changed. Denies CP. Trop x1 normal. She did dose of prednisone, lasix, and duonbeb.     Raeford RazorStephen Janneth Krasner, MD 01/12/14 [phone]

## 2014-01-10 NOTE — ED Notes (Signed)
Pt with hx of COPD to ED c/o sob even at sitting position since yesterday.  RR of 40.  Sats of 100% on 3L.

## 2014-01-10 NOTE — ED Notes (Signed)
Admitting MD at bedside.

## 2014-01-11 ENCOUNTER — Telehealth: Payer: Self-pay

## 2014-01-11 DIAGNOSIS — R0600 Dyspnea, unspecified: Secondary | ICD-10-CM | POA: Diagnosis present

## 2014-01-11 DIAGNOSIS — K219 Gastro-esophageal reflux disease without esophagitis: Secondary | ICD-10-CM | POA: Diagnosis present

## 2014-01-11 DIAGNOSIS — Z66 Do not resuscitate: Secondary | ICD-10-CM | POA: Diagnosis present

## 2014-01-11 DIAGNOSIS — M8580 Other specified disorders of bone density and structure, unspecified site: Secondary | ICD-10-CM | POA: Diagnosis present

## 2014-01-11 DIAGNOSIS — R0789 Other chest pain: Secondary | ICD-10-CM

## 2014-01-11 DIAGNOSIS — F329 Major depressive disorder, single episode, unspecified: Secondary | ICD-10-CM | POA: Diagnosis present

## 2014-01-11 DIAGNOSIS — F0391 Unspecified dementia with behavioral disturbance: Secondary | ICD-10-CM | POA: Diagnosis present

## 2014-01-11 DIAGNOSIS — Z8673 Personal history of transient ischemic attack (TIA), and cerebral infarction without residual deficits: Secondary | ICD-10-CM | POA: Diagnosis not present

## 2014-01-11 DIAGNOSIS — Z7982 Long term (current) use of aspirin: Secondary | ICD-10-CM | POA: Diagnosis not present

## 2014-01-11 DIAGNOSIS — Z91013 Allergy to seafood: Secondary | ICD-10-CM | POA: Diagnosis not present

## 2014-01-11 DIAGNOSIS — I447 Left bundle-branch block, unspecified: Secondary | ICD-10-CM | POA: Diagnosis present

## 2014-01-11 DIAGNOSIS — I5032 Chronic diastolic (congestive) heart failure: Secondary | ICD-10-CM | POA: Diagnosis present

## 2014-01-11 DIAGNOSIS — R440 Auditory hallucinations: Secondary | ICD-10-CM | POA: Diagnosis present

## 2014-01-11 DIAGNOSIS — I10 Essential (primary) hypertension: Secondary | ICD-10-CM | POA: Diagnosis present

## 2014-01-11 DIAGNOSIS — E119 Type 2 diabetes mellitus without complications: Secondary | ICD-10-CM | POA: Diagnosis present

## 2014-01-11 DIAGNOSIS — J9621 Acute and chronic respiratory failure with hypoxia: Secondary | ICD-10-CM | POA: Diagnosis present

## 2014-01-11 DIAGNOSIS — Z515 Encounter for palliative care: Secondary | ICD-10-CM | POA: Diagnosis not present

## 2014-01-11 DIAGNOSIS — J45909 Unspecified asthma, uncomplicated: Secondary | ICD-10-CM | POA: Diagnosis present

## 2014-01-11 DIAGNOSIS — Z9981 Dependence on supplemental oxygen: Secondary | ICD-10-CM | POA: Diagnosis not present

## 2014-01-11 DIAGNOSIS — Z794 Long term (current) use of insulin: Secondary | ICD-10-CM | POA: Diagnosis not present

## 2014-01-11 DIAGNOSIS — F419 Anxiety disorder, unspecified: Secondary | ICD-10-CM | POA: Diagnosis present

## 2014-01-11 DIAGNOSIS — Z87891 Personal history of nicotine dependence: Secondary | ICD-10-CM | POA: Diagnosis not present

## 2014-01-11 DIAGNOSIS — H269 Unspecified cataract: Secondary | ICD-10-CM | POA: Diagnosis present

## 2014-01-11 DIAGNOSIS — E785 Hyperlipidemia, unspecified: Secondary | ICD-10-CM | POA: Diagnosis present

## 2014-01-11 DIAGNOSIS — J441 Chronic obstructive pulmonary disease with (acute) exacerbation: Secondary | ICD-10-CM | POA: Diagnosis present

## 2014-01-11 LAB — GLUCOSE, CAPILLARY
GLUCOSE-CAPILLARY: 129 mg/dL — AB (ref 70–99)
GLUCOSE-CAPILLARY: 151 mg/dL — AB (ref 70–99)
Glucose-Capillary: 153 mg/dL — ABNORMAL HIGH (ref 70–99)
Glucose-Capillary: 193 mg/dL — ABNORMAL HIGH (ref 70–99)

## 2014-01-11 LAB — TROPONIN I

## 2014-01-11 MED ORDER — IPRATROPIUM-ALBUTEROL 0.5-2.5 (3) MG/3ML IN SOLN
3.0000 mL | Freq: Three times a day (TID) | RESPIRATORY_TRACT | Status: DC
  Administered 2014-01-12 – 2014-01-13 (×5): 3 mL via RESPIRATORY_TRACT
  Filled 2014-01-11 (×5): qty 3

## 2014-01-11 MED ORDER — PREDNISONE 20 MG PO TABS
40.0000 mg | ORAL_TABLET | Freq: Every day | ORAL | Status: DC
  Administered 2014-01-12 – 2014-01-13 (×2): 40 mg via ORAL
  Filled 2014-01-11 (×3): qty 2

## 2014-01-11 MED ORDER — MORPHINE SULFATE (CONCENTRATE) 10 MG/0.5ML PO SOLN
5.0000 mg | ORAL | Status: DC | PRN
  Administered 2014-01-11: 5 mg via ORAL
  Filled 2014-01-11: qty 0.5

## 2014-01-11 MED ORDER — METHYLPREDNISOLONE SODIUM SUCC 40 MG IJ SOLR
40.0000 mg | Freq: Two times a day (BID) | INTRAMUSCULAR | Status: AC
  Administered 2014-01-11: 40 mg via INTRAVENOUS
  Filled 2014-01-11: qty 1

## 2014-01-11 MED ORDER — SORBITOL 70 % SOLN: 30.0000 mL | Freq: Every day | Status: DC | PRN

## 2014-01-11 NOTE — Progress Notes (Signed)
Received request from CM Deborah for [facility] services after discharge. [facility] April to September 2014; [facility], [facility] will [facility] to confirm hospice eligibility.  Writer contacted pt's daughter Emmie Niemannracy Young ([phone]) who shared that she has noted a decline in her mother's ability to function and her breathing status has become more labored at rest and with minimal exertion and this is her second hospitalization in less than a month for her breathing issues. French Anaracy stated a formal discharge date has not been discussed with her and per her discussion with the PMT NP the plan is to try liquid Morphine to see if this helps her mother's breathing;  Discussed with French Anaracy the need for a face to face visit [facility] and that the Referral Center will try to arrange for this to occur once her mother is at home; French Anaracy stated she is agreeable to [facility] contacting her to arrange for an assessment visit.  [facility] Referral Center has the patient's information and will follow up. Valente DavidMargie Charmagne Buhl, RN MSN [facility] Hospital Liaison [phone]

## 2014-01-11 NOTE — Care Management Note (Signed)
    Page 1 of 2   01/13/2014     12:32:16 PM CARE MANAGEMENT NOTE 01/13/2014  Patient:  Alicia Kent,Alicia Kent   Account Number:  [id number]  Date Initiated:  01/11/2014  Documentation initiated by:  Letha CapeAYLOR,Darra Rosa  Subjective/Objective Assessment:   dx copd, dementia  admit- lives with spouse.     Action/Plan:   Anticipated DC Date:  01/12/2014   Anticipated DC Plan:  HOME W HOME HEALTH SERVICES      DC Planning Services  CM consult      [facility]   Choice offered to / List presented to:  C-4 Adult Children   DME arranged  [facility]      DME agency  [facility]     HH arranged  HH-1 RN      [facility]   Status of service:  Completed, signed off Medicare Important Message given?  YES (If response is "NO", the following Medicare IM given date fields will be blank) Date Medicare IM given:  01/13/2014 Medicare IM given by:  Letha CapeAYLOR,Shayden Gingrich Date Additional Medicare IM given:   Additional Medicare IM given by:    Discharge Disposition:  HOME W HOME HEALTH SERVICES  Per UR Regulation:  Reviewed for med. necessity/level of care/duration of stay  If discussed at Long Length of Stay Meetings, dates discussed:    Comments:  01/13/14 1228 Letha Capeeborah Abbagail Scaff RN, BSN [phone] patient is for dc today, NCM spoke with daughtere and Alicia Kent with [facility], patient will have Kent [id number] with Hospice MD to see if she will be able to be readmitted back with [facility]. NCM informed [facility] that we will need to resume HHRN until we know patient is with Hospice.  Also NCM faxed oxygen orders to [facility] , daughter states patient has home oxygen but needs portables.  Patient will be transported home via ambulance with oxygen.  01/11/14 1617 Letha Capeeborah Georgena Weisheit RN,BSN [phone] NCM offered choice to daughter, Alicia Kent, she chose [facility], [facility], Alicia Kent notified.  Patient already has Kent hospital bed, w/chair, rolling walker, neb machine .  Her oxygen is thru [facility] , she is on  2 liters, Patient will be able to be transported by car per Daughter. THis information [facility], she states if patient has been with them before she may  need Kent face to face with their Hospice MD, but she will be speaking with daughter today.

## 2014-01-11 NOTE — Progress Notes (Signed)
UR completed 

## 2014-01-11 NOTE — Progress Notes (Signed)
PROGRESS NOTE  Alicia Kent ZOX:[id number] DOB: 25-Dec-1930 DOA: 01/10/2014 PCP: Oliver Barre, MD  Assessment/Plan: COPD exacerbation:  -short course of IV Solu-Medrol, oxygen, bronchodilator nebulization's and oral doxycycline. -wean O2 to home 2L  Acute on chronic hypoxic respiratory failure:  -had oxygen saturation of 88% in the ED. -This is secondary to COPD exacerbation and some element of anxiety. Her CHF seems to be compensated. Treatment as per problem #1 and monitor. Improving.  Atypical chest pain: History unreliable. Chest pain currently resolved. May be secondary to problem #1. EKG without acute changes. Troponins 2 negative. Cycle troponins and monitor closely. Continue aspirin.  Chronic diastolic CHF: Clinically seems to be compensated. Of note, patient received a dose of IV Lasix in the ED.Continue home dose Lasix from tomorrow.  Essential hypertension: Controlled. Continue home medications - labetalol and nifedipine XL.  Type II DM: Patient has not taken Lantus since 11/7. Blood glucose within normal range currently. Hold Lantus. CBG is likely to rise from Solu-Medrol. Will treat with SSI for now.  Dementia with behavioral disturbances/Depression: at risk for in-hospital delirium related to hospitalization, acute illness, sundowning, steroids. Continue Aricept, Seroquel, Celexa and monitor closely. Early DC to home would help.  History of GERD: Continue PPI and Pepcid.  Pulmonary nodules: CTA chest showed Sub mm left upper lobe and subpleural nodule - follow-up CT chest in 3-6 months as per recommendations.  Code Status: *DNR Family Communication: no family at bedside Disposition Plan:    Consultants:    Procedures:      HPI/Subjective: Awake, alert  Objective: Filed Vitals:   01/11/14 0616  BP: 150/71  Pulse: 83  Temp: 98.3 F (36.8 C)  Resp: 18   No intake or output data in the 24 hours ending 01/11/14 0935 Filed Weights   01/10/14 1715 01/11/14  0613  Weight: 67.042 kg (147 lb 12.8 oz) 67.1 kg (147 lb 14.9 oz)    Exam:   General:  Awake, appehensive appearing  Cardiovascular: rrr  Respiratory: no wheezing  Abdomen: +BS, soft  Musculoskeletal: no edema  Data Reviewed: Basic Metabolic Panel:  Recent Labs Lab 01/10/14 1052  NA 140  K 4.0  CL 103  CO2 19  GLUCOSE 84  BUN 8  CREATININE 0.76  CALCIUM 8.9   Liver Function Tests: No results for input(s): AST, ALT, ALKPHOS, BILITOT, PROT, ALBUMIN in the last 168 hours. No results for input(s): LIPASE, AMYLASE in the last 168 hours. No results for input(s): AMMONIA in the last 168 hours. CBC:  Recent Labs Lab 01/10/14 1052  WBC 10.0  NEUTROABS 6.8  HGB 12.0  HCT 36.1  MCV 88.3  PLT 336   Cardiac Enzymes:  Recent Labs Lab 01/10/14 1052 01/10/14 1521 01/10/14 2116 01/11/14 0345  TROPONINI <0.30 <0.30 <0.30 <0.30   BNP (last 3 results)  Recent Labs  12/16/13 1943 01/10/14 1049  PROBNP 639.0* 631.1*   CBG:  Recent Labs Lab 01/10/14 1657 01/10/14 1726 01/10/14 2230 01/11/14 0805  GLUCAP 125* 129* 175* 129*    No results found for this or any previous visit (from the past 240 hour(s)).   Studies: Ct Angio Chest Pe W/cm &/or Wo Cm  01/10/2014   CLINICAL DATA:  Acute dyspnea, COPD  EXAM: CT ANGIOGRAPHY CHEST WITH CONTRAST  TECHNIQUE: Multidetector CT imaging of the chest was performed using the standard protocol during bolus administration of intravenous contrast. Multiplanar CT image reconstructions and MIPs were obtained to evaluate the vascular anatomy.  CONTRAST:  OMNIPAQUE IOHEXOL  350 MG/ML SOLN  COMPARISON:  05/24/2012  FINDINGS: Limited exam with respiratory motion artifact. This limits assessment for small peripheral segmental or subsegmental emboli. Within the limits of study, there is no significant central or proximal hilar filling defect or pulmonary embolus. No evidence of CT right heart strain.  Atherosclerosis noted of the  elongated ectatic thoracic aorta. There is mural thickening noted of the descending thoracic aorta as before. No adenopathy. Coronary calcifications noted. No pericardial or pleural effusion. Small hiatal hernia evident.  Lung windows demonstrated hyperinflation with upper lobe predominant mild centrilobular emphysema. No pneumothorax. Small peripheral subpleural left upper lobe 7 mm pulmonary nodule evident, image 24. This was not clearly evident on the comparison study from 05/24/2012. Bibasilar dependent atelectasis evident. No focal pneumonia, collapse or consolidation. No acute airspace process, interstitial edema, or interstitial lung disease. Trachea and central airways are appear patent.  Included upper abdomen demonstrates no acute finding.  Degenerative changes of the thoracic spine diffusely. Chronic appearing T12 compression fracture  Review of the MIP images confirms the above findings.  IMPRESSION: Limited with motion artifact but no significant central or proximal hilar acute pulmonary embolus.  Stable emphysema  Sub mm left upper lobe and subpleural nodule  If the patient is at high risk for bronchogenic carcinoma, follow-up chest CT at 3-246months is recommended. If the patient is at low risk for bronchogenic carcinoma, follow-up chest CT at 6-12 months is recommended. This recommendation follows the consensus statement: Guidelines for Management of Small Pulmonary Nodules Detected on CT Scans: A Statement from the Fleischner Society as published in Radiology 2005; 237:395-400.   Electronically Signed   By: Ruel Favorsrevor  Shick M.D.   On: 01/10/2014 13:53   Dg Chest Portable 1 View  01/10/2014   CLINICAL DATA:  Cough, congestion, wet breathing sounds, weakness, LEFT side chest pain earlier, past history of hypertension, type 2 diabetes, stroke, asthma, diastolic CHF  EXAM: PORTABLE CHEST - 1 VIEW  COMPARISON:  Portable exam 1056 hr compared to 12/18/2013  FINDINGS: Normal heart size and pulmonary  vascularity.  Calcified tortuous thoracic aorta.  Bronchitic changes with question underlying emphysematous changes.  No acute infiltrate, pleural effusion, or pneumothorax.  Skin folds project over the chest bilaterally.  Bones demineralized.  IMPRESSION: Bronchitic and question emphysematous changes.  No acute abnormalities.   Electronically Signed   By: Ulyses SouthwardMark  Boles M.D.   On: 01/10/2014 11:17    Scheduled Meds: . aspirin EC  81 mg Oral Daily  . citalopram  20 mg Oral q morning - 10a  . donepezil  10 mg Oral Daily  . doxycycline  100 mg Oral Q12H  . enoxaparin (LOVENOX) injection  40 mg Subcutaneous Q24H  . famotidine  40 mg Oral Daily  . furosemide  20 mg Oral Daily  . guaiFENesin  600 mg Oral BID  . insulin aspart  0-5 Units Subcutaneous QHS  . insulin aspart  0-9 Units Subcutaneous TID WC  . ipratropium-albuterol  3 mL Nebulization Q6H  . labetalol  200 mg Oral BID  . methylPREDNISolone (SOLU-MEDROL) injection  40 mg Intravenous Q12H  . montelukast  10 mg Oral QHS  . NIFEdipine  30 mg Oral q morning - 10a  . pantoprazole  40 mg Oral Daily  . QUEtiapine  50 mg Oral QHS  . sodium chloride  3 mL Intravenous Q12H   Continuous Infusions:  Antibiotics Given (last 72 hours)    Date/Time Action Medication Dose   01/10/14 2337 Given   doxycycline (  VIBRA-TABS) tablet 100 mg 100 mg      Active Problems:   Dementia with behavioral disturbance   Essential hypertension   Diastolic CHF   COPD exacerbation   Diabetes mellitus type 2, controlled   Dyspnea   Atypical chest pain    Time spent: 35 min    Ngina Royer  [facility] Pager [phone]. If 7PM-7AM, please contact night-coverage at [email or web address], password [email or web address] 01/11/2014, 9:35 AM  LOS: 1 day

## 2014-01-11 NOTE — Consult Note (Signed)
Patient RU:EAVW RAGUEL KOSLOSKI      DOB: [birth year]      UJW:[id number]     Consult Note from the Palliative Medicine Team at [facility] Requested by: Dr. Eliseo Squires    PCP: Cathlean Cower, MD Reason for Consultation: [place], hospice options  Phone Number:[phone]  Assessment of patients Current state: I met today with Alicia Kent and spoke with her daughter, Luther Bradley, via telephone. Alicia Kent tells me that she has watched her mother decline especially over the past month. Alicia Kent appetite has decreased gradually as she used to love pizza and would never turn this down but lately does not even want pizza when offered. She is incontinent of bowel and urine at most times. Alicia Kent helps her get from her chair to the bathroom with a walker that she has trouble using many times. She has worsening auditory and visual hallucinations and became combative at times - this used to happen more at night but now occurs in daytime too. Alicia Kent said that her mother's shortness of breath is worsening occuring at rest at times. Alicia Kent had hospice care ~1.5 years ago for 6 months but was discharged - she has declined greatly the past couple months. Alicia Kent wants her mother to be comfortable and wishes to have hospice help her keep her mother in her home where she provides 24/7 care to here. I will follow up tomorrow.   Goals of Care: 1.  Code Status: DNR   2. Scope of Treatment: Continue gentle treatment and comfort measures.    4. Disposition: Hopeful for home with hospice.    3. Symptom Management:   1. Shortness of breath: Roxanol 5 mg every 2 hours prn.  2. Sleep: Seroquel 50 mg qhs.  3. Cough: Robitussin DM every 4 hours prn.  4. Bowel Regimen: Sorbitol daily prn.  5. Fever: Acetaminophen prn.  6. Nausea/Vomiting: Ondansetron prn.   4. Psychosocial: Emotional support provided to patient and daughter via telephone.    Brief HPI: 80 yo female admitted with dyspnea r/t COPD exacerbation. PMH significant for  dementia with behavioral disturbance, HTN, LBBB, AAA, carotid stenosis, diastolic CHF, depression, asthma, allergic rhinitis, GERD, diabetes mellitus type II, CVA, osteopenia.    ROS: + shortness of breath    PMH:  Past Medical History  Diagnosis Date  . Diabetes mellitus type II   . Hyperlipidemia   . HTN (hypertension)   . GERD (gastroesophageal reflux disease)   . Allergic rhinitis   . Depression   . Dementia     mild  . History of CVA (cerebrovascular accident)   . LBBB (left bundle branch block)   . AAA (abdominal aortic aneurysm)     small, last check in 05/2009, due for repeat in 05/2010  . Carotid stenosis 09/2009    56-21% RICA, 3-08% LICA  . LVH (left ventricular hypertrophy) 10/2009    Moderate, EF 55-60%, no regional wall motion abnormalities  . Shellfish allergy     anaphylaxis  . Osteopenia   . Vitamin D deficiency   . Fracture of rib of left side 12/2009  . Asthma 01/27/2011  . Diastolic CHF 6/57/8469  . AAA (abdominal aortic aneurysm) without rupture 07/16/2013  . Cataract      PSH: Past Surgical History  Procedure Laterality Date  . Ankle surgery      Right, by Ortho in [place]  . Tracheostomy  2010    anaphylactic episode on vent  . Tubal ligation    .  Joint replacement Left     knee   I have reviewed the [place] and [facility] and  If appropriate update it with new information. Allergies  Allergen Reactions  . Ibuprofen Anaphylaxis  . Shellfish-Derived Products Anaphylaxis    Swelling tongue and throat  . Ace Inhibitors Other (See Comments)    unknown  . Sulfa Antibiotics Nausea And Vomiting   Scheduled Meds: . aspirin EC  81 mg Oral Daily  . citalopram  20 mg Oral q morning - 10a  . donepezil  10 mg Oral Daily  . doxycycline  100 mg Oral Q12H  . enoxaparin (LOVENOX) injection  40 mg Subcutaneous Q24H  . famotidine  40 mg Oral Daily  . furosemide  20 mg Oral Daily  . guaiFENesin  600 mg Oral BID  . insulin aspart  0-5 Units Subcutaneous QHS  .  insulin aspart  0-9 Units Subcutaneous TID WC  . ipratropium-albuterol  3 mL Nebulization Q6H  . labetalol  200 mg Oral BID  . methylPREDNISolone (SOLU-MEDROL) injection  40 mg Intravenous Q12H  . montelukast  10 mg Oral QHS  . NIFEdipine  30 mg Oral q morning - 10a  . pantoprazole  40 mg Oral Daily  . [START ON 01/12/2014] predniSONE  40 mg Oral Q breakfast  . QUEtiapine  50 mg Oral QHS  . sodium chloride  3 mL Intravenous Q12H   Continuous Infusions:  PRN Meds:.acetaminophen **OR** acetaminophen, albuterol, fluticasone, guaiFENesin-dextromethorphan, ondansetron **OR** ondansetron (ZOFRAN) IV    BP 103/52 mmHg  Pulse 75  Temp(Src) 97.8 F (36.6 C) (Oral)  Resp 24  Ht _0  (1.676 m)  Wt 67.1 kg (147 lb 14.9 oz)  BMI 23.89 kg/m2  SpO2 98%   PPS: 20%   Intake/Output Summary (Last 24 hours) at 01/11/14 1555 Last data filed at 01/11/14 0800  Gross per 24 hour  Intake    120 ml  Output      0 ml  Net    120 ml   LBM: 01/08/14  Physical Exam:  General: NAD, pale, pleasant HEENT: [place]/AT, no JVD, moist mucous membranes Chest: CTA throughout, no labored breathing, symmetric CVS: RRR, S1 S2 Abdomen: Soft, NT, ND, +BS Ext: MAE, no edema Neuro: Awake, alert, oriented to person only  Labs: CBC    Component Value Date/Time   WBC 10.0 01/10/2014 1052   RBC 4.09 01/10/2014 1052   RBC 3.77* 05/20/2011 0350   HGB 12.0 01/10/2014 1052   HCT 36.1 01/10/2014 1052   PLT 336 01/10/2014 1052   MCV 88.3 01/10/2014 1052   MCH 29.3 01/10/2014 1052   MCHC 33.2 01/10/2014 1052   RDW 13.7 01/10/2014 1052   LYMPHSABS 2.1 01/10/2014 1052   MONOABS 0.8 01/10/2014 1052   EOSABS 0.3 01/10/2014 1052   BASOSABS 0.0 01/10/2014 1052    BMET    Component Value Date/Time   NA 140 01/10/2014 1052   K 4.0 01/10/2014 1052   CL 103 01/10/2014 1052   CO2 19 01/10/2014 1052   GLUCOSE 84 01/10/2014 1052   BUN 8 01/10/2014 1052   CREATININE 0.76 01/10/2014 1052   CALCIUM 8.9 01/10/2014  1052   CALCIUM 8.5 12/29/2012 0525   GFRNONAA 77* 01/10/2014 1052   GFRAA 89* 01/10/2014 1052    CMP     Component Value Date/Time   NA 140 01/10/2014 1052   K 4.0 01/10/2014 1052   CL 103 01/10/2014 1052   CO2 19 01/10/2014 1052   GLUCOSE 84 01/10/2014  1052   BUN 8 01/10/2014 1052   CREATININE 0.76 01/10/2014 1052   CALCIUM 8.9 01/10/2014 1052   CALCIUM 8.5 12/29/2012 0525   PROT 7.7 12/18/2013 1224   ALBUMIN 3.4* 12/18/2013 1224   AST 19 12/18/2013 1224   ALT 7 12/18/2013 1224   ALKPHOS 82 12/18/2013 1224   BILITOT 0.4 12/18/2013 1224   GFRNONAA 77* 01/10/2014 1052   GFRAA 89* 01/10/2014 1052     Time In Time Out Total Time Spent with Patient Total Overall Time  1440 1600 28mn 838m    Greater than 50%  of this time was spent counseling and coordinating care related to the above assessment and plan.  AlVinie SillNP Palliative Medicine Team Pager # [phone]-F 8a-5p) Team Phone # [phone]/Weekends)

## 2014-01-11 NOTE — Telephone Encounter (Signed)
PCP completed [facility] paperwork for patient.  Patients daughter French Anaracy has been informed and can pickup at the front desk.

## 2014-01-12 ENCOUNTER — Telehealth: Payer: Self-pay | Admitting: *Deleted

## 2014-01-12 DIAGNOSIS — R0600 Dyspnea, unspecified: Secondary | ICD-10-CM

## 2014-01-12 DIAGNOSIS — J441 Chronic obstructive pulmonary disease with (acute) exacerbation: Secondary | ICD-10-CM

## 2014-01-12 DIAGNOSIS — F0391 Unspecified dementia with behavioral disturbance: Secondary | ICD-10-CM

## 2014-01-12 DIAGNOSIS — J9601 Acute respiratory failure with hypoxia: Secondary | ICD-10-CM

## 2014-01-12 LAB — CBC
HCT: 38.1 % (ref 36.0–46.0)
HEMOGLOBIN: 13 g/dL (ref 12.0–15.0)
MCH: 29.5 pg (ref 26.0–34.0)
MCHC: 34.1 g/dL (ref 30.0–36.0)
MCV: 86.4 fL (ref 78.0–100.0)
PLATELETS: 399 10*3/uL (ref 150–400)
RBC: 4.41 MIL/uL (ref 3.87–5.11)
RDW: 13.7 % (ref 11.5–15.5)
WBC: 9.8 10*3/uL (ref 4.0–10.5)

## 2014-01-12 LAB — GLUCOSE, CAPILLARY
GLUCOSE-CAPILLARY: 228 mg/dL — AB (ref 70–99)
GLUCOSE-CAPILLARY: 183 mg/dL — AB (ref 70–99)
Glucose-Capillary: 137 mg/dL — ABNORMAL HIGH (ref 70–99)
Glucose-Capillary: 130 mg/dL — ABNORMAL HIGH (ref 70–99)

## 2014-01-12 LAB — BASIC METABOLIC PANEL
ANION GAP: 19 — AB (ref 5–15)
BUN: 27 mg/dL — ABNORMAL HIGH (ref 6–23)
CO2: 21 mEq/L (ref 19–32)
Calcium: 9.1 mg/dL (ref 8.4–10.5)
Chloride: 98 mEq/L (ref 96–112)
Creatinine, Ser: 1.01 mg/dL (ref 0.50–1.10)
GFR, EST AFRICAN AMERICAN: 59 mL/min — AB (ref >90)
GFR, EST NON AFRICAN AMERICAN: 51 mL/min — AB (ref >90)
GLUCOSE: 172 mg/dL — AB (ref 70–99)
POTASSIUM: 3.8 meq/L (ref 3.7–5.3)
SODIUM: 138 meq/L (ref 137–147)

## 2014-01-12 NOTE — Plan of Care (Signed)
Problem: Phase I Progression Outcomes Goal: Tolerating diet Outcome: Progressing     

## 2014-01-12 NOTE — Plan of Care (Signed)
Problem: Phase I Progression Outcomes Goal: O2 sats > or equal 90% or at baseline Outcome: Progressing     

## 2014-01-12 NOTE — Progress Notes (Signed)
PROGRESS NOTE  Alicia BoucheMary A Kent ZOX:[id number] DOB: Feb 24, 1931 DOA: 01/10/2014 PCP: Alicia BarreJames John, MD  Assessment/Plan: COPD exacerbation:  -prednisone, oxygen, nebs, and oral doxycycline. -wean O2 to home 2L -trial of PO morphine  Acute on chronic hypoxic respiratory failure:  -had oxygen saturation of 88% in the ED. -This is secondary to COPD exacerbation and some element of anxiety. Her CHF seems to be compensated. Treatment as per problem #1 and monitor. Improving.  Atypical chest pain: History unreliable. Chest pain currently resolved. May be secondary to problem #1. EKG without acute changes. Troponins 2 negative. Cycle troponins and monitor closely. Continue aspirin.  Chronic diastolic CHF: Clinically seems to be compensated. Of note, patient received a dose of IV Lasix in the ED.Continue home dose Lasix   Essential hypertension: Controlled. Continue home medications - labetalol and nifedipine XL.  Type II DM: Patient has not taken Lantus since 11/7. Blood glucose within normal range currently. Hold Lantus. CBG is likely to rise from Solu-Medrol. Will treat with SSI for now.  Dementia with behavioral disturbances/Depression: at risk for in-hospital delirium related to hospitalization, acute illness, sundowning, steroids. Continue Aricept, Seroquel, Celexa and monitor closely. Early DC to home would help.  History of GERD: Continue PPI and Pepcid.  Pulmonary nodules: CTA chest showed Sub mm left upper lobe and subpleural nodule - follow-up CT chest in 3-6 months as per recommendations.  Palliative care eval and possible hospice at home   Code Status: DNR Family Communication: spoke with daughter via telephone 11/10 Disposition Plan: d/c once home hospice arranged   Consultants:    Procedures:      HPI/Subjective: Resting, no SOB  Objective: Filed Vitals:   01/12/14 0527  BP: 128/56  Pulse: 70  Temp: 98 F (36.7 C)  Resp: 20    Intake/Output Summary (Last 24  hours) at 01/12/14 1048 Last data filed at 01/12/14 0453  Gross per 24 hour  Intake    100 ml  Output    150 ml  Net    -50 ml   Filed Weights   01/10/14 1715 01/11/14 0613 01/12/14 0527  Weight: 67.042 kg (147 lb 12.8 oz) 67.1 kg (147 lb 14.9 oz) 68 kg (149 lb 14.6 oz)    Exam:   General:  Resting, easily awoken  Cardiovascular: rrr  Respiratory: no wheezing  Abdomen: +BS, soft  Musculoskeletal: no edema  Data Reviewed: Basic Metabolic Panel:  Recent Labs Lab 01/10/14 1052 01/12/14 0058  NA 140 138  K 4.0 3.8  CL 103 98  CO2 19 21  GLUCOSE 84 172*  BUN 8 27*  CREATININE 0.76 1.01  CALCIUM 8.9 9.1   Liver Function Tests: No results for input(s): AST, ALT, ALKPHOS, BILITOT, PROT, ALBUMIN in the last 168 hours. No results for input(s): LIPASE, AMYLASE in the last 168 hours. No results for input(s): AMMONIA in the last 168 hours. CBC:  Recent Labs Lab 01/10/14 1052 01/12/14 0058  WBC 10.0 9.8  NEUTROABS 6.8  --   HGB 12.0 13.0  HCT 36.1 38.1  MCV 88.3 86.4  PLT 336 399   Cardiac Enzymes:  Recent Labs Lab 01/10/14 1052 01/10/14 1521 01/10/14 2116 01/11/14 0345  TROPONINI <0.30 <0.30 <0.30 <0.30   BNP (last 3 results)  Recent Labs  12/16/13 1943 01/10/14 1049  PROBNP 639.0* 631.1*   CBG:  Recent Labs Lab 01/11/14 0805 01/11/14 1221 01/11/14 1655 01/11/14 2130 01/12/14 0758  GLUCAP 129* 151* 153* 193* 137*    No results found for  this or any previous visit (from the past 240 hour(s)).   Studies: Ct Angio Chest Pe W/cm &/or Wo Cm  01/10/2014   CLINICAL DATA:  Acute dyspnea, COPD  EXAM: CT ANGIOGRAPHY CHEST WITH CONTRAST  TECHNIQUE: Multidetector CT imaging of the chest was performed using the standard protocol during bolus administration of intravenous contrast. Multiplanar CT image reconstructions and MIPs were obtained to evaluate the vascular anatomy.  CONTRAST:  OMNIPAQUE IOHEXOL 350 MG/ML SOLN  COMPARISON:  05/24/2012   FINDINGS: Limited exam with respiratory motion artifact. This limits assessment for small peripheral segmental or subsegmental emboli. Within the limits of study, there is no significant central or proximal hilar filling defect or pulmonary embolus. No evidence of CT right heart strain.  Atherosclerosis noted of the elongated ectatic thoracic aorta. There is mural thickening noted of the descending thoracic aorta as before. No adenopathy. Coronary calcifications noted. No pericardial or pleural effusion. Small hiatal hernia evident.  Lung windows demonstrated hyperinflation with upper lobe predominant mild centrilobular emphysema. No pneumothorax. Small peripheral subpleural left upper lobe 7 mm pulmonary nodule evident, image 24. This was not clearly evident on the comparison study from 05/24/2012. Bibasilar dependent atelectasis evident. No focal pneumonia, collapse or consolidation. No acute airspace process, interstitial edema, or interstitial lung disease. Trachea and central airways are appear patent.  Included upper abdomen demonstrates no acute finding.  Degenerative changes of the thoracic spine diffusely. Chronic appearing T12 compression fracture  Review of the MIP images confirms the above findings.  IMPRESSION: Limited with motion artifact but no significant central or proximal hilar acute pulmonary embolus.  Stable emphysema  Sub mm left upper lobe and subpleural nodule  If the patient is at high risk for bronchogenic carcinoma, follow-up chest CT at 3-57months is recommended. If the patient is at low risk for bronchogenic carcinoma, follow-up chest CT at 6-12 months is recommended. This recommendation follows the consensus statement: Guidelines for Management of Small Pulmonary Nodules Detected on CT Scans: A Statement from the Fleischner Society as published in Radiology 2005; 237:395-400.   Electronically Signed   By: Alicia Kent M.D.   On: 01/10/2014 13:53   Dg Chest Portable 1 View  01/10/2014    CLINICAL DATA:  Cough, congestion, wet breathing sounds, weakness, LEFT side chest pain earlier, past history of hypertension, type 2 diabetes, stroke, asthma, diastolic CHF  EXAM: PORTABLE CHEST - 1 VIEW  COMPARISON:  Portable exam 1056 hr compared to 12/18/2013  FINDINGS: Normal heart size and pulmonary vascularity.  Calcified tortuous thoracic aorta.  Bronchitic changes with question underlying emphysematous changes.  No acute infiltrate, pleural effusion, or pneumothorax.  Skin folds project over the chest bilaterally.  Bones demineralized.  IMPRESSION: Bronchitic and question emphysematous changes.  No acute abnormalities.   Electronically Signed   By: Ulyses Southward M.D.   On: 01/10/2014 11:17    Scheduled Meds: . aspirin EC  81 mg Oral Daily  . citalopram  20 mg Oral q morning - 10a  . donepezil  10 mg Oral Daily  . doxycycline  100 mg Oral Q12H  . enoxaparin (LOVENOX) injection  40 mg Subcutaneous Q24H  . famotidine  40 mg Oral Daily  . furosemide  20 mg Oral Daily  . guaiFENesin  600 mg Oral BID  . insulin aspart  0-5 Units Subcutaneous QHS  . insulin aspart  0-9 Units Subcutaneous TID WC  . ipratropium-albuterol  3 mL Nebulization TID  . labetalol  200 mg Oral BID  .  montelukast  10 mg Oral QHS  . NIFEdipine  30 mg Oral q morning - 10a  . pantoprazole  40 mg Oral Daily  . predniSONE  40 mg Oral Q breakfast  . QUEtiapine  50 mg Oral QHS  . sodium chloride  3 mL Intravenous Q12H   Continuous Infusions:  Antibiotics Given (last 72 hours)    Date/Time Action Medication Dose   01/10/14 2337 Given   doxycycline (VIBRA-TABS) tablet 100 mg 100 mg   01/11/14 1009 Given   doxycycline (VIBRA-TABS) tablet 100 mg 100 mg   01/11/14 2258 Given   doxycycline (VIBRA-TABS) tablet 100 mg 100 mg   01/12/14 1004 Given   doxycycline (VIBRA-TABS) tablet 100 mg 100 mg      Active Problems:   Dementia with behavioral disturbance   Essential hypertension   Diastolic CHF   COPD exacerbation    Diabetes mellitus type 2, controlled   Dyspnea   Atypical chest pain    Time spent: 25 min    Mackensi Mahadeo  [facility] Pager [phone]. If 7PM-7AM, please contact night-coverage at [email or web address], password [email or web address] 01/12/2014, 10:48 AM  LOS: 2 days

## 2014-01-12 NOTE — Telephone Encounter (Signed)
Pt is being d/c from hospital today with hospice services. Wanting to know will md be the attending physicians, and will it be ok if hospice md help with symptoms management...Raechel Chute/lmb

## 2014-01-12 NOTE — Telephone Encounter (Signed)
Notified Kathy with md response.../lmb 

## 2014-01-12 NOTE — Plan of Care (Signed)
Problem: Phase I Progression Outcomes Goal: Pain controlled Outcome: Progressing     

## 2014-01-12 NOTE — Telephone Encounter (Signed)
Yes to both, thanks

## 2014-01-12 NOTE — Progress Notes (Signed)
Nutrition Brief Note  Chart reviewed. Pt now transitioning to comfort care; pt expected to be discharged today with home hospice.  No further nutrition interventions warranted at this time.  Please re-consult as needed.   Trivia Heffelfinger A. Mayford KnifeWilliams, RD, LDN Pager: [phone] After hours Pager: [phone]

## 2014-01-12 NOTE — Progress Notes (Signed)
Progress Note [facility] Palliative Medicine Team [facility]  Subjective:  Ms. Alicia Kent is awake, alert and very pleasant and cooperative at this time. She is having a good day and answering my questions and happy to talk. She is smiling and was originally very short of breath as NT had just finished bathing her. She has no complaints. Roxanol was given once last night and it seemed to be helpful for here. I spoke with her daughter, French Anaracy, over the phone who tells me that her mother should be coming home tomorrow. French Anaracy understands her mother's poor prognosis and even was explaining to another family member the roxanol to help her short of breath and French Anaracy had to tell them that this will not "make her better" that this is to help her feel better but that there is nothing to reverse the COPD or dementia.    Objective: Allergies  Allergen Reactions  . Ibuprofen Anaphylaxis  . Shellfish-Derived Products Anaphylaxis    Swelling tongue and throat  . Ace Inhibitors Other (See Comments)    unknown  . Sulfa Antibiotics Nausea And Vomiting   Scheduled Meds: . aspirin EC  81 mg Oral Daily  . citalopram  20 mg Oral q morning - 10a  . donepezil  10 mg Oral Daily  . doxycycline  100 mg Oral Q12H  . enoxaparin (LOVENOX) injection  40 mg Subcutaneous Q24H  . famotidine  40 mg Oral Daily  . furosemide  20 mg Oral Daily  . guaiFENesin  600 mg Oral BID  . insulin aspart  0-5 Units Subcutaneous QHS  . insulin aspart  0-9 Units Subcutaneous TID WC  . ipratropium-albuterol  3 mL Nebulization TID  . labetalol  200 mg Oral BID  . montelukast  10 mg Oral QHS  . NIFEdipine  30 mg Oral q morning - 10a  . pantoprazole  40 mg Oral Daily  . predniSONE  40 mg Oral Q breakfast  . QUEtiapine  50 mg Oral QHS  . sodium chloride  3 mL Intravenous Q12H   Continuous Infusions:  PRN Meds:.acetaminophen **OR** acetaminophen, albuterol, fluticasone, guaiFENesin-dextromethorphan, morphine CONCENTRATE, ondansetron **OR**  ondansetron (ZOFRAN) IV, sorbitol  BP 132/53 mmHg  Pulse 74  Temp(Src) 97.7 F (36.5 C) (Oral)  Resp 22  Ht 5\' 6"  (1.676 m)  Wt 68 kg (149 lb 14.6 oz)  BMI 24.21 kg/m2  SpO2 100%   PPS: 20%   Intake/Output Summary (Last 24 hours) at 01/12/14 2012 Last data filed at 01/12/14 1330  Gross per 24 hour  Intake    540 ml  Output    150 ml  Net    390 ml      LBM: 11/10  Physical Exam:  General: NAD, pleasant HEENT: [place]/AT, no JVD, moist mucous membranes Chest: CTA throughout, no labored breathing, symmetric CVS: RRR, S1 S2 Abdomen: Soft, NT, ND, +BS Ext: MAE, no edema, warm to touch Neuro: Awake, alert, oriented to person only  Labs: CBC    Component Value Date/Time   WBC 9.8 01/12/2014 0058   RBC 4.41 01/12/2014 0058   RBC 3.77* 05/20/2011 0350   HGB 13.0 01/12/2014 0058   HCT 38.1 01/12/2014 0058   PLT 399 01/12/2014 0058   MCV 86.4 01/12/2014 0058   MCH 29.5 01/12/2014 0058   MCHC 34.1 01/12/2014 0058   RDW 13.7 01/12/2014 0058   LYMPHSABS 2.1 01/10/2014 1052   MONOABS 0.8 01/10/2014 1052   EOSABS 0.3 01/10/2014 1052   BASOSABS 0.0 01/10/2014  1052    BMET    Component Value Date/Time   NA 138 01/12/2014 0058   K 3.8 01/12/2014 0058   CL 98 01/12/2014 0058   CO2 21 01/12/2014 0058   GLUCOSE 172* 01/12/2014 0058   BUN 27* 01/12/2014 0058   CREATININE 1.01 01/12/2014 0058   CALCIUM 9.1 01/12/2014 0058   CALCIUM 8.5 12/29/2012 0525   GFRNONAA 51* 01/12/2014 0058   GFRAA 59* 01/12/2014 0058    CMP     Component Value Date/Time   NA 138 01/12/2014 0058   K 3.8 01/12/2014 0058   CL 98 01/12/2014 0058   CO2 21 01/12/2014 0058   GLUCOSE 172* 01/12/2014 0058   BUN 27* 01/12/2014 0058   CREATININE 1.01 01/12/2014 0058   CALCIUM 9.1 01/12/2014 0058   CALCIUM 8.5 12/29/2012 0525   PROT 7.7 12/18/2013 1224   ALBUMIN 3.4* 12/18/2013 1224   AST 19 12/18/2013 1224   ALT 7 12/18/2013 1224   ALKPHOS 82 12/18/2013 1224   BILITOT 0.4 12/18/2013 1224    GFRNONAA 51* 01/12/2014 0058   GFRAA 59* 01/12/2014 0058      Assessment and Plan: 1. Code Status: DNR 2. Symptom Control: 1. Shortness of breath: Roxanol 5 mg every 2 hours prn.  2. Sleep: Seroquel 50 mg qhs.  3. Cough: Robitussin DM every 4 hours prn.  4. Bowel Regimen: Sorbitol daily prn.  5. Fever: Acetaminophen prn.  6. Nausea/Vomiting: Ondansetron prn.  3. Psycho/Social: Emotional support provided to patient and family.  4. Disposition: Home with hospice.     Time In Time Out Total Time Spent with Patient Total Overall Time  1530 1550 15min 20min    Greater than 50%  of this time was spent counseling and coordinating care related to the above assessment and plan.   Yong ChannelAlicia Charley Lafrance, NP Palliative Medicine Team Pager # [phone] (M-F 8a-5p) Team Phone # [phone] (Nights/Weekends)

## 2014-01-13 DIAGNOSIS — J9601 Acute respiratory failure with hypoxia: Secondary | ICD-10-CM

## 2014-01-13 LAB — GLUCOSE, CAPILLARY
GLUCOSE-CAPILLARY: 133 mg/dL — AB (ref 70–99)
Glucose-Capillary: 164 mg/dL — ABNORMAL HIGH (ref 70–99)

## 2014-01-13 MED ORDER — PREDNISONE 10 MG PO TABS: ORAL_TABLET | ORAL | Status: AC

## 2014-01-13 MED ORDER — GUAIFENESIN-DM 100-10 MG/5ML PO SYRP: 5.0000 mL | ORAL_SOLUTION | ORAL | Status: AC | PRN

## 2014-01-13 MED ORDER — SORBITOL 70 % SOLN: 30.0000 mL | Freq: Every day | Status: AC | PRN

## 2014-01-13 MED ORDER — DOXYCYCLINE HYCLATE 100 MG PO TABS: 100.0000 mg | ORAL_TABLET | Freq: Two times a day (BID) | ORAL | Status: AC

## 2014-01-13 MED ORDER — MORPHINE SULFATE (CONCENTRATE) 10 MG/0.5ML PO SOLN: 5.0000 mg | ORAL | Status: AC | PRN

## 2014-01-13 NOTE — Progress Notes (Addendum)
SATURATION QUALIFICATIONS: (This note is used to comply with regulatory documentation for home oxygen)  Patient Saturations on Room Air at Rest = 88%  Patient Saturations on Room Air while Ambulating = n/a%  Patient Saturations on 2 Liters of oxygen at rest = 97%  Please briefly explain why patient needs home oxygen: Patient wearing oxygen at 2L/min - sat O2 at rest=97%. Patient is non ambulating. On room air, sat O2=88%. Patient needs oxygen at home.

## 2014-01-13 NOTE — Progress Notes (Signed)
Patient was discharged home with home health by MD order; discharged instructions  review and give to patient and her daughter with care notes; IV DIC; skin intact; patient will be transported to her house via EMS with oxygen 2L/min via [place].

## 2014-01-13 NOTE — Plan of Care (Signed)
Problem: ICU Phase Progression Outcomes Goal: Flu/PneumoVaccines if indicated Outcome: Not Applicable Date Met:  01/13/14  Problem: Phase I Progression Outcomes Goal: Flu/PneumoVaccines if indicated Outcome: Not Applicable Date Met:  01/13/14  Problem: Discharge Progression Outcomes Goal: Home O2 if indicated Outcome: Completed/Met Date Met:  01/13/14 Goal: Flu vaccine received if indicated Outcome: Not Applicable Date Met:  01/13/14 Goal: Pneumonia vaccine received if indicated Outcome: Not Applicable Date Met:  01/13/14     

## 2014-01-13 NOTE — Discharge Summary (Signed)
Physician Discharge Summary  Alicia Kent ZOX:[id number] DOB: 04-28-1930 DOA: 01/10/2014  PCP: Alicia Kent John, MD  Admit date: 01/10/2014 Discharge date: 01/13/2014  Time spent: 35 minutes  Recommendations for Outpatient Follow-up:  1. Home with hospice (will come to come to eval) if not a candidate, will then resume home health follow-up CT chest in 3-6 months as per recommendations- if does not go to hospice  Discharge Diagnoses:  Active Problems:   Dementia with behavioral disturbance   Essential hypertension   Diastolic CHF   COPD exacerbation   Diabetes mellitus type 2, controlled   Dyspnea   Atypical chest pain   Discharge Condition: stable  Diet recommendation: as tolerated  Filed Weights   01/11/14 0613 01/12/14 0527 01/13/14 0500  Weight: 67.1 kg (147 lb 14.9 oz) 68 kg (149 lb 14.6 oz) 66.6 kg (146 lb 13.2 oz)    History of present illness:  Alicia Kent is a 80 y.o. female with history of COPD, essential hypertension, chronic diastolic CHF, hyperlipidemia, former heavy smoker, GERD, LBBB, dementia with behavioral disturbances, depression, chronic respiratory failure on home oxygen 2 L/m continuously, DM 2, recently hospitalized 10/15-10/19 for dyspnea which was attributed to combination of COPD exacerbation and decompensated CHF. History is obtained from patient and patient's daughter Ms. Alicia Kent via phone. Patient apparently was in her usual state of health until sometime last night when she started having worsening dyspnea. She usually has some degree of dyspnea on exertion. This was associated with cough with creamy sputum. She denied fever or chills. She denied wheezing. She apparently complained of some intermittent chest pain in the ED early on but denies it currently. As per daughter, patient's mental status has been progressively declining from her dementia and she has ongoing visual and auditory hallucinations where she speaks to people who are not around or are  demised. She sees children who are not around. She gets paranoid at times - feels like her son-in-law is out to kill her. She gets occasionally agitated. She does have sundowning. In the ED, Lab work was unremarkable including troponin  2 negative and proBNP 631. CTA chest was negative for PE but showed emphysema and pulmonary nodules. Patient has received nebulizations and a dose of IV Lasix. She states that her dyspnea is much better. Hospitalist admission has been requested.   Hospital Course:  COPD exacerbation:  -prednisone, oxygen, nebs, and oral doxycycline. -wean O2 to home 2L -trial of PO morphine- appears to have calmed her breathing  Acute on chronic hypoxic respiratory failure:  -had oxygen saturation of 88% in the ED. -This is secondary to COPD exacerbation and some element of anxiety. Her CHF seems to be compensated. Treatment as per problem #1 and monitor. Improving.  Atypical chest pain: CE negative  Chronic diastolic CHF: Clinically seems to be compensated. Of note, patient received a dose of IV Lasix in the ED.Continue home dose Lasix   Essential hypertension: Controlled. Continue home medications - labetalol and nifedipine XL.  Type II DM: monitor  Dementia with behavioral disturbances/Depression:  Continue Aricept, Seroquel, Celexa and monitor closely. Early DC to home   History of GERD: Continue PPI and Pepcid.  Pulmonary nodules: CTA chest showed Sub mm left upper lobe and subpleural nodule - follow-up CT chest in 3-6 months as per recommendations.  Procedures:    Consultations:  palliative care/hospice  Discharge Exam: Filed Vitals:   01/13/14 1418  BP: 157/124  Pulse: 62  Temp: 97.5 F (36.4 C)  Resp:  22    General: pleasant Cardiovascular: rrr Respiratory: moving good air  Discharge Instructions You were cared for by a hospitalist during your hospital stay. If you have any questions about your discharge medications or the care you received  while you were in the hospital after you are discharged, you can call the unit and asked to speak with the hospitalist on call if the hospitalist that took care of you is not available. Once you are discharged, your primary care physician will handle any further medical issues. Please note that NO REFILLS for any discharge medications will be authorized once you are discharged, as it is imperative that you return to your primary care physician (or establish a relationship with a primary care physician if you do not have one) for your aftercare needs so that they can reassess your need for medications and monitor your lab values.  Discharge Instructions    Diet - low sodium heart healthy    Complete by:  As directed      Diet Carb Modified    Complete by:  As directed      Discharge instructions    Complete by:  As directed   Home with hospice Home O2- 2L     Increase activity slowly    Complete by:  As directed           Discharge Medication List as of 01/13/2014 10:46 AM    START taking these medications   Details  doxycycline (VIBRA-TABS) 100 MG tablet Take 1 tablet (100 mg total) by mouth every 12 (twelve) hours., Starting 01/13/2014, Until Discontinued, Print    guaiFENesin-dextromethorphan (ROBITUSSIN DM) 100-10 MG/5ML syrup Take 5 mLs by mouth every 4 (four) hours as needed for cough., Starting 01/13/2014, Until Discontinued, Print    Morphine Sulfate (MORPHINE CONCENTRATE) 10 MG/0.5ML SOLN concentrated solution Take 0.25 mLs (5 mg total) by mouth every 2 (two) hours as needed for shortness of breath., Starting 01/13/2014, Until Discontinued, Print    predniSONE (DELTASONE) 10 MG tablet 30 mg x 2 days, 20 mg x 2 days, 10 mg x 2 days then d/c, Print    sorbitol 70 % SOLN Take 30 mLs by mouth daily as needed for mild constipation., Starting 01/13/2014, Until Discontinued, Print      CONTINUE these medications which have NOT CHANGED   Details  albuterol (PROVENTIL HFA;VENTOLIN  HFA) 108 (90 BASE) MCG/ACT inhaler Inhale 2 puffs into the lungs every 6 (six) hours as needed for wheezing or shortness of breath., Starting 01/04/2014, Until Discontinued, Normal    aspirin 81 MG EC tablet Take 81 mg by mouth every morning. , Until Discontinued, Historical Med    citalopram (CELEXA) 20 MG tablet Take 1 tablet (20 mg total) by mouth every morning., Starting 12/14/2013, Until Discontinued, Normal    donepezil (ARICEPT) 10 MG tablet Take 1 tablet (10 mg total) by mouth daily., Starting 12/14/2013, Until Discontinued, Normal    famotidine (PEPCID) 40 MG tablet Take 40 mg by mouth daily., Until Discontinued, Historical Med    fluticasone (FLONASE) 50 MCG/ACT nasal spray Place 2 sprays into both nostrils daily., Starting 12/20/2013, Until Discontinued, Normal    furosemide (LASIX) 20 MG tablet Take 1 tablet (20 mg total) by mouth daily., Starting 12/20/2013, Until Discontinued, Normal    ipratropium-albuterol (DUONEB) 0.5-2.5 (3) MG/3ML SOLN Take 3 mLs by nebulization every 6 (six) hours as needed (shortness of breath)., Until Discontinued, Historical Med    labetalol (NORMODYNE) 200 MG tablet Take 1 tablet (  200 mg total) by mouth 2 (two) times daily., Starting 12/14/2013, Until Discontinued, Normal    montelukast (SINGULAIR) 10 MG tablet Take 1 tablet (10 mg total) by mouth at bedtime., Starting 12/14/2013, Until Discontinued, Normal    NIFEdipine (PROCARDIA-XL/ADALAT-CC/NIFEDICAL-XL) 30 MG 24 hr tablet Take 1 tablet (30 mg total) by mouth every morning., Starting 03/25/2013, Until Discontinued, Normal    omeprazole (PRILOSEC) 20 MG capsule Take 1 capsule (20 mg total) by mouth daily., Starting 04/09/2013, Until Discontinued, Normal    QUEtiapine (SEROQUEL) 50 MG tablet Take 1 tablet (50 mg total) by mouth at bedtime., Starting 01/04/2014, Until Discontinued, Normal    insulin glargine (LANTUS) 100 UNIT/ML injection Inject 0.25 mLs (25 Units total) into the skin at bedtime.,  Starting 03/25/2013, Until Discontinued, Normal    levalbuterol (XOPENEX HFA) 45 MCG/ACT inhaler Inhale 2 puffs into the lungs every 6 (six) hours as needed for wheezing., Starting 12/20/2013, Until Discontinued, Normal    loperamide (IMODIUM A-D) 2 MG tablet Take 2 mg by mouth as needed for diarrhea or loose stools., Until Discontinued, Historical Med      STOP taking these medications     Insulin Pen Needle (B-D UF III MINI PEN NEEDLES) 31G X 5 MM MISC        Allergies  Allergen Reactions  . Ibuprofen Anaphylaxis  . Shellfish-Derived Products Anaphylaxis    Swelling tongue and throat  . Ace Inhibitors Other (See Comments)    unknown  . Sulfa Antibiotics Nausea And Vomiting   Follow-up Information    Follow up with [facility].   Specialty:  Home Health Services   Why:  resume [facility]   Contact information:   [address] [place] [address] [phone]        The results of significant diagnostics from this hospitalization (including imaging, microbiology, ancillary and laboratory) are listed below for reference.    Significant Diagnostic Studies: Ct Angio Chest Pe W/cm &/or Wo Cm  01/10/2014   CLINICAL DATA:  Acute dyspnea, COPD  EXAM: CT ANGIOGRAPHY CHEST WITH CONTRAST  TECHNIQUE: Multidetector CT imaging of the chest was performed using the standard protocol during bolus administration of intravenous contrast. Multiplanar CT image reconstructions and MIPs were obtained to evaluate the vascular anatomy.  CONTRAST:  OMNIPAQUE IOHEXOL 350 MG/ML SOLN  COMPARISON:  05/24/2012  FINDINGS: Limited exam with respiratory motion artifact. This limits assessment for small peripheral segmental or subsegmental emboli. Within the limits of study, there is no significant central or proximal hilar filling defect or pulmonary embolus. No evidence of CT right heart strain.  Atherosclerosis noted of the elongated ectatic thoracic aorta. There is mural  thickening noted of the descending thoracic aorta as before. No adenopathy. Coronary calcifications noted. No pericardial or pleural effusion. Small hiatal hernia evident.  Lung windows demonstrated hyperinflation with upper lobe predominant mild centrilobular emphysema. No pneumothorax. Small peripheral subpleural left upper lobe 7 mm pulmonary nodule evident, image 24. This was not clearly evident on the comparison study from 05/24/2012. Bibasilar dependent atelectasis evident. No focal pneumonia, collapse or consolidation. No acute airspace process, interstitial edema, or interstitial lung disease. Trachea and central airways are appear patent.  Included upper abdomen demonstrates no acute finding.  Degenerative changes of the thoracic spine diffusely. Chronic appearing T12 compression fracture  Review of the MIP images confirms the above findings.  IMPRESSION: Limited with motion artifact but no significant central or proximal hilar acute pulmonary embolus.  Stable emphysema  Sub  mm left upper lobe and subpleural nodule  If the patient is at high risk for bronchogenic carcinoma, follow-up chest CT at 3-35months is recommended. If the patient is at low risk for bronchogenic carcinoma, follow-up chest CT at 6-12 months is recommended. This recommendation follows the consensus statement: Guidelines for Management of Small Pulmonary Nodules Detected on CT Scans: A Statement from the Fleischner Society as published in Radiology 2005; 237:395-400.   Electronically Signed   By: Ruel Favors M.D.   On: 01/10/2014 13:53   Nm Pulmonary Perf And Vent  12/18/2013   CLINICAL DATA:  COPD and hypertension.  Elevated D-dimer.  EXAM: NUCLEAR MEDICINE VENTILATION - PERFUSION LUNG SCAN  TECHNIQUE: Ventilation images were obtained in multiple projections using inhaled aerosol technetium 99 M DTPA. Perfusion images were obtained in multiple projections after intravenous injection of Tc-11m MAA.  RADIOPHARMACEUTICALS:  40 mCi  Tc-85m DTPA aerosol and 6 mCi Tc-20m MAA  COMPARISON:  Single view of the chest 12/18/2013.  FINDINGS: Ventilation: No focal ventilation defect.  Perfusion: No wedge shaped peripheral perfusion defects to suggest acute pulmonary embolism.  IMPRESSION: Negative for pulmonary embolus.   Electronically Signed   By: Drusilla Kanner M.D.   On: 12/18/2013 12:34   Dg Chest Portable 1 View  01/10/2014   CLINICAL DATA:  Cough, congestion, wet breathing sounds, weakness, LEFT side chest pain earlier, past history of hypertension, type 2 diabetes, stroke, asthma, diastolic CHF  EXAM: PORTABLE CHEST - 1 VIEW  COMPARISON:  Portable exam 1056 hr compared to 12/18/2013  FINDINGS: Normal heart size and pulmonary vascularity.  Calcified tortuous thoracic aorta.  Bronchitic changes with question underlying emphysematous changes.  No acute infiltrate, pleural effusion, or pneumothorax.  Skin folds project over the chest bilaterally.  Bones demineralized.  IMPRESSION: Bronchitic and question emphysematous changes.  No acute abnormalities.   Electronically Signed   By: Ulyses Southward M.D.   On: 01/10/2014 11:17   Dg Chest Port 1 View  12/18/2013   CLINICAL DATA:  Shortness of breath.  EXAM: PORTABLE CHEST - 1 VIEW  COMPARISON:  Single view of the chest 12/16/2013 and 12/27/2012.  FINDINGS: The lungs are clear. Heart size is normal. There is no pneumothorax or pleural effusion.  IMPRESSION: No acute disease.   Electronically Signed   By: Drusilla Kanner M.D.   On: 12/18/2013 11:38   Dg Chest Portable 1 View  12/16/2013   CLINICAL DATA:  Bradycardia, shortness of breath. Abdominal pain. History of asthma.  EXAM: PORTABLE CHEST - 1 VIEW  COMPARISON:  Chest radiograph December 27, 2012  FINDINGS: Cardiac silhouette appears unremarkable. Mildly calcified aortic knob. Mild chronic interstitial changes and increased lung volumes. No pleural effusions focal consolidations. No pneumothorax. Skin fold RIGHT midlung zone.  Soft tissue  planes and included osseous structures are nonsuspicious; mild scoliosis.  IMPRESSION: Suspected COPD without superimposed acute cardiopulmonary process.   Electronically Signed   By: Awilda Metro   On: 12/16/2013 20:50    Microbiology: No results found for this or any previous visit (from the past 240 hour(s)).   Labs: Basic Metabolic Panel:  Recent Labs Lab 01/10/14 1052 01/12/14 0058  NA 140 138  K 4.0 3.8  CL 103 98  CO2 19 21  GLUCOSE 84 172*  BUN 8 27*  CREATININE 0.76 1.01  CALCIUM 8.9 9.1   Liver Function Tests: No results for input(s): AST, ALT, ALKPHOS, BILITOT, PROT, ALBUMIN in the last 168 hours. No results for input(s): LIPASE, AMYLASE in  the last 168 hours. No results for input(s): AMMONIA in the last 168 hours. CBC:  Recent Labs Lab 01/10/14 1052 01/12/14 0058  WBC 10.0 9.8  NEUTROABS 6.8  --   HGB 12.0 13.0  HCT 36.1 38.1  MCV 88.3 86.4  PLT 336 399   Cardiac Enzymes:  Recent Labs Lab 01/10/14 1052 01/10/14 1521 01/10/14 2116 01/11/14 0345  TROPONINI <0.30 <0.30 <0.30 <0.30   BNP: BNP (last 3 results)  Recent Labs  12/16/13 1943 01/10/14 1049  PROBNP 639.0* 631.1*   CBG:  Recent Labs Lab 01/12/14 1236 01/12/14 1732 01/12/14 2231 01/13/14 0800 01/13/14 1208  GLUCAP 130* 228* 183* 133* 164*       Signed:  Adaleigh Warf  DO [facility] 01/13/2014, 6:10 PM

## 2014-01-13 NOTE — Plan of Care (Signed)
Problem: Phase I Progression Outcomes Goal: Dyspnea controlled at rest Outcome: Progressing     

## 2014-01-13 NOTE — Clinical Social Work Note (Addendum)
Per MD patient ready to DC back to home. RN, patient/family, and facility notified of patient's DC. DC packet on patient's chart (patient hard prescriptions included in packet). Ambulance transport requested for patient for 1:00pm to allow time for RN to get patient's O2 saturations (service request ID: [id number]). Addressed confirmed by daughter. CSW signing off at this time.   Roddie McBryant Persephone Schriever MSW, [place], [place], [phone]

## 2014-01-13 NOTE — Plan of Care (Signed)
Problem: ICU Phase Progression Outcomes Goal: O2 sats trending toward baseline Outcome: Completed/Met Date Met:  01/13/14 Goal: Dyspnea controlled at rest Outcome: Completed/Met Date Met:  01/13/14 Goal: Hemodynamically stable Outcome: Completed/Met Date Met:  01/13/14 Goal: Pain controlled with appropriate interventions Outcome: Completed/Met Date Met:  01/13/14 Goal: Initial discharge plan identified Outcome: Completed/Met Date Met:  01/13/14 Goal: Voiding-avoid urinary catheter unless indicated Outcome: Completed/Met Date Met:  01/13/14 Goal: Other ICU Phase Outcomes/Goals Outcome: Not Applicable Date Met:  14/38/88  Problem: Phase I Progression Outcomes Goal: O2 sats > or equal 90% or at baseline Outcome: Completed/Met Date Met:  01/13/14 Goal: Dyspnea controlled at rest Outcome: Completed/Met Date Met:  01/13/14 Goal: Hemodynamically stable Outcome: Completed/Met Date Met:  01/13/14 Goal: Pain controlled Outcome: Completed/Met Date Met:  01/13/14 Goal: Progress activity as tolerated unless otherwise ordered Outcome: Completed/Met Date Met:  01/13/14 Goal: Discharge plan established Outcome: Completed/Met Date Met:  01/13/14 Goal: Tolerating diet Outcome: Completed/Met Date Met:  01/13/14 Goal: Other Phase II Outcomes/Goals Outcome: Not Applicable Date Met:  75/79/72  Problem: Phase II Progression Outcomes Goal: O2 sats > equal to 90% on RA or at baseline Outcome: Completed/Met Date Met:  01/13/14 Goal: Pain controlled on oral analgesia Outcome: Completed/Met Date Met:  01/13/14 Goal: Dyspnea controlled w/progressive activity Outcome: Completed/Met Date Met:  01/13/14 Goal: ADLs completed with minimal assistance Outcome: Not Applicable Date Met:  82/06/01 Goal: Activity at appropriate level-compared to baseline (UP IN CHAIR FOR HEMODIALYSIS)  Outcome: Completed/Met Date Met:  01/13/14 Goal: Tolerating diet Outcome: Completed/Met Date Met:  01/13/14 Goal: Discharge  plan remains appropriate-arrangements made Outcome: Completed/Met Date Met:  01/13/14 Goal: Other Phase II Outcomes/Goals Outcome: Not Applicable Date Met:  56/15/37  Problem: Discharge Progression Outcomes Goal: Dyspnea controlled Outcome: Completed/Met Date Met:  01/13/14 Goal: O2 sats > or equal 90% or at baseline Outcome: Completed/Met Date Met:  01/13/14 Goal: Able to self administer respiratory meds Outcome: Not Applicable Date Met:  94/32/76 Goal: Independent ADLs or Home Health Care Outcome: Completed/Met Date Met:  01/13/14 Goal: Barriers To Progression Addressed/Resolved Outcome: Not Applicable Date Met:  14/70/92 Goal: Discharge plan in place and appropriate Outcome: Completed/Met Date Met:  01/13/14 Goal: Pain controlled with appropriate interventions Outcome: Completed/Met Date Met:  01/13/14 Goal: Tolerating diet Outcome: Completed/Met Date Met:  01/13/14 Goal: Activity appropriate for discharge plan Outcome: Completed/Met Date Met:  01/13/14 Goal: Hemodynamically stable Outcome: Completed/Met Date Met:  95/74/73 Goal: Complications resolved/controlled Outcome: Not Applicable Date Met:  40/37/09 Goal: Other Discharge Outcomes/Goals Outcome: Not Applicable Date Met:  64/38/38

## 2014-01-13 NOTE — Plan of Care (Signed)
Problem: Consults Goal: Respiratory Problems Patient Education See Patient Education Module for education specifics.  Outcome: Completed/Met Date Met:  01/13/14 Goal: Skin Care Protocol Initiated - if Braden Score 18 or less If consults are not indicated, leave blank or document N/A  Outcome: Not Applicable Date Met:  70/35/00 Goal: Nutrition Consult-if indicated Outcome: Completed/Met Date Met:  01/13/14 Goal: Diabetes Guidelines if Diabetic/Glucose > 140 If diabetic or lab glucose is > 140 mg/dl - Initiate Diabetes/Hyperglycemia Guidelines & Document Interventions  Outcome: Not Applicable Date Met:  93/81/82

## 2014-01-13 NOTE — Progress Notes (Signed)
Case discussed in long length of stay meeting with MD Director, Dr Jacky KindleAronson, who expressed concern about patient readmitting to hospital. Came to bedside to speak with patient's daughter [facility]. Patient was active in the past but was discharged due to having hospice services. Unfortunately, the daughter had just left room prior to bedside visit. Called [facility] phone several times to discuss [facility] follow up to ensure patient has resources in place post discharge. Was unable to leave voicemail message. However, Emmie Niemannracy Young (daughter) returned call. [facility] Anaracy states she has no intentions on placing patient in any facility at this time. Patient's daughter states she is able to care for her mother at home. States she is awaiting for hospice to evaluate patient at home to resume hospice services. In the interim, verbal consent was obtained [facility] post discharge. Discussed the above with inpatient RNCM and inpatient LCSW.  Raiford NobleAtika Jasmyne Lodato, MSN- RN,BSN- [facility] Care [facility] Hospital [phone]

## 2014-01-13 NOTE — Progress Notes (Signed)
Follow[facility]up - [facility]; referral received to re-initiate services; [facility] physician and assessment nurse will plan to see pt at her home once discharged- this was discussed with pt's daughter Emmie Niemannracy Young ([phone]) who informs that she is planning to come this morning to the hospital and understood plan was for discharge this morning; she plans to take her mother home by personal vehicle; [facility] is [facility]  [facility] contacting her to arrange for visit to the home either this afternoon or tomorrow. She has Referral Center number [phone]) for any questions and a pamphlet with [facility] contact information left at bedside. Please call with any questions. [facility] Referral Center will follow up Valente DavidMargie Michelle Vanhise RN MSN [facility] Hospital Liaison [phone]

## 2014-01-14 ENCOUNTER — Ambulatory Visit: Payer: Commercial Managed Care - HMO | Admitting: Internal Medicine

## 2014-02-01 DIAGNOSIS — E119 Type 2 diabetes mellitus without complications: Secondary | ICD-10-CM | POA: Diagnosis not present

## 2014-02-01 DIAGNOSIS — J441 Chronic obstructive pulmonary disease with (acute) exacerbation: Secondary | ICD-10-CM | POA: Diagnosis not present

## 2014-02-01 DIAGNOSIS — R260 Ataxic gait: Secondary | ICD-10-CM | POA: Diagnosis not present

## 2014-02-01 DIAGNOSIS — I5033 Acute on chronic diastolic (congestive) heart failure: Secondary | ICD-10-CM | POA: Diagnosis not present

## 2014-02-03 DIAGNOSIS — E119 Type 2 diabetes mellitus without complications: Secondary | ICD-10-CM | POA: Diagnosis not present

## 2014-02-03 DIAGNOSIS — R260 Ataxic gait: Secondary | ICD-10-CM | POA: Diagnosis not present

## 2014-02-03 DIAGNOSIS — I5033 Acute on chronic diastolic (congestive) heart failure: Secondary | ICD-10-CM | POA: Diagnosis not present

## 2014-02-03 DIAGNOSIS — J441 Chronic obstructive pulmonary disease with (acute) exacerbation: Secondary | ICD-10-CM | POA: Diagnosis not present

## 2014-03-05 ENCOUNTER — Encounter (HOSPITAL_COMMUNITY): Payer: Self-pay | Admitting: Emergency Medicine

## 2014-03-05 ENCOUNTER — Emergency Department (HOSPITAL_COMMUNITY)
Admission: EM | Admit: 2014-03-05 | Discharge: 2014-03-05 | Disposition: A | Discharge status: Home / Self Care | Payer: Commercial Managed Care - HMO | Attending: Emergency Medicine | Admitting: Emergency Medicine

## 2014-03-05 ENCOUNTER — Emergency Department (HOSPITAL_COMMUNITY): Payer: Commercial Managed Care - HMO

## 2014-03-05 DIAGNOSIS — Z8673 Personal history of transient ischemic attack (TIA), and cerebral infarction without residual deficits: Secondary | ICD-10-CM | POA: Insufficient documentation

## 2014-03-05 DIAGNOSIS — Z8781 Personal history of (healed) traumatic fracture: Secondary | ICD-10-CM | POA: Diagnosis not present

## 2014-03-05 DIAGNOSIS — Z87891 Personal history of nicotine dependence: Secondary | ICD-10-CM | POA: Insufficient documentation

## 2014-03-05 DIAGNOSIS — Z79899 Other long term (current) drug therapy: Secondary | ICD-10-CM | POA: Diagnosis not present

## 2014-03-05 DIAGNOSIS — Z794 Long term (current) use of insulin: Secondary | ICD-10-CM | POA: Diagnosis not present

## 2014-03-05 DIAGNOSIS — S79911A Unspecified injury of right hip, initial encounter: Secondary | ICD-10-CM | POA: Insufficient documentation

## 2014-03-05 DIAGNOSIS — Y92129 Unspecified place in nursing home as the place of occurrence of the external cause: Secondary | ICD-10-CM | POA: Insufficient documentation

## 2014-03-05 DIAGNOSIS — R109 Unspecified abdominal pain: Secondary | ICD-10-CM | POA: Insufficient documentation

## 2014-03-05 DIAGNOSIS — J45909 Unspecified asthma, uncomplicated: Secondary | ICD-10-CM | POA: Insufficient documentation

## 2014-03-05 DIAGNOSIS — K219 Gastro-esophageal reflux disease without esophagitis: Secondary | ICD-10-CM | POA: Insufficient documentation

## 2014-03-05 DIAGNOSIS — F329 Major depressive disorder, single episode, unspecified: Secondary | ICD-10-CM | POA: Diagnosis not present

## 2014-03-05 DIAGNOSIS — Y998 Other external cause status: Secondary | ICD-10-CM | POA: Insufficient documentation

## 2014-03-05 DIAGNOSIS — S29001A Unspecified injury of muscle and tendon of front wall of thorax, initial encounter: Secondary | ICD-10-CM | POA: Insufficient documentation

## 2014-03-05 DIAGNOSIS — Y9389 Activity, other specified: Secondary | ICD-10-CM | POA: Insufficient documentation

## 2014-03-05 DIAGNOSIS — R0781 Pleurodynia: Secondary | ICD-10-CM

## 2014-03-05 DIAGNOSIS — Z79891 Long term (current) use of opiate analgesic: Secondary | ICD-10-CM | POA: Insufficient documentation

## 2014-03-05 DIAGNOSIS — Z7982 Long term (current) use of aspirin: Secondary | ICD-10-CM | POA: Diagnosis not present

## 2014-03-05 DIAGNOSIS — S99922A Unspecified injury of left foot, initial encounter: Secondary | ICD-10-CM | POA: Insufficient documentation

## 2014-03-05 DIAGNOSIS — W19XXXA Unspecified fall, initial encounter: Secondary | ICD-10-CM

## 2014-03-05 DIAGNOSIS — Z8739 Personal history of other diseases of the musculoskeletal system and connective tissue: Secondary | ICD-10-CM | POA: Diagnosis not present

## 2014-03-05 DIAGNOSIS — Z792 Long term (current) use of antibiotics: Secondary | ICD-10-CM | POA: Diagnosis not present

## 2014-03-05 DIAGNOSIS — W06XXXA Fall from bed, initial encounter: Secondary | ICD-10-CM | POA: Diagnosis not present

## 2014-03-05 DIAGNOSIS — M79672 Pain in left foot: Secondary | ICD-10-CM

## 2014-03-05 DIAGNOSIS — F0390 Unspecified dementia without behavioral disturbance: Secondary | ICD-10-CM | POA: Diagnosis not present

## 2014-03-05 DIAGNOSIS — Z8669 Personal history of other diseases of the nervous system and sense organs: Secondary | ICD-10-CM | POA: Insufficient documentation

## 2014-03-05 DIAGNOSIS — J449 Chronic obstructive pulmonary disease, unspecified: Secondary | ICD-10-CM | POA: Insufficient documentation

## 2014-03-05 DIAGNOSIS — I5030 Unspecified diastolic (congestive) heart failure: Secondary | ICD-10-CM | POA: Insufficient documentation

## 2014-03-05 DIAGNOSIS — E119 Type 2 diabetes mellitus without complications: Secondary | ICD-10-CM | POA: Diagnosis not present

## 2014-03-05 DIAGNOSIS — Z7951 Long term (current) use of inhaled steroids: Secondary | ICD-10-CM | POA: Diagnosis not present

## 2014-03-05 DIAGNOSIS — I10 Essential (primary) hypertension: Secondary | ICD-10-CM | POA: Insufficient documentation

## 2014-03-05 NOTE — ED Provider Notes (Signed)
CSN: [id number]     Arrival date & time 03/05/14  1934 History   First MD Initiated Contact with Patient 03/05/14 1954     Chief Complaint  Patient presents with  . Fall  . Flank Pain     (Consider location/radiation/quality/duration/timing/severity/associated sxs/prior Treatment) Patient is a 81 y.o. female presenting with fall and flank pain. The history is provided by a caregiver.  Fall This is a new problem. The current episode started yesterday. The problem occurs every several days. The problem has been resolved. Pertinent negatives include no chest pain, no abdominal pain, no headaches and no shortness of breath. Nothing aggravates the symptoms. Nothing relieves the symptoms. Treatments tried: morphine. The treatment provided moderate relief.  Flank Pain Pertinent negatives include no chest pain, no abdominal pain, no headaches and no shortness of breath.    Past Medical History  Diagnosis Date  . Diabetes mellitus type II   . Hyperlipidemia   . HTN (hypertension)   . GERD (gastroesophageal reflux disease)   . Allergic rhinitis   . Depression   . Dementia     mild  . History of CVA (cerebrovascular accident)   . LBBB (left bundle branch block)   . AAA (abdominal aortic aneurysm)     small, last check in 05/2009, due for repeat in 05/2010  . Carotid stenosis 09/2009    40-59% RICA, 0-39% LICA  . LVH (left ventricular hypertrophy) 10/2009    Moderate, EF 55-60%, no regional wall motion abnormalities  . Shellfish allergy     anaphylaxis  . Osteopenia   . Vitamin D deficiency   . Fracture of rib of left side 12/2009  . Asthma 01/27/2011  . Diastolic CHF 08/02/2011  . AAA (abdominal aortic aneurysm) without rupture 07/16/2013  . Cataract    Past Surgical History  Procedure Laterality Date  . Ankle surgery      Right, by Ortho in [place]  . Tracheostomy  2010    anaphylactic episode on vent  . Tubal ligation    . Joint replacement Left     knee   Family History   Problem Relation Age of Onset  . Rheum arthritis Sister   . Stroke Brother   . Hypertension Brother   . Diabetes type II Other     2 siblings   History  Substance Use Topics  . Smoking status: Former Smoker    Quit date: 12/17/2010  . Smokeless tobacco: Not on file     Comment: 3/4 ppd  . Alcohol Use: No   OB History    No data available     Review of Systems  Constitutional: Negative for fever and fatigue.  HENT: Negative for congestion and drooling.   Eyes: Negative for pain.  Respiratory: Negative for cough and shortness of breath.   Cardiovascular: Negative for chest pain.  Gastrointestinal: Negative for nausea, vomiting, abdominal pain and diarrhea.  Genitourinary: Positive for flank pain. Negative for dysuria and hematuria.  Musculoskeletal: Negative for back pain, gait problem and neck pain.  Skin: Negative for color change.  Neurological: Negative for dizziness and headaches.  Hematological: Negative for adenopathy.  Psychiatric/Behavioral: Negative for behavioral problems.  All other systems reviewed and are negative.     Allergies  Ibuprofen; Shellfish-derived products; Ace inhibitors; and Sulfa antibiotics  Home Medications   Prior to Admission medications   Medication Sig Start Date End Date Taking? Authorizing Provider  albuterol (PROVENTIL HFA;VENTOLIN HFA) 108 (90 BASE) MCG/ACT inhaler Inhale 2 puffs into  the lungs every 6 (six) hours as needed for wheezing or shortness of breath. 01/04/14   Corwin Levins, MD  aspirin 81 MG EC tablet Take 81 mg by mouth every morning.     Historical Provider, MD  citalopram (CELEXA) 20 MG tablet Take 1 tablet (20 mg total) by mouth every morning. 12/14/13   Corwin Levins, MD  donepezil (ARICEPT) 10 MG tablet Take 1 tablet (10 mg total) by mouth daily. 12/14/13   Corwin Levins, MD  doxycycline (VIBRA-TABS) 100 MG tablet Take 1 tablet (100 mg total) by mouth every 12 (twelve) hours. 01/13/14   Joseph Art, DO  famotidine  (PEPCID) 40 MG tablet Take 40 mg by mouth daily.    Historical Provider, MD  fluticasone (FLONASE) 50 MCG/ACT nasal spray Place 2 sprays into both nostrils daily. Patient taking differently: Place 2 sprays into both nostrils daily as needed for allergies.  12/20/13   Richarda Overlie, MD  furosemide (LASIX) 20 MG tablet Take 1 tablet (20 mg total) by mouth daily. 12/20/13   Richarda Overlie, MD  guaiFENesin-dextromethorphan (ROBITUSSIN DM) 100-10 MG/5ML syrup Take 5 mLs by mouth every 4 (four) hours as needed for cough. 01/13/14   Joseph Art, DO  insulin glargine (LANTUS) 100 UNIT/ML injection Inject 0.25 mLs (25 Units total) into the skin at bedtime. 03/25/13   Corwin Levins, MD  ipratropium-albuterol (DUONEB) 0.5-2.5 (3) MG/3ML SOLN Take 3 mLs by nebulization every 6 (six) hours as needed (shortness of breath).    Historical Provider, MD  labetalol (NORMODYNE) 200 MG tablet Take 1 tablet (200 mg total) by mouth 2 (two) times daily. 12/14/13   Corwin Levins, MD  levalbuterol [facility] HFA) 45 MCG/ACT inhaler Inhale 2 puffs into the lungs every 6 (six) hours as needed for wheezing. 12/20/13   Richarda Overlie, MD  loperamide (IMODIUM A-D) 2 MG tablet Take 2 mg by mouth as needed for diarrhea or loose stools.    Historical Provider, MD  montelukast (SINGULAIR) 10 MG tablet Take 1 tablet (10 mg total) by mouth at bedtime. 12/14/13   Corwin Levins, MD  Morphine Sulfate (MORPHINE CONCENTRATE) 10 MG/0.5ML SOLN concentrated solution Take 0.25 mLs (5 mg total) by mouth every 2 (two) hours as needed for shortness of breath. 01/13/14   Joseph Art, DO  NIFEdipine (PROCARDIA-XL/ADALAT-CC/NIFEDICAL-XL) 30 MG 24 hr tablet Take 1 tablet (30 mg total) by mouth every morning. 03/25/13   Corwin Levins, MD  omeprazole (PRILOSEC) 20 MG capsule Take 1 capsule (20 mg total) by mouth daily. 04/09/13   Corwin Levins, MD  predniSONE (DELTASONE) 10 MG tablet 30 mg x 2 days, 20 mg x 2 days, 10 mg x 2 days then d/c 01/13/14   Joseph Art,  DO  QUEtiapine (SEROQUEL) 50 MG tablet Take 1 tablet (50 mg total) by mouth at bedtime. 01/04/14   Corwin Levins, MD  sorbitol 70 % SOLN Take 30 mLs by mouth daily as needed for mild constipation. 01/13/14   Jessica U Vann, DO   BP 124/60 mmHg  Pulse 75  Temp(Src) 97.8 F (36.6 C) (Oral)  Resp 18  SpO2 99% Physical Exam  Constitutional: She appears well-developed and well-nourished.  HENT:  Head: Normocephalic.  Mouth/Throat: Oropharynx is clear and moist. No oropharyngeal exudate.  Eyes: Conjunctivae and EOM are normal. Pupils are equal, round, and reactive to light.  Neck: Normal range of motion. Neck supple.  No vertebral tenderness.  Cardiovascular: Normal rate,  regular rhythm, normal heart sounds and intact distal pulses.  Exam reveals no gallop and no friction rub.   No murmur heard. Pulmonary/Chest: Effort normal and breath sounds normal. No respiratory distress. She has no wheezes. She exhibits tenderness (tenderness to palpation of the right lateral ribs.).  Abdominal: Soft. Bowel sounds are normal. There is no tenderness. There is no rebound and no guarding.  Musculoskeletal: Normal range of motion. She exhibits tenderness. She exhibits no edema.  Mild tenderness of the right lateral hip with palpation.  Mild to moderate tenderness of the head of the first metatarsal of the left foot.  Otherwise normal strength and sensation in all extremity's.  Neurological: She is alert.  A/o x1.   Skin: Skin is warm and dry.  Psychiatric: She has a normal mood and affect. Her behavior is normal.  Nursing note and vitals reviewed.   ED Course  Procedures (including critical care time) Labs Review Labs Reviewed - No data to display  Imaging Review Dg Ribs Unilateral W/chest Right  03/05/2014   CLINICAL DATA:  Fall today. Shortness of breath with pain. Pain in the right lateral ribs. Area of pain is localized to the BB marker.  EXAM: RIGHT RIBS AND CHEST - 3+ VIEW  COMPARISON:  Chest  01/10/2014  FINDINGS: Normal heart size and pulmonary vascularity. Probable emphysematous changes in the lungs with scattered interstitial changes suggesting chronic bronchitis. No focal airspace disease or consolidation. No blunting of costophrenic angles. No pneumothorax. Calcified and tortuous aorta.  Right ribs appear intact. No displaced fractures or focal bone lesions identified.  IMPRESSION: No evidence of active pulmonary disease. Chronic emphysematous and bronchitic changes in the lungs. No displaced left rib fractures identified.   Electronically Signed   By: Burman Nieves M.D.   On: 03/05/2014 22:02   Dg Hip Complete Right  03/05/2014   CLINICAL DATA:  Patient fell today and complains of right hip pain.  EXAM: RIGHT HIP - COMPLETE 2+ VIEW  COMPARISON:  12/27/2012  FINDINGS: Old fracture deformities demonstrated in the right superior and inferior pubic ramus and the left inferior pubic ramus. Degenerative changes in the lower lumbar spine and both hips. No acute fracture or dislocation is identified in the pelvis or right hip. Vascular calcifications are present. Pelvic calcifications may represent calcified lymph nodes or large phleboliths.  IMPRESSION: Old fracture deformities in the pelvis. No acute fractures are demonstrated.   Electronically Signed   By: Burman Nieves M.D.   On: 03/05/2014 22:04   Dg Foot Complete Left  03/05/2014   CLINICAL DATA:  Patient fell today.  Left foot anterior swelling.  EXAM: LEFT FOOT - COMPLETE 3+ VIEW  COMPARISON:  None.  FINDINGS: Diffuse bone demineralization. No evidence of acute fracture or subluxation. No focal bone lesion or bone destruction. Bone cortex and trabecular architecture appear intact. No radiopaque soft tissue foreign bodies. Plantar calcaneal spur. Calcification of the distal Achilles may represent calcific tendinitis or avulsion injury.  IMPRESSION: No evidence of acute fracture or dislocation. Calcification at the distal Achilles region  may represent calcific tendinitis or old avulsion injury.   Electronically Signed   By: Burman Nieves M.D.   On: 03/05/2014 22:06     EKG Interpretation   Date/Time:  Saturday March 05 2014 19:44:32 EST Ventricular Rate:  75 PR Interval:  162 QRS Duration: 122 QT Interval:  457 QTC Calculation: 510 R Axis:   -38 Text Interpretation:  Sinus rhythm Left bundle branch block No significant  change since last tracing Confirmed by Zian Delair  MD, Alexie Samson [phone]) on  03/05/2014 7:50:28 PM      MDM   Final diagnoses:  Fall  Rib pain on right side  Left foot pain    8:22 PM 81 y.o. female w hx of DM, LBBB, AAA, CHF, end stage copd on 2L [place] at baseline w/ hospice who presents with a mechanical fall which occurred yesterday evening. The family member states that the patient had a unwitnessed fall on transitioning from her bed to her bedside commode. She hit the right lateral ribs on the side of a chair when she fell down onto her bottom. She did not appear to hit her head. She was sitting up when she was found shortly after the fall. The patient currently complains of right lateral rib pain. The caregiver did note that the patient had some discomfort at the right hip and left calcaneus this morning. Patient is afebrile and vital signs are unremarkable here. We'll get screening imaging. She got morphine prior to arrival.  10:51 PM: I interpreted/reviewed the labs and/or imaging which were non-contributory.  I attempted to ambulate the patient but she was hesitant to place weight on the left foot. She continues to complain of pain in the ball of her foot. I reexamined her hips and she has normal range of motion without focal tenderness. She insists that her pain is in the left foot. I reviewed the imaging which is noncontributory. I discussed options with the family and the caregiver would like to take her home. They do have access to pain medications at home. I do not think there is an occult hip  fracture. I have discussed the diagnosis/risks/treatment options with the patient and family and believe the pt to be eligible for discharge home to follow-up with her pcp next week. We also discussed returning to the ED immediately if new or worsening sx occur. We discussed the sx which are most concerning (e.g., worsening pain, inability to bare weight) that necessitate immediate return. Medications administered to the patient during their visit and any new prescriptions provided to the patient are listed below. He is alert like you as he is  Medications given during this visit Medications - No data to display  New Prescriptions   No medications on file       Purvis Sheffield, MD 03/05/14 2259

## 2014-03-05 NOTE — ED Notes (Signed)
Per [facility], pt from home. Fell while on the toilet, lost her balance and fell on her right side. Pt denies hitting head or LOC. Pupils are normally unequal, at the moment pupils are pinpoint due to pt taking hydrocodone. Pt is a hospice patient. Pt c/o right flank pain, family member states pt also c/o right hip pain. Pt hx of COPD, CHF, dementia. Pt also breathes very fast which is normal, per family, is given morphine to help "slow down her breathing". Lung sounds clear by EMS. No obvious bruising or deformities. Pt appears in NAD.

## 2014-03-18 ENCOUNTER — Encounter (HOSPITAL_COMMUNITY): Payer: Self-pay | Admitting: Emergency Medicine

## 2014-03-18 ENCOUNTER — Emergency Department (HOSPITAL_COMMUNITY): Payer: Commercial Managed Care - HMO

## 2014-03-18 ENCOUNTER — Emergency Department (HOSPITAL_COMMUNITY)
Admission: EM | Admit: 2014-03-18 | Discharge: 2014-03-18 | Disposition: A | Discharge status: Home / Self Care | Payer: Commercial Managed Care - HMO | Attending: Emergency Medicine | Admitting: Emergency Medicine

## 2014-03-18 DIAGNOSIS — Z79891 Long term (current) use of opiate analgesic: Secondary | ICD-10-CM | POA: Insufficient documentation

## 2014-03-18 DIAGNOSIS — Z7982 Long term (current) use of aspirin: Secondary | ICD-10-CM | POA: Insufficient documentation

## 2014-03-18 DIAGNOSIS — Z79899 Other long term (current) drug therapy: Secondary | ICD-10-CM | POA: Diagnosis not present

## 2014-03-18 DIAGNOSIS — S023XXA Fracture of orbital floor, initial encounter for closed fracture: Secondary | ICD-10-CM | POA: Diagnosis present

## 2014-03-18 DIAGNOSIS — J449 Chronic obstructive pulmonary disease, unspecified: Secondary | ICD-10-CM | POA: Diagnosis not present

## 2014-03-18 DIAGNOSIS — Y998 Other external cause status: Secondary | ICD-10-CM | POA: Insufficient documentation

## 2014-03-18 DIAGNOSIS — S0100XA Unspecified open wound of scalp, initial encounter: Secondary | ICD-10-CM | POA: Diagnosis not present

## 2014-03-18 DIAGNOSIS — I5041 Acute combined systolic (congestive) and diastolic (congestive) heart failure: Secondary | ICD-10-CM | POA: Diagnosis not present

## 2014-03-18 DIAGNOSIS — I10 Essential (primary) hypertension: Secondary | ICD-10-CM | POA: Diagnosis not present

## 2014-03-18 DIAGNOSIS — F0390 Unspecified dementia without behavioral disturbance: Secondary | ICD-10-CM | POA: Diagnosis not present

## 2014-03-18 DIAGNOSIS — T148XXA Other injury of unspecified body region, initial encounter: Secondary | ICD-10-CM

## 2014-03-18 DIAGNOSIS — T1490XA Injury, unspecified, initial encounter: Secondary | ICD-10-CM

## 2014-03-18 DIAGNOSIS — E119 Type 2 diabetes mellitus without complications: Secondary | ICD-10-CM | POA: Diagnosis not present

## 2014-03-18 DIAGNOSIS — S59909A Unspecified injury of unspecified elbow, initial encounter: Secondary | ICD-10-CM | POA: Insufficient documentation

## 2014-03-18 DIAGNOSIS — Z8669 Personal history of other diseases of the nervous system and sense organs: Secondary | ICD-10-CM | POA: Insufficient documentation

## 2014-03-18 DIAGNOSIS — Z87891 Personal history of nicotine dependence: Secondary | ICD-10-CM | POA: Diagnosis not present

## 2014-03-18 DIAGNOSIS — S79919A Unspecified injury of unspecified hip, initial encounter: Secondary | ICD-10-CM | POA: Diagnosis not present

## 2014-03-18 DIAGNOSIS — W1830XA Fall on same level, unspecified, initial encounter: Secondary | ICD-10-CM | POA: Insufficient documentation

## 2014-03-18 DIAGNOSIS — Y9389 Activity, other specified: Secondary | ICD-10-CM | POA: Insufficient documentation

## 2014-03-18 DIAGNOSIS — F329 Major depressive disorder, single episode, unspecified: Secondary | ICD-10-CM | POA: Insufficient documentation

## 2014-03-18 DIAGNOSIS — Z8739 Personal history of other diseases of the musculoskeletal system and connective tissue: Secondary | ICD-10-CM | POA: Diagnosis not present

## 2014-03-18 DIAGNOSIS — S0285XA Fracture of orbit, unspecified, initial encounter for closed fracture: Secondary | ICD-10-CM

## 2014-03-18 DIAGNOSIS — S0280XA Fracture of other specified skull and facial bones, unspecified side, initial encounter for closed fracture: Secondary | ICD-10-CM

## 2014-03-18 DIAGNOSIS — Z8781 Personal history of (healed) traumatic fracture: Secondary | ICD-10-CM | POA: Diagnosis not present

## 2014-03-18 DIAGNOSIS — K219 Gastro-esophageal reflux disease without esophagitis: Secondary | ICD-10-CM | POA: Insufficient documentation

## 2014-03-18 DIAGNOSIS — W19XXXA Unspecified fall, initial encounter: Secondary | ICD-10-CM

## 2014-03-18 DIAGNOSIS — Z8673 Personal history of transient ischemic attack (TIA), and cerebral infarction without residual deficits: Secondary | ICD-10-CM | POA: Diagnosis not present

## 2014-03-18 DIAGNOSIS — Y92129 Unspecified place in nursing home as the place of occurrence of the external cause: Secondary | ICD-10-CM | POA: Diagnosis not present

## 2014-03-18 DIAGNOSIS — Z794 Long term (current) use of insulin: Secondary | ICD-10-CM | POA: Insufficient documentation

## 2014-03-18 DIAGNOSIS — S4992XA Unspecified injury of left shoulder and upper arm, initial encounter: Secondary | ICD-10-CM | POA: Insufficient documentation

## 2014-03-18 MED ORDER — HYDROCODONE-ACETAMINOPHEN 5-325 MG PO TABS
1.0000 | ORAL_TABLET | Freq: Once | ORAL | Status: AC
  Administered 2014-03-18: 1 via ORAL
  Filled 2014-03-18: qty 1

## 2014-03-18 NOTE — ED Notes (Signed)
Pt having difficulty standing to transfer to chair. Will call [facility] to transport home.

## 2014-03-18 NOTE — ED Notes (Signed)
Hospice RN at the bedside.

## 2014-03-18 NOTE — ED Notes (Signed)
Dr. nanavati at the bedside.  

## 2014-03-18 NOTE — ED Notes (Signed)
Pt states that she cannot get up and walk because she is in too much pain. Mayme GentaHayley, RN notified.

## 2014-03-18 NOTE — Progress Notes (Signed)
[facility] Francesco RunnerK Robertson RN Patient to the ED from home via EMS after a witnessed fall.  [facility] a [facility].  Patient seen at bedside, patient oriented to self, able to state she was in the hospital. Daughter Kennith Centerracey at bedside. Reports this is baseline, patient has dementia. Patient did report pain in her face, per chart review she does a have a displaced left orbital wall fracture and some injury to her left maxillary sinus. Other xrays negative. ED physician Dr. Rhunette CroftNanavati in during visit to talk with patient and her daughter to discuss head CT and xray results. Patient to be discharged home. Writer reqested that staff attempt to get patient up and to a  wheel chair as she will transport home via car. Writer discussed with Kennith Centerracey that if patient could not get up, or could not sit comfortably then she may need non emergent transport home. Patient does have a ramp at home, she is able, at baseline to take a few steps and pivot from her lift chair to the bed or [facility]. Per Kennith Centerracey patient has Vicodin and liqud morphine at home for treatment of pain. Educated and encouraged Kennith Centerracey to call [facility] with any questions or concerns at anytime, she voiced understanding.  Dayna BarkerKaren Robertson RN, BSN, [facility] Nurse Liaison [phone]

## 2014-03-18 NOTE — ED Notes (Signed)
Underneath the pts left eye the area is swollen and blue. This is a new finding.

## 2014-03-18 NOTE — ED Notes (Signed)
Patient unable to ambulate

## 2014-03-18 NOTE — ED Notes (Signed)
Called [facility] 

## 2014-03-18 NOTE — Discharge Instructions (Signed)
We saw you in the ER after you had a fall. All the imaging results are normal, EXCEPT - there is a fracture below your left eye. The fracture will heal on it's own. AVOID BLOWING NOSE.  No evidence of brain bleed. Please be very careful with walking, and do everything possible to prevent falls.  Return to the ER if there is new vision change, or inability to move your left eye. ICE the area well, 3-4 times a day.   Contusion A contusion is a deep bruise. Contusions are the result of an injury that caused bleeding under the skin. The contusion may turn blue, purple, or yellow. Minor injuries will give you a painless contusion, but more severe contusions may stay painful and swollen for a few weeks.  CAUSES  A contusion is usually caused by a blow, trauma, or direct force to an area of the body. SYMPTOMS   Swelling and redness of the injured area.  Bruising of the injured area.  Tenderness and soreness of the injured area.  Pain. DIAGNOSIS  The diagnosis can be made by taking a history and physical exam. An X-ray, CT scan, or MRI may be needed to determine if there were any associated injuries, such as fractures. TREATMENT  Specific treatment will depend on what area of the body was injured. In general, the best treatment for a contusion is resting, icing, elevating, and applying cold compresses to the injured area. Over-the-counter medicines may also be recommended for pain control. Ask your caregiver what the best treatment is for your contusion. HOME CARE INSTRUCTIONS   Put ice on the injured area.  Put ice in a plastic bag.  Place a towel between your skin and the bag.  Leave the ice on for 15-20 minutes, 3-4 times a day, or as directed by your health care provider.  Only take over-the-counter or prescription medicines for pain, discomfort, or fever as directed by your caregiver. Your caregiver may recommend avoiding anti-inflammatory medicines (aspirin, ibuprofen, and naproxen)  for 48 hours because these medicines may increase bruising.  Rest the injured area.  If possible, elevate the injured area to reduce swelling. SEEK IMMEDIATE MEDICAL CARE IF:   You have increased bruising or swelling.  You have pain that is getting worse.  Your swelling or pain is not relieved with medicines. MAKE SURE YOU:   Understand these instructions.  Will watch your condition.  Will get help right away if you are not doing well or get worse. Document Released: 11/28/2004 Document Revised: 02/23/2013 Document Reviewed: 12/24/2010 [facility] Patient Information 2015 [place]. This information is not intended to replace advice given to you by your health care provider. Make sure you discuss any questions you have with your health care provider. Orbital Floor Fracture, Blowout The eye sits in the bony structure of the skull called the orbit. The upper and outside walls of the orbit are very thick and strong. These walls protect the eye if the head is struck from the top or side of the eye. However, the inside wall near the nose and the orbit floor are very thin and weak. The bony floor of the orbit also acts as the roof of the air-filled space (sinus) below the orbit. If the eye receives a direct blow from the front, all the tissues around the eye are briefly pressed together. This makes the orbital wall pressure very high. Since the weakest walls tend to give way first, the inside wall or the orbit floor may  break. If the floor fractures, the tissues around the eye, including the muscle that is used to make the eye look down, may become trapped within the fracture as the floor of the orbit "blows out" into the sinus below.  CAUSES  Orbital floor fractures are caused by direct (blunt) trauma to the region of the eye. SYMPTOMS  Assuming that there has been no injury to the eye itself, symptoms can include:  Puffiness (swelling) and bruising around the eye area (black eye).  A  gurgling sound when pressure is placed on the eye area. This sound comes from air that has escaped from the sinus into the space around the eye (orbital emphysema).  Seeing two of everything - one object being higher than the other (vertical diplopia). This is the result of the muscle that moves the eye down being trapped within the fracture. Since it cannot relax, the eye is being held in a downward position relative to the other eye and cannot look up. Vertical diplopia from an orbital floor fracture is worse when looking up.  Pain around the eye when looking up.  One eye looks sunken compared to the other eye (enophthalmos).  Numbness of the cheek and upper gum on the same side of the face with the floor fracture. This is a result of nerve injury to these areas. This nerve runs in a groove along the bone of the orbital floor on its way to the cheek and upper gums. DIAGNOSIS  The diagnosis of an orbital floor fracture is suspected during an eye exam by an ophthalmologist. It is confirmed by X-rays or CT scan of the eye region. TREATMENT   Orbital floor fractures are not usually treated until all of the swelling around the eye has gone away. This may take 1 or 2 weeks. Once the swelling has gone down, an ophthalmologist will see if if the muscle below the eye is still trapped within the fracture.  If there is no sign of a trapped muscle or vertical diplopia, treatment is not necessary.  If there is double vision only when looking up, a decision may be made to not do anything since most people do not spend a lot of time looking up. This may depend on the person's profession. For instance, a Nutritional therapist or electrician may spend a large part of their day looking up and would therefore need treatment.  If there is persistent vertical double vision even when looking straight ahead, the ophthalmologist may try to free the muscle in the office. If this is unsuccessful, surgery is often needed. SEEK  IMMEDIATE MEDICAL CARE IF:  You have had a blow to the region of your eyes and have:  A drop in vision in either eye.  Swelling and bruising around either eye.  One eye seems to be "sunken" compared to the other.  You see two of everything with both eyes open when looking in any direction.  The two images get further apart when looking in a certain direction - especially up.  You have numbness of the cheek and upper gums on the side of the injury.  You develop an unexplained oral temperature over 102 F (38.9 C), or as your caregiver suggests. Document Released: 08/14/2000 Document Revised: 05/13/2011 Document Reviewed: 04/22/2013 [facility] Patient Information 2015 [place], [place]. This information is not intended to replace advice given to you by your health care provider. Make sure you discuss any questions you have with your health care provider.

## 2014-03-18 NOTE — ED Notes (Signed)
Pt from home via [facility] with c/o of witnesses loss of balance fall, with neck pain initally, and left elbow pain.  Pt reports chronic pelvis pain.  No LOC, did not hit head, no deformity noted, not on blood thinners.  Pt normally on 2L nasal cannula, hx of dementia and at baseline per family.  Pt in NAD, alert.

## 2014-03-18 NOTE — ED Provider Notes (Addendum)
CSN: [id number]     Arrival date & time 03/18/14  1052 History   First MD Initiated Contact with Patient 03/18/14 1134     Chief Complaint  Patient presents with  . Fall  . Elbow Pain     (Consider location/radiation/quality/duration/timing/severity/associated sxs/prior Treatment) HPI Comments: Pt with hx of COPD, CHF, on hospice due to COPD comes in with cc of fall. Pt had a mechanical fall, witnessed by daughter. Pt complains of headache, L shoulder and hip pain. Pt also has elbow pain. Pt has dementia, but is oriented to self and location, and providing some meaningful history. Daughter would appreciate proper workup for the fall, she has POA. Pt is not on blood thinners.  Patient is a 81 y.o. female presenting with fall. The history is provided by the patient and a relative.  Fall Associated symptoms include headaches. Pertinent negatives include no chest pain, no abdominal pain and no shortness of breath.    Past Medical History  Diagnosis Date  . Diabetes mellitus type II   . Hyperlipidemia   . HTN (hypertension)   . GERD (gastroesophageal reflux disease)   . Allergic rhinitis   . Depression   . Dementia     mild  . History of CVA (cerebrovascular accident)   . LBBB (left bundle branch block)   . AAA (abdominal aortic aneurysm)     small, last check in 05/2009, due for repeat in 05/2010  . Carotid stenosis 09/2009    40-59% RICA, 0-39% LICA  . LVH (left ventricular hypertrophy) 10/2009    Moderate, EF 55-60%, no regional wall motion abnormalities  . Shellfish allergy     anaphylaxis  . Osteopenia   . Vitamin D deficiency   . Fracture of rib of left side 12/2009  . Asthma 01/27/2011  . Diastolic CHF 08/02/2011  . AAA (abdominal aortic aneurysm) without rupture 07/16/2013  . Cataract    Past Surgical History  Procedure Laterality Date  . Ankle surgery      Right, [place] in [place]  . Tracheostomy  2010    anaphylactic episode on vent  . Tubal ligation    . Joint  replacement Left     knee   Family History  Problem Relation Age of Onset  . Rheum arthritis Sister   . Stroke Brother   . Hypertension Brother   . Diabetes type II Other     2 siblings   History  Substance Use Topics  . Smoking status: Former Smoker    Quit date: 12/17/2010  . Smokeless tobacco: Not on file     Comment: 3/4 ppd  . Alcohol Use: No   OB History    No data available     Review of Systems  Constitutional: Negative for activity change.  Respiratory: Negative for shortness of breath.   Cardiovascular: Negative for chest pain.  Gastrointestinal: Negative for nausea, vomiting and abdominal pain.  Genitourinary: Negative for dysuria.  Musculoskeletal: Positive for myalgias and arthralgias. Negative for neck pain.  Allergic/Immunologic: Negative for immunocompromised state.  Neurological: Positive for headaches.  Hematological: Does not bruise/bleed easily.      Allergies  Ibuprofen; Shellfish-derived products; Ace inhibitors; and Sulfa antibiotics  Home Medications   Prior to Admission medications   Medication Sig Start Date End Date Taking? Authorizing Provider  albuterol (PROVENTIL HFA;VENTOLIN HFA) 108 (90 BASE) MCG/ACT inhaler Inhale 2 puffs into the lungs every 6 (six) hours as needed for wheezing or shortness of breath. 01/04/14  Corwin Levins, MD  aspirin 81 MG EC tablet Take 81 mg by mouth every morning.     Historical Provider, MD  citalopram (CELEXA) 20 MG tablet Take 1 tablet (20 mg total) by mouth every morning. 12/14/13   Corwin Levins, MD  donepezil (ARICEPT) 10 MG tablet Take 1 tablet (10 mg total) by mouth daily. 12/14/13   Corwin Levins, MD  doxycycline (VIBRA-TABS) 100 MG tablet Take 1 tablet (100 mg total) by mouth every 12 (twelve) hours. Patient not taking: Reported on 03/05/2014 01/13/14   Joseph Art, DO  famotidine (PEPCID) 40 MG tablet Take 40 mg by mouth daily.    Historical Provider, MD  fluticasone (FLONASE) 50 MCG/ACT nasal spray  Place 2 sprays into both nostrils daily. Patient not taking: Reported on 03/05/2014 12/20/13   Richarda Overlie, MD  furosemide (LASIX) 20 MG tablet Take 1 tablet (20 mg total) by mouth daily. 12/20/13   Richarda Overlie, MD  guaiFENesin-dextromethorphan (ROBITUSSIN DM) 100-10 MG/5ML syrup Take 5 mLs by mouth every 4 (four) hours as needed for cough. 01/13/14   Joseph Art, DO  haloperidol (HALDOL) 2 MG/ML solution Take 4 mg by mouth 2 (two) times daily.    Historical Provider, MD  HYDROcodone-acetaminophen (NORCO/VICODIN) 5-325 MG per tablet Take 1 tablet by mouth every 6 (six) hours as needed for moderate pain.    Historical Provider, MD  insulin glargine (LANTUS) 100 UNIT/ML injection Inject 0.25 mLs (25 Units total) into the skin at bedtime. 03/25/13   Corwin Levins, MD  ipratropium-albuterol (DUONEB) 0.5-2.5 (3) MG/3ML SOLN Take 3 mLs by nebulization every 6 (six) hours as needed (shortness of breath).    Historical Provider, MD  labetalol (NORMODYNE) 200 MG tablet Take 1 tablet (200 mg total) by mouth 2 (two) times daily. 12/14/13   Corwin Levins, MD  levalbuterol [facility] HFA) 45 MCG/ACT inhaler Inhale 2 puffs into the lungs every 6 (six) hours as needed for wheezing. Patient not taking: Reported on 03/05/2014 12/20/13   Richarda Overlie, MD  loperamide (IMODIUM A-D) 2 MG tablet Take 2 mg by mouth as needed for diarrhea or loose stools.    Historical Provider, MD  montelukast (SINGULAIR) 10 MG tablet Take 1 tablet (10 mg total) by mouth at bedtime. 12/14/13   Corwin Levins, MD  Morphine Sulfate (MORPHINE CONCENTRATE) 10 MG/0.5ML SOLN concentrated solution Take 0.25 mLs (5 mg total) by mouth every 2 (two) hours as needed for shortness of breath. 01/13/14   Joseph Art, DO  NIFEdipine (PROCARDIA-XL/ADALAT-CC/NIFEDICAL-XL) 30 MG 24 hr tablet Take 1 tablet (30 mg total) by mouth every morning. 03/25/13   Corwin Levins, MD  omeprazole (PRILOSEC) 20 MG capsule Take 1 capsule (20 mg total) by mouth daily. 04/09/13    Corwin Levins, MD  ondansetron (ZOFRAN) 4 MG tablet Take 4 mg by mouth every 8 (eight) hours as needed for nausea or vomiting.    Historical Provider, MD  predniSONE (DELTASONE) 10 MG tablet 30 mg x 2 days, 20 mg x 2 days, 10 mg x 2 days then d/c Patient not taking: Reported on 03/05/2014 01/13/14   Joseph Art, DO  QUEtiapine (SEROQUEL) 50 MG tablet Take 1 tablet (50 mg total) by mouth at bedtime. 01/04/14   Corwin Levins, MD  sorbitol 70 % SOLN Take 30 mLs by mouth daily as needed for mild constipation. 01/13/14   Joseph Art, DO   BP 127/47 mmHg  Pulse 71  Temp(Src) 97.6 F (36.4 C) (Oral)  Resp 22  SpO2 100% Physical Exam  Constitutional: She appears well-developed.  HENT:  Head: Normocephalic.  Eyes: Conjunctivae are normal.  L periotnital ecchymoses, however, EOMI.  Neck: No JVD present.  No midline c-spine tenderness, pt able to turn head to 45 degrees bilaterally without any pain and able to flex neck to the chest and extend without any pain or neurologic symptoms.   Cardiovascular: Normal rate.   Pulmonary/Chest: Effort normal. No respiratory distress. She has no wheezes.  Abdominal: Soft. She exhibits no distension. There is no tenderness.  Musculoskeletal:  BESIDES L periorbital hematoma:  Head to toe evaluation shows no hematoma, bleeding of the scalp, no facial abrasions, step offs, crepitus, no tenderness to palpation of the bilateral upper and lower extremities, no gross deformities, no chest tenderness, no pelvic pain. Hip and elbow ROM intact.   Nursing note and vitals reviewed.   ED Course  Procedures (including critical care time) Labs Review Labs Reviewed - No data to display  Imaging Review Dg Pelvis 1-2 Views  03/18/2014   CLINICAL DATA:  Status post fall now with left-sided pelvic pain  EXAM: PELVIS - 1-2 VIEW  COMPARISON:  AP view the pelvis of December 27, 2012  FINDINGS: The bony pelvis is osteopenic. There is chronic irregularity of the right  superior and inferior pubic rami consistent with previous fractures. The left pubic rami are intact. The hip joint spaces are preserved. The observed portions of the sacrum are unremarkable. The femoral heads, necks, and intertrochanteric regions are normal in appearance. The soft tissues of the pelvis are unremarkable.  IMPRESSION: No acute pelvic fracture is demonstrated. Old deformity of the pubic rami on the right is consistent with previous fractures.   Electronically Signed   By: David  [place]   On: 03/18/2014 13:04   Dg Elbow Complete Left  03/18/2014   CLINICAL DATA:  Fall.  Pain left elbow.  Initial evaluation .  EXAM: LEFT ELBOW - COMPLETE 3+ VIEW  COMPARISON:  None.  FINDINGS: There is no evidence of fracture, dislocation, or joint effusion. There is no evidence of arthropathy or other focal bone abnormality. Soft tissues are unremarkable.  IMPRESSION: No acute bony or joint abnormality. No evidence of fracture or dislocation.   Electronically Signed   By: Maisie Fus  Register   On: 03/18/2014 13:08   Ct Head Wo Contrast  03/18/2014   CLINICAL DATA:  Weakness fall.  No loss of consciousness.  EXAM: CT HEAD WITHOUT CONTRAST  TECHNIQUE: Contiguous axial images were obtained from the base of the skull through the vertex without intravenous contrast.  COMPARISON:  CT scan of June 04, 2011.  FINDINGS: Fluid is noted in right mastoid air cells. Mildly displaced fractures are seen involving the lateral wall of the left maxillary sinus with probable hemorrhage within the sinus. Also noted is mildly displaced fracture involving the left zygomatic arch and left lateral orbital wall.  Mild diffuse cortical atrophy is noted. Moderate chronic ischemic white matter disease is noted. No mass effect or midline shift is noted. Ventricular size is within normal limits. There is no evidence of mass lesion, hemorrhage or acute infarction.  IMPRESSION: Mild diffuse cortical atrophy. Moderate chronic ischemic white matter  disease. No acute intracranial abnormality seen.  Mildly displaced fractures are seen involving the lateral wall of the left maxillary sinus and zygomatic arch, with hemorrhage present within the left maxillary sinus. Mildly displaced left orbital wall fracture is noted as well.  Electronically Signed   By: Roque Lias M.D.   On: 03/18/2014 12:49   Dg Shoulder Left  03/18/2014   CLINICAL DATA:  Status post fall  EXAM: LEFT SHOULDER - 2+ VIEW  COMPARISON:  Left shoulder series of April 21, 2012  FINDINGS: The bones of the shoulder are osteopenic. The glenohumeral joint is unremarkable. There are mild degenerative changes of the [facility] joint. The observed portions of the scapula, left clavicle, and upper left ribs are normal.  IMPRESSION: There is diffuse osteopenia. There is no acute bony abnormality of the left shoulder.   Electronically Signed   By: David  [place]   On: 03/18/2014 13:05     EKG Interpretation None      MDM   Final diagnoses:  Fall  Contusion  Orbital wall fracture, closed, initial encounter    DDx includes: - Mechanical falls - ICH - Fractures - Contusions - Soft tissue injury  Pt comes in post fall. Pt appears to have had a mechanical fall. CT done - she has L orbital wall fx,. No CSF rhinorrhea, ororrhea - and CT face was not ordered, as pt also had normal extraocular movement. PERRL here. Pt will be ambulated, which is she does well, she will be discharged.   Derwood Kaplan, MD 03/18/14 1400  3:27 PM BLow out fx seen, findings discussed with pt and family and the Hospice RN. [place] meds at home. Eye exam is still normal.  Derwood Kaplan, MD 03/18/14 1527

## 2014-04-13 ENCOUNTER — Other Ambulatory Visit: Payer: Self-pay | Admitting: Internal Medicine

## 2014-05-16 ENCOUNTER — Telehealth: Payer: Self-pay

## 2014-05-16 NOTE — Telephone Encounter (Signed)
[facility] 05/16/14 for Dr. Jonny RuizJohn.  Will take to Primary Care and leave for signature 05/17/14 as he is out of office today.  Took to Primary Care 05/17/14 for signature.  Received back 05/18/14.  Called [facility] for pick-up.

## 2014-06-03 DEATH — deceased
